# Patient Record
Sex: Female | Born: 1980 | Hispanic: Yes | Marital: Married | Smoking: Never smoker
Health system: Southern US, Community
[De-identification: ages and names within clinical notes are randomized; demographics above are authoritative.]

## PROBLEM LIST (undated history)

## (undated) ENCOUNTER — Inpatient Hospital Stay (HOSPITAL_COMMUNITY): Payer: Self-pay

## (undated) DIAGNOSIS — E119 Type 2 diabetes mellitus without complications: Secondary | ICD-10-CM

## (undated) DIAGNOSIS — N883 Incompetence of cervix uteri: Secondary | ICD-10-CM

## (undated) DIAGNOSIS — O24419 Gestational diabetes mellitus in pregnancy, unspecified control: Secondary | ICD-10-CM

## (undated) HISTORY — DX: Gestational diabetes mellitus in pregnancy, unspecified control: O24.419

## (undated) HISTORY — PX: CHOLECYSTECTOMY: SHX55

---

## 2008-01-20 ENCOUNTER — Inpatient Hospital Stay (HOSPITAL_COMMUNITY): Admission: AD | Admit: 2008-01-20 | Discharge: 2008-01-21 | Payer: Self-pay | Admitting: Obstetrics & Gynecology

## 2008-04-04 ENCOUNTER — Inpatient Hospital Stay (HOSPITAL_COMMUNITY): Admission: AD | Admit: 2008-04-04 | Discharge: 2008-04-04 | Payer: Self-pay | Admitting: Obstetrics & Gynecology

## 2008-05-16 ENCOUNTER — Inpatient Hospital Stay (HOSPITAL_COMMUNITY): Admission: AD | Admit: 2008-05-16 | Discharge: 2008-05-16 | Payer: Self-pay | Admitting: Family Medicine

## 2008-05-16 ENCOUNTER — Ambulatory Visit: Payer: Self-pay | Admitting: Advanced Practice Midwife

## 2008-10-09 ENCOUNTER — Ambulatory Visit: Payer: Self-pay | Admitting: Advanced Practice Midwife

## 2008-10-09 ENCOUNTER — Inpatient Hospital Stay (HOSPITAL_COMMUNITY): Admission: AD | Admit: 2008-10-09 | Discharge: 2008-10-10 | Payer: Self-pay | Admitting: Obstetrics and Gynecology

## 2008-10-18 ENCOUNTER — Inpatient Hospital Stay (HOSPITAL_COMMUNITY): Admission: RE | Admit: 2008-10-18 | Discharge: 2008-10-18 | Payer: Self-pay | Admitting: Obstetrics and Gynecology

## 2008-10-18 ENCOUNTER — Ambulatory Visit: Payer: Self-pay | Admitting: Obstetrics and Gynecology

## 2008-10-25 ENCOUNTER — Inpatient Hospital Stay (HOSPITAL_COMMUNITY): Admission: AD | Admit: 2008-10-25 | Discharge: 2008-10-25 | Payer: Self-pay | Admitting: Obstetrics & Gynecology

## 2008-10-28 ENCOUNTER — Inpatient Hospital Stay (HOSPITAL_COMMUNITY): Admission: AD | Admit: 2008-10-28 | Discharge: 2008-10-28 | Payer: Self-pay | Admitting: Obstetrics & Gynecology

## 2008-11-04 ENCOUNTER — Inpatient Hospital Stay (HOSPITAL_COMMUNITY): Admission: AD | Admit: 2008-11-04 | Discharge: 2008-11-04 | Payer: Self-pay | Admitting: Obstetrics and Gynecology

## 2009-03-26 ENCOUNTER — Emergency Department (HOSPITAL_COMMUNITY): Admission: EM | Admit: 2009-03-26 | Discharge: 2009-03-26 | Payer: Self-pay | Admitting: Emergency Medicine

## 2009-04-04 ENCOUNTER — Ambulatory Visit (HOSPITAL_COMMUNITY): Admission: EM | Admit: 2009-04-04 | Discharge: 2009-04-04 | Payer: Self-pay | Admitting: Emergency Medicine

## 2009-04-04 ENCOUNTER — Encounter (INDEPENDENT_AMBULATORY_CARE_PROVIDER_SITE_OTHER): Payer: Self-pay

## 2009-10-13 ENCOUNTER — Inpatient Hospital Stay (HOSPITAL_COMMUNITY): Admission: AD | Admit: 2009-10-13 | Discharge: 2009-10-13 | Payer: Self-pay | Admitting: Obstetrics

## 2009-11-24 ENCOUNTER — Inpatient Hospital Stay (HOSPITAL_COMMUNITY): Admission: AD | Admit: 2009-11-24 | Discharge: 2009-11-24 | Payer: Self-pay | Admitting: Obstetrics

## 2009-11-24 ENCOUNTER — Ambulatory Visit: Payer: Self-pay | Admitting: Obstetrics and Gynecology

## 2009-12-26 ENCOUNTER — Inpatient Hospital Stay (HOSPITAL_COMMUNITY)
Admission: AD | Admit: 2009-12-26 | Discharge: 2009-12-30 | Payer: Self-pay | Source: Home / Self Care | Admitting: Obstetrics

## 2009-12-30 ENCOUNTER — Encounter (INDEPENDENT_AMBULATORY_CARE_PROVIDER_SITE_OTHER): Payer: Self-pay | Admitting: Obstetrics

## 2010-01-16 ENCOUNTER — Inpatient Hospital Stay (HOSPITAL_COMMUNITY)
Admission: AD | Admit: 2010-01-16 | Discharge: 2010-01-16 | Payer: Self-pay | Source: Home / Self Care | Admitting: Obstetrics and Gynecology

## 2010-01-16 ENCOUNTER — Ambulatory Visit: Payer: Self-pay | Admitting: Nurse Practitioner

## 2010-01-17 DEATH — deceased

## 2010-05-31 LAB — URINE MICROSCOPIC-ADD ON

## 2010-05-31 LAB — CBC
Hemoglobin: 13.6 g/dL (ref 12.0–15.0)
Platelets: 304 10*3/uL (ref 150–400)
RBC: 4.45 MIL/uL (ref 3.87–5.11)
WBC: 8.5 10*3/uL (ref 4.0–10.5)

## 2010-05-31 LAB — COMPREHENSIVE METABOLIC PANEL
ALT: 46 U/L — ABNORMAL HIGH (ref 0–35)
AST: 55 U/L — ABNORMAL HIGH (ref 0–37)
Albumin: 3.9 g/dL (ref 3.5–5.2)
CO2: 24 mEq/L (ref 19–32)
Chloride: 104 mEq/L (ref 96–112)
GFR calc Af Amer: >60 mL/min (ref >60)
GFR calc non Af Amer: >60 mL/min (ref >60)
Sodium: 135 mEq/L (ref 135–145)
Total Bilirubin: 0.5 mg/dL (ref 0.3–1.2)

## 2010-05-31 LAB — URINALYSIS, ROUTINE W REFLEX MICROSCOPIC
Bilirubin Urine: NEGATIVE
Glucose, UA: NEGATIVE mg/dL
Specific Gravity, Urine: <1.005 — ABNORMAL LOW (ref 1.005–1.030)
pH: 5.5 (ref 5.0–8.0)

## 2010-06-01 LAB — CBC
HCT: 31.2 % — ABNORMAL LOW (ref 36.0–46.0)
HCT: 37.9 % (ref 36.0–46.0)
Hemoglobin: 10.9 g/dL — ABNORMAL LOW (ref 12.0–15.0)
Hemoglobin: 13 g/dL (ref 12.0–15.0)
MCHC: 34.4 g/dL (ref 30.0–36.0)
RBC: 3.39 MIL/uL — ABNORMAL LOW (ref 3.87–5.11)
RBC: 4.15 MIL/uL (ref 3.87–5.11)

## 2010-06-01 LAB — URINE MICROSCOPIC-ADD ON

## 2010-06-01 LAB — URINALYSIS, ROUTINE W REFLEX MICROSCOPIC
Bilirubin Urine: NEGATIVE
Glucose, UA: NEGATIVE mg/dL
Hgb urine dipstick: NEGATIVE
Ketones, ur: NEGATIVE mg/dL
Nitrite: NEGATIVE
Specific Gravity, Urine: <1.005 — ABNORMAL LOW (ref 1.005–1.030)
Specific Gravity, Urine: <1.005 — ABNORMAL LOW (ref 1.005–1.030)
Urobilinogen, UA: 0.2 mg/dL (ref 0.0–1.0)
pH: 6.5 (ref 5.0–8.0)
pH: 6.5 (ref 5.0–8.0)

## 2010-06-01 LAB — ABO/RH: ABO/RH(D): O POS

## 2010-06-01 LAB — MAGNESIUM: Magnesium: 5.6 mg/dL — ABNORMAL HIGH (ref 1.5–2.5)

## 2010-06-03 LAB — URINALYSIS, ROUTINE W REFLEX MICROSCOPIC
Glucose, UA: NEGATIVE mg/dL
Ketones, ur: NEGATIVE mg/dL
Leukocytes, UA: NEGATIVE
Specific Gravity, Urine: <1.005 — ABNORMAL LOW (ref 1.005–1.030)
pH: 6 (ref 5.0–8.0)

## 2010-06-03 LAB — GC/CHLAMYDIA PROBE AMP, GENITAL
Chlamydia, DNA Probe: NEGATIVE
GC Probe Amp, Genital: NEGATIVE

## 2010-06-03 LAB — URINE MICROSCOPIC-ADD ON

## 2010-06-04 LAB — COMPREHENSIVE METABOLIC PANEL
AST: 22 U/L (ref 0–37)
Albumin: 4.1 g/dL (ref 3.5–5.2)
Chloride: 104 mEq/L (ref 96–112)
Creatinine, Ser: 0.52 mg/dL (ref 0.4–1.2)
GFR calc Af Amer: >60 mL/min (ref >60)
Potassium: 3.8 mEq/L (ref 3.5–5.1)
Total Bilirubin: 0.5 mg/dL (ref 0.3–1.2)
Total Protein: 7.6 g/dL (ref 6.0–8.3)

## 2010-06-04 LAB — URINE MICROSCOPIC-ADD ON

## 2010-06-04 LAB — CBC
MCV: 89.4 fL (ref 78.0–100.0)
Platelets: 222 10*3/uL (ref 150–400)
RDW: 12.6 % (ref 11.5–15.5)
WBC: 8.5 10*3/uL (ref 4.0–10.5)

## 2010-06-04 LAB — URINALYSIS, ROUTINE W REFLEX MICROSCOPIC
Bilirubin Urine: NEGATIVE
Hgb urine dipstick: NEGATIVE
Specific Gravity, Urine: 1.022 (ref 1.005–1.030)
pH: 6 (ref 5.0–8.0)

## 2010-06-04 LAB — DIFFERENTIAL
Basophils Absolute: 0 10*3/uL (ref 0.0–0.1)
Eosinophils Relative: 1 % (ref 0–5)
Lymphocytes Relative: 24 % (ref 12–46)
Monocytes Absolute: 0.5 10*3/uL (ref 0.1–1.0)
Monocytes Relative: 5 % (ref 3–12)

## 2010-06-04 LAB — POCT PREGNANCY, URINE: Preg Test, Ur: NEGATIVE

## 2010-06-05 LAB — CBC
HCT: 38.5 % (ref 36.0–46.0)
Hemoglobin: 13.5 g/dL (ref 12.0–15.0)
MCHC: 35 g/dL (ref 30.0–36.0)
MCV: 88.4 fL (ref 78.0–100.0)
RBC: 4.36 MIL/uL (ref 3.87–5.11)
WBC: 8.9 10*3/uL (ref 4.0–10.5)

## 2010-06-05 LAB — COMPREHENSIVE METABOLIC PANEL
AST: 23 U/L (ref 0–37)
BUN: 11 mg/dL (ref 6–23)
CO2: 25 mEq/L (ref 19–32)
Calcium: 9.2 mg/dL (ref 8.4–10.5)
Chloride: 105 mEq/L (ref 96–112)
Creatinine, Ser: 0.47 mg/dL (ref 0.4–1.2)
GFR calc non Af Amer: >60 mL/min (ref >60)
Glucose, Bld: 107 mg/dL — ABNORMAL HIGH (ref 70–99)
Total Bilirubin: 0.5 mg/dL (ref 0.3–1.2)

## 2010-06-05 LAB — POCT PREGNANCY, URINE: Preg Test, Ur: NEGATIVE

## 2010-06-05 LAB — URINALYSIS, ROUTINE W REFLEX MICROSCOPIC
Bilirubin Urine: NEGATIVE
Glucose, UA: NEGATIVE mg/dL
Hgb urine dipstick: NEGATIVE
Specific Gravity, Urine: 1.025 (ref 1.005–1.030)
Urobilinogen, UA: 0.2 mg/dL (ref 0.0–1.0)
pH: 6.5 (ref 5.0–8.0)

## 2010-06-05 LAB — DIFFERENTIAL
Basophils Absolute: 0 10*3/uL (ref 0.0–0.1)
Eosinophils Absolute: 0.2 10*3/uL (ref 0.0–0.7)
Eosinophils Relative: 3 % (ref 0–5)
Lymphocytes Relative: 23 % (ref 12–46)
Lymphs Abs: 2.1 10*3/uL (ref 0.7–4.0)
Neutrophils Relative %: 69 % (ref 43–77)

## 2010-06-05 LAB — LIPASE, BLOOD: Lipase: 24 U/L (ref 11–59)

## 2010-06-24 ENCOUNTER — Inpatient Hospital Stay (HOSPITAL_COMMUNITY): Payer: Self-pay

## 2010-06-24 ENCOUNTER — Inpatient Hospital Stay (HOSPITAL_COMMUNITY)
Admission: AD | Admit: 2010-06-24 | Discharge: 2010-06-24 | Disposition: A | Discharge status: Home / Self Care | Payer: Self-pay | Source: Ambulatory Visit | Attending: Obstetrics & Gynecology | Admitting: Obstetrics & Gynecology

## 2010-06-24 DIAGNOSIS — O036 Delayed or excessive hemorrhage following complete or unspecified spontaneous abortion: Secondary | ICD-10-CM

## 2010-06-24 DIAGNOSIS — O209 Hemorrhage in early pregnancy, unspecified: Secondary | ICD-10-CM | POA: Insufficient documentation

## 2010-06-24 LAB — URINALYSIS, ROUTINE W REFLEX MICROSCOPIC
Bilirubin Urine: NEGATIVE
Glucose, UA: NEGATIVE mg/dL
Glucose, UA: NEGATIVE mg/dL
Hgb urine dipstick: NEGATIVE
Nitrite: NEGATIVE
Specific Gravity, Urine: 1.02 (ref 1.005–1.030)
pH: 6.5 (ref 5.0–8.0)
pH: 6.5 (ref 5.0–8.0)

## 2010-06-24 LAB — CBC
HCT: 41 % (ref 36.0–46.0)
HCT: 38.5 % (ref 36.0–46.0)
Hemoglobin: 13.4 g/dL (ref 12.0–15.0)
MCHC: 34.1 g/dL (ref 30.0–36.0)
MCV: 88.4 fL (ref 78.0–100.0)
MCV: 91.7 fL (ref 78.0–100.0)
Platelets: 224 10*3/uL (ref 150–400)
RBC: 4.17 MIL/uL (ref 3.87–5.11)
RBC: 4.2 MIL/uL (ref 3.87–5.11)
RDW: 13.3 % (ref 11.5–15.5)
WBC: 11.5 10*3/uL — ABNORMAL HIGH (ref 4.0–10.5)
WBC: 8.9 10*3/uL (ref 4.0–10.5)
WBC: 9.7 10*3/uL (ref 4.0–10.5)

## 2010-06-24 LAB — URINE MICROSCOPIC-ADD ON

## 2010-06-24 LAB — DIFFERENTIAL
Basophils Relative: 0 % (ref 0–1)
Lymphocytes Relative: 25 % (ref 12–46)
Lymphs Abs: 2.2 10*3/uL (ref 0.7–4.0)
Monocytes Relative: 8 % (ref 3–12)
Neutro Abs: 5.8 10*3/uL (ref 1.7–7.7)
Neutrophils Relative %: 65 % (ref 43–77)

## 2010-06-24 LAB — HCG, QUANTITATIVE, PREGNANCY: hCG, Beta Chain, Quant, S: 75 m[IU]/mL — ABNORMAL HIGH (ref <5)

## 2010-06-24 LAB — POCT PREGNANCY, URINE: Preg Test, Ur: POSITIVE

## 2010-06-25 LAB — URINALYSIS, ROUTINE W REFLEX MICROSCOPIC
Ketones, ur: NEGATIVE mg/dL
Nitrite: NEGATIVE
Specific Gravity, Urine: <1.005 — ABNORMAL LOW (ref 1.005–1.030)
pH: 6 (ref 5.0–8.0)

## 2010-06-25 LAB — WET PREP, GENITAL: Yeast Wet Prep HPF POC: NONE SEEN

## 2010-06-25 LAB — HCG, QUANTITATIVE, PREGNANCY: hCG, Beta Chain, Quant, S: 31188 m[IU]/mL — ABNORMAL HIGH (ref <5)

## 2010-06-26 LAB — GC/CHLAMYDIA PROBE AMP, GENITAL: GC Probe Amp, Genital: NEGATIVE

## 2010-07-03 LAB — URINALYSIS, ROUTINE W REFLEX MICROSCOPIC
Bilirubin Urine: NEGATIVE
Glucose, UA: NEGATIVE mg/dL
Specific Gravity, Urine: <1.005 — ABNORMAL LOW (ref 1.005–1.030)
pH: 6.5 (ref 5.0–8.0)

## 2010-07-03 LAB — GC/CHLAMYDIA PROBE AMP, GENITAL: GC Probe Amp, Genital: NEGATIVE

## 2010-07-03 LAB — CBC
HCT: 34.9 % — ABNORMAL LOW (ref 36.0–46.0)
Hemoglobin: 11.9 g/dL — ABNORMAL LOW (ref 12.0–15.0)
RBC: 3.96 MIL/uL (ref 3.87–5.11)
WBC: 9.2 10*3/uL (ref 4.0–10.5)

## 2010-07-03 LAB — URINE MICROSCOPIC-ADD ON

## 2010-07-03 LAB — WET PREP, GENITAL
Trich, Wet Prep: NONE SEEN
Yeast Wet Prep HPF POC: NONE SEEN

## 2010-07-04 LAB — URINE MICROSCOPIC-ADD ON

## 2010-07-04 LAB — URINALYSIS, ROUTINE W REFLEX MICROSCOPIC
Glucose, UA: NEGATIVE mg/dL
Leukocytes, UA: NEGATIVE
Nitrite: NEGATIVE
pH: 6 (ref 5.0–8.0)

## 2010-07-05 ENCOUNTER — Other Ambulatory Visit: Payer: Self-pay | Admitting: Obstetrics and Gynecology

## 2010-07-05 DIAGNOSIS — Z331 Pregnant state, incidental: Secondary | ICD-10-CM

## 2010-07-05 LAB — POCT URINALYSIS DIP (DEVICE)
Bilirubin Urine: NEGATIVE
Hgb urine dipstick: NEGATIVE
Nitrite: NEGATIVE
Specific Gravity, Urine: 1.01 (ref 1.005–1.030)
Urobilinogen, UA: 0.2 mg/dL (ref 0.0–1.0)
pH: 5 (ref 5.0–8.0)

## 2010-07-20 ENCOUNTER — Other Ambulatory Visit: Payer: Self-pay | Admitting: Obstetrics and Gynecology

## 2010-07-20 DIAGNOSIS — Z331 Pregnant state, incidental: Secondary | ICD-10-CM

## 2010-07-20 DIAGNOSIS — O09299 Supervision of pregnancy with other poor reproductive or obstetric history, unspecified trimester: Secondary | ICD-10-CM

## 2010-07-20 LAB — POCT URINALYSIS DIP (DEVICE)
Hgb urine dipstick: NEGATIVE
Ketones, ur: NEGATIVE mg/dL
Protein, ur: NEGATIVE mg/dL
pH: 7 (ref 5.0–8.0)

## 2010-07-24 ENCOUNTER — Ambulatory Visit (HOSPITAL_COMMUNITY)
Admission: RE | Admit: 2010-07-24 | Discharge: 2010-07-24 | Disposition: A | Discharge status: Home / Self Care | Payer: Self-pay | Source: Ambulatory Visit

## 2010-07-24 ENCOUNTER — Other Ambulatory Visit: Payer: Self-pay | Admitting: Obstetrics and Gynecology

## 2010-07-24 ENCOUNTER — Ambulatory Visit (HOSPITAL_COMMUNITY)
Admission: RE | Admit: 2010-07-24 | Discharge: 2010-07-24 | Disposition: A | Discharge status: Home / Self Care | Payer: Self-pay | Source: Ambulatory Visit | Attending: Obstetrics and Gynecology | Admitting: Obstetrics and Gynecology

## 2010-07-24 ENCOUNTER — Ambulatory Visit (HOSPITAL_COMMUNITY): Admission: RE | Admit: 2010-07-24 | Payer: Self-pay | Source: Ambulatory Visit

## 2010-07-24 DIAGNOSIS — O2620 Pregnancy care for patient with recurrent pregnancy loss, unspecified trimester: Secondary | ICD-10-CM | POA: Insufficient documentation

## 2010-07-24 DIAGNOSIS — Z362 Encounter for other antenatal screening follow-up: Secondary | ICD-10-CM

## 2010-07-24 DIAGNOSIS — O26879 Cervical shortening, unspecified trimester: Secondary | ICD-10-CM

## 2010-07-24 DIAGNOSIS — Z3689 Encounter for other specified antenatal screening: Secondary | ICD-10-CM | POA: Insufficient documentation

## 2010-07-24 DIAGNOSIS — Z0489 Encounter for examination and observation for other specified reasons: Secondary | ICD-10-CM

## 2010-08-17 ENCOUNTER — Other Ambulatory Visit: Payer: Self-pay | Admitting: Obstetrics and Gynecology

## 2010-08-17 DIAGNOSIS — Z331 Pregnant state, incidental: Secondary | ICD-10-CM

## 2010-08-17 DIAGNOSIS — O2620 Pregnancy care for patient with recurrent pregnancy loss, unspecified trimester: Secondary | ICD-10-CM

## 2010-08-17 LAB — POCT URINALYSIS DIP (DEVICE)
Protein, ur: NEGATIVE mg/dL
Urobilinogen, UA: 0.2 mg/dL (ref 0.0–1.0)

## 2010-09-07 ENCOUNTER — Other Ambulatory Visit: Payer: Self-pay | Admitting: Obstetrics & Gynecology

## 2010-09-07 DIAGNOSIS — O09219 Supervision of pregnancy with history of pre-term labor, unspecified trimester: Secondary | ICD-10-CM

## 2010-09-07 LAB — POCT URINALYSIS DIP (DEVICE)
Glucose, UA: NEGATIVE mg/dL
Nitrite: NEGATIVE
Protein, ur: NEGATIVE mg/dL
Urobilinogen, UA: 0.2 mg/dL (ref 0.0–1.0)

## 2010-09-11 ENCOUNTER — Ambulatory Visit (HOSPITAL_COMMUNITY)
Admission: RE | Admit: 2010-09-11 | Discharge: 2010-09-11 | Disposition: A | Discharge status: Home / Self Care | Payer: Self-pay | Source: Ambulatory Visit | Attending: Obstetrics and Gynecology | Admitting: Obstetrics and Gynecology

## 2010-09-11 ENCOUNTER — Observation Stay (HOSPITAL_COMMUNITY)
Admission: AD | Admit: 2010-09-11 | Discharge: 2010-09-12 | Payer: Self-pay | Source: Ambulatory Visit | Attending: Obstetrics & Gynecology | Admitting: Obstetrics & Gynecology

## 2010-09-11 ENCOUNTER — Encounter (HOSPITAL_COMMUNITY): Payer: Self-pay

## 2010-09-11 DIAGNOSIS — Z0489 Encounter for examination and observation for other specified reasons: Secondary | ICD-10-CM

## 2010-09-11 DIAGNOSIS — O358XX0 Maternal care for other (suspected) fetal abnormality and damage, not applicable or unspecified: Secondary | ICD-10-CM | POA: Insufficient documentation

## 2010-09-11 DIAGNOSIS — O3430 Maternal care for cervical incompetence, unspecified trimester: Principal | ICD-10-CM | POA: Insufficient documentation

## 2010-09-11 DIAGNOSIS — IMO0002 Reserved for concepts with insufficient information to code with codable children: Secondary | ICD-10-CM

## 2010-09-11 DIAGNOSIS — O2620 Pregnancy care for patient with recurrent pregnancy loss, unspecified trimester: Secondary | ICD-10-CM | POA: Insufficient documentation

## 2010-09-11 DIAGNOSIS — Z1389 Encounter for screening for other disorder: Secondary | ICD-10-CM | POA: Insufficient documentation

## 2010-09-11 DIAGNOSIS — O09299 Supervision of pregnancy with other poor reproductive or obstetric history, unspecified trimester: Secondary | ICD-10-CM | POA: Insufficient documentation

## 2010-09-11 DIAGNOSIS — O26879 Cervical shortening, unspecified trimester: Secondary | ICD-10-CM

## 2010-09-11 DIAGNOSIS — Z363 Encounter for antenatal screening for malformations: Secondary | ICD-10-CM | POA: Insufficient documentation

## 2010-09-11 LAB — DIFFERENTIAL
Basophils Absolute: 0 10*3/uL (ref 0.0–0.1)
Basophils Relative: 0 % (ref 0–1)
Eosinophils Absolute: 0.2 10*3/uL (ref 0.0–0.7)
Lymphs Abs: 2 10*3/uL (ref 0.7–4.0)
Neutrophils Relative %: 72 % (ref 43–77)

## 2010-09-11 LAB — URINALYSIS, ROUTINE W REFLEX MICROSCOPIC
Ketones, ur: NEGATIVE mg/dL
Protein, ur: NEGATIVE mg/dL
Urobilinogen, UA: 0.2 mg/dL (ref 0.0–1.0)

## 2010-09-11 LAB — URINE MICROSCOPIC-ADD ON

## 2010-09-11 LAB — WET PREP, GENITAL
Clue Cells Wet Prep HPF POC: NONE SEEN
Trich, Wet Prep: NONE SEEN
Yeast Wet Prep HPF POC: NONE SEEN

## 2010-09-11 LAB — CBC
Platelets: 200 10*3/uL (ref 150–400)
RBC: 4.29 MIL/uL (ref 3.87–5.11)
RDW: 12.8 % (ref 11.5–15.5)
WBC: 10.3 10*3/uL (ref 4.0–10.5)

## 2010-09-12 LAB — GC/CHLAMYDIA PROBE AMP, GENITAL: GC Probe Amp, Genital: NEGATIVE

## 2010-09-13 NOTE — Discharge Summary (Signed)
  NAMEDORIS, Aimee Benjamin           ACCOUNT NO.:  [id number]  MEDICAL RECORD NO.:  [id number]  LOCATION:  9311                          FACILITY:  [facility]  PHYSICIAN:  Catalina Antigua, MD     DATE OF BIRTH:  1981/02/03  DATE OF ADMISSION:  09/11/2010 DATE OF DISCHARGE:  09/12/2010                              DISCHARGE SUMMARY   ADMISSION DIAGNOSIS:  Incompetent cervix.  DISCHARGE DIAGNOSIS:  Incompetent cervix.  HOSPITAL COURSE:  This is a 30 year old G5, P 0-0-4-0 with a history of three first trimester spontaneous abortions and 21-week fetal loss secondary to PPROM and preterm labor who was currently receiving 17- hydroxyprogesterone therapy per protocol, who was diagnosed with incompetent cervix on a routine anatomy scan.  The patient was kept overnight for observation for signs of chorioamnionitis and further evaluation for possible rescue cerclage placement.  On admission, the patient was found to have to be afebrile with a white count of 10. uterine monitoring demonstrated no uterine activity.  The patient remained afebrile throughout her stay.  Initial physical exam demonstrated a cervix to be approximately 1-2 cm dilated with membranes visible at the level of the external os.  On day of transfer, physical exam was essentially unchanged and the patient remained afebrile throughout her stay.  After further consultation with Dr. Rica Koyanagi [facility]o [facility], decision was made to transfer the patient to [facility] for possible rescue cerclage placement with her as the accepting physician.  The patient was agreeable with this plan of care and was transferred in stable condition.  Risks, benefits and alternatives were explained to the patient and the patient verbalized understanding.  Patient counseling was made in the presence of Spanish interpreter Eda Royal.     Catalina Antigua, MD     PC/MEDQ  D:  09/12/2010  T:  09/13/2010  Job:  [id number]  Electronically  Signed by Catalina Antigua  on 09/13/2010 09:11:38 AM

## 2010-09-14 ENCOUNTER — Ambulatory Visit: Payer: Self-pay

## 2010-09-27 ENCOUNTER — Encounter: Payer: Self-pay | Admitting: Obstetrics & Gynecology

## 2010-09-27 ENCOUNTER — Ambulatory Visit (INDEPENDENT_AMBULATORY_CARE_PROVIDER_SITE_OTHER): Payer: Self-pay | Admitting: Obstetrics & Gynecology

## 2010-09-27 VITALS — BP 130/82 | HR 92 | Temp 98.3°F | Ht 60.25 in | Wt 151.9 lb

## 2010-09-27 DIAGNOSIS — N96 Recurrent pregnancy loss: Secondary | ICD-10-CM

## 2010-09-27 NOTE — Progress Notes (Signed)
  30 yo Hispanic G5P0140 status post NSVD of 17 week demise 2 weeks ago.  She denies depression and is unsure if she and her husband wish to try for another pregnancy, but she declines birth control at this time.  She denies pain or unusual bleeding.  PE:  EG: normal CVX: normal Uterus: 8 week size, non-tender Adnexa: non-tender, no masses  A/P:  Recurrent pregnancy losses, first and second trimester.  Stable.  I have stress that she use abstinence for the next month and then condoms prn.  She knows to contact her OB provider as soon as she is aware that she is pregnant as she may benefit from vaginal progesterone and/or a cerclage.

## 2010-12-19 LAB — URINALYSIS, ROUTINE W REFLEX MICROSCOPIC
Ketones, ur: NEGATIVE
Leukocytes, UA: NEGATIVE
Protein, ur: NEGATIVE
Urobilinogen, UA: 0.2

## 2010-12-19 LAB — URINE MICROSCOPIC-ADD ON

## 2010-12-19 LAB — CBC
MCV: 85
RBC: 4.43
RDW: 15.6 — ABNORMAL HIGH
WBC: 13.4 — ABNORMAL HIGH

## 2010-12-19 LAB — WET PREP, GENITAL
Clue Cells Wet Prep HPF POC: NONE SEEN
Trich, Wet Prep: NONE SEEN
Yeast Wet Prep HPF POC: NONE SEEN

## 2010-12-19 LAB — GC/CHLAMYDIA PROBE AMP, GENITAL: GC Probe Amp, Genital: NEGATIVE

## 2012-03-19 NOTE — L&D Delivery Note (Signed)
Delivery Note At 9:13 PM a viable female was delivered via  (Presentation: ;  ).  APGAR: , ; weight .   Placenta status: , .  Cord:  with the following complications: .  Cord pH: not done  Anesthesia: Epidural  Episiotomy:  Lacerations:  Suture Repair: 2.0 vicryl Est. Blood Loss (mL):   Mom to postpartum.  Baby to Couplet care / Skin to Skin.  Arnol Mcgibbon A 02/16/2013, 9:28 PM

## 2012-07-22 LAB — OB RESULTS CONSOLE GC/CHLAMYDIA
Chlamydia: NEGATIVE
Gonorrhea: NEGATIVE

## 2012-07-22 LAB — OB RESULTS CONSOLE HEPATITIS B SURFACE ANTIGEN: Hepatitis B Surface Ag: NEGATIVE

## 2012-07-22 LAB — OB RESULTS CONSOLE ANTIBODY SCREEN: Antibody Screen: NEGATIVE

## 2012-07-22 LAB — OB RESULTS CONSOLE RUBELLA ANTIBODY, IGM: Rubella: IMMUNE

## 2012-08-21 ENCOUNTER — Other Ambulatory Visit: Payer: Self-pay | Admitting: Obstetrics

## 2012-08-22 NOTE — H&P (Signed)
NAME:  Aimee Benjamin, Aimee Benjamin NO.:  [id number]  MEDICAL RECORD NO.:  [id number]  LOCATION:                                 FACILITY:  PHYSICIAN:  [name], M.D.   DATE OF BIRTH:  DATE OF ADMISSION: DATE OF DISCHARGE:                             HISTORY & PHYSICAL   HISTORY OF PRESENT ILLNESS:  This is a 32 year old gravida 5, para 0-0-4- 0.  Her EDC is February 25, 2013.  She has had 4 spontaneous abortions, the last one at 19 weeks when she was admitted with bulging membranes and a history of an incompetent cervix, and she is now scheduled for a cervical cerclage.  PAST MEDICAL HISTORY:  As above.  PAST SURGICAL HISTORY:  Negative.  SOCIAL HISTORY:  Negative.  SYSTEM REVIEW:  She has allergies to ASPIRIN and PENICILLIN.  PHYSICAL EXAMINATION:  GENERAL:  Well-developed female, in no distress. HEENT:  Negative. LUNGS:  Clear to P and A. HEART:  Regular rhythm.  No murmurs, no gallops. ABDOMEN:  Negative.  Her uterus is 14 weeks size with a fetal heart of 160.  Cervix closed. EXTREMITIES:  Negative.          ______________________________ [name], M.D.     BAM/MEDQ  D:  08/22/2012  T:  08/22/2012  Job:  [id number]

## 2012-08-27 ENCOUNTER — Encounter (HOSPITAL_COMMUNITY): Payer: Self-pay | Admitting: Anesthesiology

## 2012-08-27 ENCOUNTER — Encounter (HOSPITAL_COMMUNITY): Payer: Self-pay

## 2012-08-27 ENCOUNTER — Encounter (HOSPITAL_COMMUNITY)
Admission: RE | Disposition: A | Discharge status: Home / Self Care | Payer: Self-pay | Source: Ambulatory Visit | Attending: Obstetrics

## 2012-08-27 ENCOUNTER — Ambulatory Visit (HOSPITAL_COMMUNITY): Payer: Self-pay | Admitting: Anesthesiology

## 2012-08-27 ENCOUNTER — Ambulatory Visit (HOSPITAL_COMMUNITY)
Admission: RE | Admit: 2012-08-27 | Discharge: 2012-08-27 | Disposition: A | Discharge status: Home / Self Care | Payer: Self-pay | Source: Ambulatory Visit | Attending: Obstetrics | Admitting: Obstetrics

## 2012-08-27 ENCOUNTER — Inpatient Hospital Stay (HOSPITAL_COMMUNITY)
Admission: AD | Admit: 2012-08-27 | Discharge: 2012-08-27 | Disposition: A | Discharge status: Home / Self Care | Payer: Self-pay | Source: Ambulatory Visit | Attending: Obstetrics | Admitting: Obstetrics

## 2012-08-27 DIAGNOSIS — R339 Retention of urine, unspecified: Secondary | ICD-10-CM | POA: Insufficient documentation

## 2012-08-27 DIAGNOSIS — O3430 Maternal care for cervical incompetence, unspecified trimester: Secondary | ICD-10-CM | POA: Insufficient documentation

## 2012-08-27 DIAGNOSIS — N9989 Other postprocedural complications and disorders of genitourinary system: Secondary | ICD-10-CM | POA: Insufficient documentation

## 2012-08-27 DIAGNOSIS — Y838 Other surgical procedures as the cause of abnormal reaction of the patient, or of later complication, without mention of misadventure at the time of the procedure: Secondary | ICD-10-CM | POA: Insufficient documentation

## 2012-08-27 DIAGNOSIS — IMO0002 Reserved for concepts with insufficient information to code with codable children: Secondary | ICD-10-CM | POA: Insufficient documentation

## 2012-08-27 HISTORY — PX: CERVICAL CERCLAGE: SHX1329

## 2012-08-27 LAB — CBC
HCT: 39.2 % (ref 36.0–46.0)
MCV: 87.7 fL (ref 78.0–100.0)
RBC: 4.47 MIL/uL (ref 3.87–5.11)
RDW: 12.7 % (ref 11.5–15.5)
WBC: 10.3 10*3/uL (ref 4.0–10.5)

## 2012-08-27 SURGERY — CERCLAGE, CERVIX, VAGINAL APPROACH
Anesthesia: Spinal | Site: Cervix | Wound class: Clean Contaminated

## 2012-08-27 MED ORDER — ONDANSETRON HCL 4 MG/2ML IJ SOLN: 4.0000 mg | Freq: Once | INTRAMUSCULAR | Status: DC | PRN

## 2012-08-27 MED ORDER — LACTATED RINGERS IV SOLN
INTRAVENOUS | Status: DC
  Administered 2012-08-27 (×2): via INTRAVENOUS

## 2012-08-27 MED ORDER — FENTANYL CITRATE 0.05 MG/ML IJ SOLN
INTRAMUSCULAR | Status: AC
  Administered 2012-08-27: 50 ug via INTRAVENOUS
  Filled 2012-08-27: qty 2

## 2012-08-27 MED ORDER — BUPIVACAINE IN DEXTROSE 0.75-8.25 % IT SOLN
INTRATHECAL | Status: DC | PRN
  Administered 2012-08-27: 1 mL via INTRATHECAL

## 2012-08-27 MED ORDER — TAMSULOSIN HCL 0.4 MG PO CAPS: 0.4000 mg | ORAL_CAPSULE | Freq: Every day | ORAL | Status: DC

## 2012-08-27 MED ORDER — TAMSULOSIN HCL 0.4 MG PO CAPS
0.4000 mg | ORAL_CAPSULE | Freq: Once | ORAL | Status: AC
  Administered 2012-08-27: 0.4 mg via ORAL
  Filled 2012-08-27: qty 1

## 2012-08-27 MED ORDER — MEPERIDINE HCL 25 MG/ML IJ SOLN: 6.2500 mg | INTRAMUSCULAR | Status: DC | PRN

## 2012-08-27 MED ORDER — FENTANYL CITRATE 0.05 MG/ML IJ SOLN
25.0000 ug | INTRAMUSCULAR | Status: DC | PRN
  Administered 2012-08-27 (×2): 50 ug via INTRAVENOUS

## 2012-08-27 SURGICAL SUPPLY — 16 items
CATH ROBINSON RED A/P 16FR (CATHETERS) ×2 IMPLANT
CLOTH BEACON ORANGE TIMEOUT ST (SAFETY) ×2 IMPLANT
COUNTER NEEDLE 1200 MAGNETIC (NEEDLE) IMPLANT
GLOVE BIO SURGEON STRL SZ8.5 (GLOVE) ×2 IMPLANT
GOWN PREVENTION PLUS XXLARGE (GOWN DISPOSABLE) ×2 IMPLANT
GOWN STRL REIN XL XLG (GOWN DISPOSABLE) ×4 IMPLANT
NEEDLE MA TROC 1/2 (NEEDLE) IMPLANT
NEEDLE MAYO .5 CIRCLE (NEEDLE) ×2 IMPLANT
PACK VAGINAL MINOR WOMEN LF (CUSTOM PROCEDURE TRAY) ×2 IMPLANT
PAD OB MATERNITY 4.3X12.25 (PERSONAL CARE ITEMS) ×2 IMPLANT
PAD PREP 24X48 CUFFED NSTRL (MISCELLANEOUS) ×2 IMPLANT
SUT MERSILENE 5MM BP 1 12 (SUTURE) ×2 IMPLANT
TOWEL OR 17X24 6PK STRL BLUE (TOWEL DISPOSABLE) ×4 IMPLANT
TUBING NON-CON 1/4 X 20 CONN (TUBING) IMPLANT
WATER STERILE IRR 1000ML POUR (IV SOLUTION) ×2 IMPLANT
YANKAUER SUCT BULB TIP NO VENT (SUCTIONS) IMPLANT

## 2012-08-27 NOTE — MAU Provider Note (Signed)
History     CSN: [id number]  Arrival date and time: 08/27/12 1913   None     No chief complaint on file.  HPI Aimee Benjamin is 32 y.o. G5P0040 [redacted]w[redacted]d weeks presenting with urinary retention post-op cerclage this am by Dr. Gaynell Face.  She denies UTI sxs, fevers, or chills.  She was discharged around 3pm.  She was unable to urinate at the time of discharge and states they did "the same thing before I left". Prior to admission to MAU, she became uncomfortable with pressure in the lower mid abdomen.      No past medical history on file.  Past Surgical History  Procedure Laterality Date  . Cholecystectomy      Family History  Problem Relation Age of Onset  . Diabetes Mother     History  Substance Use Topics  . Smoking status: Never Smoker   . Smokeless tobacco: Never Used  . Alcohol Use: No    Allergies:  Allergies  Allergen Reactions  . Aspirin   . Penicillins     Prescriptions prior to admission  Medication Sig Dispense Refill  . Prenatal Vit-Fe Psac Cmplx-FA (PRENATAL MULTIVITAMIN) 60-1 MG tablet Take 1 tablet by mouth daily.          Review of Systems  Constitutional: Negative for fever and chills.  Genitourinary: Negative for dysuria, urgency, frequency, hematuria and flank pain.       Urinary retention   Physical Exam   Blood pressure 140/86, pulse 108, temperature 98.6 F (37 C), temperature source Oral, resp. rate 18, height 5' (1.524 m), SpO2 100.00%, unknown if currently breastfeeding.  Physical Exam  Constitutional: She is oriented to person, place, and time. She appears well-developed and well-nourished. No distress.  HENT:  Head: Normocephalic.  Neurological: She is alert and oriented to person, place, and time.  Skin: Skin is warm and dry.  Psychiatric: She has a normal mood and affect. Her behavior is normal.    MAU Course  Procedures    I & O catheterization completed with 700cc obtained.                        Flomax 0.4mg  tab po X 1  given in MAU  MDM Reported HPI to Dr. Gaynell Face.  Order given to perform In and Out catherization and call with urine amount.    Patient feels much better after catheterization.  Reported to Dr. Fanny Dance given for Flomax 0.4mg  po now and send home with a Rx for Flomax 0.4mg  1 tab qd X 3 days.  Instruct patient to go to [facility] or pharmacy and buy 2 I&O catheters--if she is unable to urinate on her own in 6 hrs, to self cath.  Repeat in 6 hrs in again unable to urinate.  Call Dr. Gaynell Face tomorrow in am if continues with retention. I discussed self catheterization with the patient and the interpreter.  Patient is reluctant.  I encouraged her to try.    Assessment and Plan  A:  Post-operative urinary retention      Cerclage for repeated spontaneous miscarriages  P:  Patient instructed to self cath in 6 hrs if unable to urinate. Instructed to purchase 2 I&O caths at drug store.       Rx for Flomax 0.4mg  tabs 1 po qd X 3      Follow up with Dr. Gaynell Face      Return to MAU tonight if unable to self cath or becomes  uncomfortable if unable to urinate  KEY,EVE M 08/27/2012, 7:53 PM

## 2012-08-27 NOTE — Op Note (Signed)
And preop diagnosis incompetent cervix [redacted] weeks pregnant Postop diagnosis the same Procedure placement of cervical cerclage Anesthesia spinal Surgeon Dr. Francoise Ceo Procedure after the spinal was placed patient in lithotomy position perineum and vagina prepped and draped bladder emptied with a straight catheter the cervix was noted to be closed and short and a weighted speculum placed in the vagina and the cervix grasped at 12:00 with a sponge forcep using a #5 Mersilene band and starting at 12:00 cerclage was placed high up on the cervix and tied at 6:00 in the usual manner the patient tolerated the procedure well taken to recovery room in good condition

## 2012-08-27 NOTE — Anesthesia Preprocedure Evaluation (Signed)
Anesthesia Evaluation  Patient identified by MRN, date of birth, ID band Patient awake    Reviewed: Allergy & Precautions, H&P , NPO status , Patient's Chart, lab work & pertinent test results  Airway Mallampati: II TM Distance: >3 FB Neck ROM: full    Dental no notable dental hx.    Pulmonary neg pulmonary ROS,    Pulmonary exam normal       Cardiovascular negative cardio ROS      Neuro/Psych negative neurological ROS  negative psych ROS   GI/Hepatic negative GI ROS, Neg liver ROS,   Endo/Other  negative endocrine ROS  Renal/GU negative Renal ROS  negative genitourinary   Musculoskeletal negative musculoskeletal ROS (+)   Abdominal Normal abdominal exam  (+)   Peds negative pediatric ROS (+)  Hematology negative hematology ROS (+)   Anesthesia Other Findings   Reproductive/Obstetrics (+) Pregnancy                           Anesthesia Physical Anesthesia Plan  ASA: II  Anesthesia Plan: Spinal   Post-op Pain Management:    Induction:   Airway Management Planned:   Additional Equipment:   Intra-op Plan:   Post-operative Plan:   Informed Consent: I have reviewed the patients History and Physical, chart, labs and discussed the procedure including the risks, benefits and alternatives for the proposed anesthesia with the patient or authorized representative who has indicated his/her understanding and acceptance.     Plan Discussed with: CRNA and Surgeon  Anesthesia Plan Comments:         Anesthesia Quick Evaluation  

## 2012-08-27 NOTE — Anesthesia Postprocedure Evaluation (Signed)
Anesthesia Post Note  Patient: Aimee Benjamin  Procedure(s) Performed: Procedure(s) (LRB): CERCLAGE CERVICAL (N/A)  Anesthesia type: Spinal  Patient location: PACU  Post pain: Pain level controlled  Post assessment: Post-op Vital signs reviewed  Last Vitals:  Filed Vitals:   08/27/12 1138  BP: 111/55  Pulse: 81  Temp: 36.8 C  Resp: 25    Post vital signs: Reviewed  Level of consciousness: awake  Complications: No apparent anesthesia complications

## 2012-08-27 NOTE — Progress Notes (Signed)
Eve key, np notified of i&o cath urine output

## 2012-08-27 NOTE — Anesthesia Procedure Notes (Signed)
Spinal  Patient location during procedure: OR Start time: 08/27/2012 10:59 AM End time: 08/27/2012 11:01 AM Staffing Anesthesiologist: Sandrea Hughs Performed by: anesthesiologist  Preanesthetic Checklist Completed: patient identified, site marked, surgical consent, pre-op evaluation, timeout performed, IV checked, risks and benefits discussed and monitors and equipment checked Spinal Block Patient position: sitting Prep: DuraPrep Patient monitoring: heart rate, cardiac monitor, continuous pulse ox and blood pressure Approach: midline Location: L3-4 Injection technique: single-shot Needle Needle type: Sprotte and Pencan  Needle gauge: 24 G Needle length: 9 cm Needle insertion depth: 5 cm Assessment Sensory level: T12

## 2012-08-27 NOTE — Transfer of Care (Signed)
Immediate Anesthesia Transfer of Care Note  Patient: Aimee Benjamin  Procedure(s) Performed: Procedure(s): CERCLAGE CERVICAL (N/A)  Patient Location: PACU  Anesthesia Type:Spinal  Level of Consciousness: awake, alert  and patient cooperative  Airway & Oxygen Therapy: Patient Spontanous Breathing  Post-op Assessment: Report given to PACU RN and Post -op Vital signs reviewed and stable  Post vital signs: Reviewed and stable  Complications: No apparent anesthesia complications

## 2012-08-27 NOTE — H&P (Signed)
  There has been no change in the patient's history and physical since the original dictation

## 2012-08-27 NOTE — Progress Notes (Signed)
Eve key, np notified of patient c/o unable to void since cerclage placement at 12pm today. She states that dr Gaynell Face asked for patient to attempt to void in bathroom, if she is unable to. Then remove urine by using i&o cath.

## 2012-08-28 ENCOUNTER — Encounter (HOSPITAL_COMMUNITY): Payer: Self-pay | Admitting: Obstetrics

## 2012-08-31 ENCOUNTER — Inpatient Hospital Stay (HOSPITAL_COMMUNITY)
Admission: AD | Admit: 2012-08-31 | Discharge: 2012-08-31 | Disposition: A | Discharge status: Home / Self Care | Payer: Self-pay | Source: Ambulatory Visit | Attending: Obstetrics | Admitting: Obstetrics

## 2012-08-31 ENCOUNTER — Encounter (HOSPITAL_COMMUNITY): Payer: Self-pay | Admitting: Advanced Practice Midwife

## 2012-08-31 DIAGNOSIS — L293 Anogenital pruritus, unspecified: Secondary | ICD-10-CM | POA: Insufficient documentation

## 2012-08-31 DIAGNOSIS — R300 Dysuria: Secondary | ICD-10-CM | POA: Insufficient documentation

## 2012-08-31 DIAGNOSIS — N390 Urinary tract infection, site not specified: Secondary | ICD-10-CM | POA: Insufficient documentation

## 2012-08-31 DIAGNOSIS — O2390 Unspecified genitourinary tract infection in pregnancy, unspecified trimester: Secondary | ICD-10-CM | POA: Insufficient documentation

## 2012-08-31 DIAGNOSIS — O3430 Maternal care for cervical incompetence, unspecified trimester: Secondary | ICD-10-CM | POA: Insufficient documentation

## 2012-08-31 LAB — URINALYSIS, ROUTINE W REFLEX MICROSCOPIC
Protein, ur: NEGATIVE mg/dL
Urobilinogen, UA: 0.2 mg/dL (ref 0.0–1.0)

## 2012-08-31 LAB — URINE MICROSCOPIC-ADD ON

## 2012-08-31 LAB — WET PREP, GENITAL: Yeast Wet Prep HPF POC: NONE SEEN

## 2012-08-31 MED ORDER — SULFAMETHOXAZOLE-TRIMETHOPRIM 800-160 MG PO TABS: 1.0000 | ORAL_TABLET | Freq: Two times a day (BID) | ORAL | Status: DC

## 2012-08-31 NOTE — MAU Note (Signed)
Pt reports she feels like she has a "vaginal" infection. Having burning with urination  and itching. Pt her several days ago for urinary retention. Burning started on Wed.

## 2012-08-31 NOTE — MAU Provider Note (Signed)
History     CSN: [id number]  Arrival date and time: 08/31/12 4098   First Provider Initiated Contact with Patient 08/31/12 1015      Chief Complaint  Patient presents with  . Vaginitis   HPI THis is a 32 y.o. female at [redacted]w[redacted]d who presents with c/o burning and itching "where urine comes out".  Phone translator used. Denies fever or back pain. States it feels like there is a little urine left in bladder after voiding.  She recently had a Cerclage placed for cervical incompetence.  She has had several miscarriages and two were in second trimester. Denies bleeding.   RN Note:  Patient presents to MAU with c/o urinary burning and pain since Wednesday; denies vaginal discharge. Denies fever, back pain.  Reports she feels bladder is not completely emptying.        OB History   Grav Para Term Preterm Abortions TAB SAB Ect Mult Living   6 1  1 4  4          History reviewed. No pertinent past medical history.  Past Surgical History  Procedure Laterality Date  . Cholecystectomy    . Cervical cerclage N/A 08/27/2012    Procedure: CERCLAGE CERVICAL;  Surgeon: [name], MD;  Location: [facility];  Service: Gynecology;  Laterality: N/A;    Family History  Problem Relation Age of Onset  . Diabetes Mother     History  Substance Use Topics  . Smoking status: Never Smoker   . Smokeless tobacco: Never Used  . Alcohol Use: No    Allergies:  Allergies  Allergen Reactions  . Aspirin Other (See Comments)    Anxiety,shortness of breath and foot movement  . Penicillins Other (See Comments)    Anxiety,shortness of breath and foot movement    Prescriptions prior to admission  Medication Sig Dispense Refill  . ondansetron (ZOFRAN) 4 MG tablet Take 4 mg by mouth every 8 (eight) hours as needed for nausea.      . Prenatal Vit-Fe Fumarate-FA (PRENATAL MULTIVITAMIN) TABS Take 1 tablet by mouth daily at 12 noon.      . tamsulosin (FLOMAX) 0.4 MG CAPS Take 1 capsule (0.4 mg total) by  mouth daily.  3 capsule  0    Review of Systems  Constitutional: Negative for fever, chills and malaise/fatigue.  Gastrointestinal: Positive for abdominal pain (burning and itching in perineum). Negative for nausea, vomiting, diarrhea and constipation.  Genitourinary: Positive for dysuria and frequency. Negative for urgency and flank pain.  Neurological: Negative for headaches.   Physical Exam   Blood pressure 122/72, pulse 96, temperature 98.4 F (36.9 C), resp. rate 18, height 4' 11.5" (1.511 m), weight 69.219 kg (152 lb 9.6 oz), unknown if currently breastfeeding.  Physical Exam  Constitutional: She is oriented to person, place, and time. She appears well-developed and well-nourished. No distress.  HENT:  Head: Normocephalic.  Cardiovascular: Normal rate.   Respiratory: Effort normal.  GI: Soft. She exhibits no distension. There is tenderness (slight over bladder). There is no rebound and no guarding.  Genitourinary: Vagina normal and uterus normal. No vaginal discharge found.  Cervix closed, stitch intact   Musculoskeletal: Normal range of motion.  Neurological: She is alert and oriented to person, place, and time.  Skin: Skin is warm and dry.  Psychiatric: She has a normal mood and affect.    MAU Course  Procedures  MDM Results for orders placed during the hospital encounter of 08/31/12 (from the past 24 hour(s))  URINALYSIS, ROUTINE W REFLEX MICROSCOPIC     Status: Abnormal   Collection Time    08/31/12 10:04 AM      Result Value Range   Color, Urine YELLOW  YELLOW   APPearance HAZY (*) CLEAR   Specific Gravity, Urine 1.010  1.005 - 1.030   pH 6.5  5.0 - 8.0   Glucose, UA NEGATIVE  NEGATIVE mg/dL   Hgb urine dipstick SMALL (*) NEGATIVE   Bilirubin Urine NEGATIVE  NEGATIVE   Ketones, ur NEGATIVE  NEGATIVE mg/dL   Protein, ur NEGATIVE  NEGATIVE mg/dL   Urobilinogen, UA 0.2  0.0 - 1.0 mg/dL   Nitrite POSITIVE (*) NEGATIVE   Leukocytes, UA LARGE (*) NEGATIVE   URINE MICROSCOPIC-ADD ON     Status: Abnormal   Collection Time    08/31/12 10:04 AM      Result Value Range   Squamous Epithelial / LPF FEW (*) RARE   WBC, UA 21-50  <3 WBC/hpf   RBC / HPF 0-2  <3 RBC/hpf   Bacteria, UA MANY (*) RARE  WET PREP, GENITAL     Status: Abnormal   Collection Time    08/31/12 10:35 AM      Result Value Range   Yeast Wet Prep HPF POC NONE SEEN  NONE SEEN   Trich, Wet Prep NONE SEEN  NONE SEEN   Clue Cells Wet Prep HPF POC NONE SEEN  NONE SEEN   WBC, Wet Prep HPF POC FEW (*) NONE SEEN     Assessment and Plan  A:  SIUP at [redacted]w[redacted]d       UTI      Cerclage in place  P:  Reassured       Rx Keflex       Pelvic rest       Followup in office  [facility] 08/31/2012, 10:42 AM

## 2012-08-31 NOTE — MAU Note (Signed)
Patient presents to MAU with c/o urinary burning and pain since Wednesday; denies vaginal discharge. Denies fever, back pain.  Reports she feels bladder is not completely emptying.

## 2012-08-31 NOTE — MAU Note (Signed)
123 ml urine residual by bladder scan.

## 2012-09-02 LAB — URINE CULTURE: Colony Count: >=100000

## 2013-02-16 ENCOUNTER — Encounter (HOSPITAL_COMMUNITY): Payer: Self-pay | Admitting: Anesthesiology

## 2013-02-16 ENCOUNTER — Inpatient Hospital Stay (HOSPITAL_COMMUNITY)
Admission: AD | Admit: 2013-02-16 | Discharge: 2013-02-18 | DRG: 775 | Disposition: A | Discharge status: Home / Self Care | Payer: Medicaid Other | Source: Ambulatory Visit | Attending: Obstetrics | Admitting: Obstetrics

## 2013-02-16 ENCOUNTER — Encounter (HOSPITAL_COMMUNITY): Payer: Self-pay | Admitting: *Deleted

## 2013-02-16 DIAGNOSIS — O99892 Other specified diseases and conditions complicating childbirth: Secondary | ICD-10-CM | POA: Diagnosis present

## 2013-02-16 DIAGNOSIS — IMO0001 Reserved for inherently not codable concepts without codable children: Secondary | ICD-10-CM

## 2013-02-16 DIAGNOSIS — Z22330 Carrier of Group B streptococcus: Secondary | ICD-10-CM

## 2013-02-16 DIAGNOSIS — O3430 Maternal care for cervical incompetence, unspecified trimester: Secondary | ICD-10-CM | POA: Diagnosis present

## 2013-02-16 HISTORY — DX: Incompetence of cervix uteri: N88.3

## 2013-02-16 LAB — CBC
Hemoglobin: 12.9 g/dL (ref 12.0–15.0)
MCH: 31.2 pg (ref 26.0–34.0)
MCV: 89.4 fL (ref 78.0–100.0)
Platelets: 156 10*3/uL (ref 150–400)
RBC: 4.14 MIL/uL (ref 3.87–5.11)
WBC: 15.2 10*3/uL — ABNORMAL HIGH (ref 4.0–10.5)

## 2013-02-16 LAB — OB RESULTS CONSOLE GBS: GBS: POSITIVE

## 2013-02-16 MED ORDER — ONDANSETRON HCL 4 MG/2ML IJ SOLN: 4.0000 mg | INTRAMUSCULAR | Status: DC | PRN

## 2013-02-16 MED ORDER — WITCH HAZEL-GLYCERIN EX PADS: 1.0000 "application " | MEDICATED_PAD | CUTANEOUS | Status: DC | PRN

## 2013-02-16 MED ORDER — LACTATED RINGERS IV SOLN
500.0000 mL | INTRAVENOUS | Status: DC | PRN
  Administered 2013-02-16: 1000 mL via INTRAVENOUS

## 2013-02-16 MED ORDER — CLINDAMYCIN PHOSPHATE 900 MG/50ML IV SOLN
900.0000 mg | Freq: Three times a day (TID) | INTRAVENOUS | Status: AC
  Administered 2013-02-17 (×3): 900 mg via INTRAVENOUS
  Filled 2013-02-16 (×3): qty 50

## 2013-02-16 MED ORDER — OXYTOCIN BOLUS FROM INFUSION
500.0000 mL | INTRAVENOUS | Status: DC
  Administered 2013-02-16: 500 mL via INTRAVENOUS

## 2013-02-16 MED ORDER — IBUPROFEN 600 MG PO TABS: 600.0000 mg | ORAL_TABLET | Freq: Four times a day (QID) | ORAL | Status: DC | PRN

## 2013-02-16 MED ORDER — LANOLIN HYDROUS EX OINT: TOPICAL_OINTMENT | CUTANEOUS | Status: DC | PRN

## 2013-02-16 MED ORDER — SENNOSIDES-DOCUSATE SODIUM 8.6-50 MG PO TABS
2.0000 | ORAL_TABLET | ORAL | Status: DC
  Administered 2013-02-17 (×2): 2 via ORAL
  Filled 2013-02-16 (×2): qty 2

## 2013-02-16 MED ORDER — ACETAMINOPHEN 325 MG PO TABS: 650.0000 mg | ORAL_TABLET | ORAL | Status: DC | PRN

## 2013-02-16 MED ORDER — SIMETHICONE 80 MG PO CHEW: 80.0000 mg | CHEWABLE_TABLET | ORAL | Status: DC | PRN

## 2013-02-16 MED ORDER — BENZOCAINE-MENTHOL 20-0.5 % EX AERO: 1.0000 "application " | INHALATION_SPRAY | CUTANEOUS | Status: DC | PRN

## 2013-02-16 MED ORDER — CLINDAMYCIN PHOSPHATE 900 MG/50ML IV SOLN
900.0000 mg | Freq: Three times a day (TID) | INTRAVENOUS | Status: DC
  Administered 2013-02-16 (×2): 900 mg via INTRAVENOUS
  Filled 2013-02-16 (×2): qty 50

## 2013-02-16 MED ORDER — PHENYLEPHRINE 40 MCG/ML (10ML) SYRINGE FOR IV PUSH (FOR BLOOD PRESSURE SUPPORT)
80.0000 ug | PREFILLED_SYRINGE | INTRAVENOUS | Status: DC | PRN
  Filled 2013-02-16: qty 10
  Filled 2013-02-16: qty 2

## 2013-02-16 MED ORDER — TETANUS-DIPHTH-ACELL PERTUSSIS 5-2.5-18.5 LF-MCG/0.5 IM SUSP
0.5000 mL | Freq: Once | INTRAMUSCULAR | Status: AC
  Administered 2013-02-18: 0.5 mL via INTRAMUSCULAR
  Filled 2013-02-16: qty 0.5

## 2013-02-16 MED ORDER — CITRIC ACID-SODIUM CITRATE 334-500 MG/5ML PO SOLN: 30.0000 mL | ORAL | Status: DC | PRN

## 2013-02-16 MED ORDER — FENTANYL 2.5 MCG/ML BUPIVACAINE 1/10 % EPIDURAL INFUSION (WH - ANES)
14.0000 mL/h | INTRAMUSCULAR | Status: DC | PRN
  Filled 2013-02-16: qty 125

## 2013-02-16 MED ORDER — PHENYLEPHRINE 40 MCG/ML (10ML) SYRINGE FOR IV PUSH (FOR BLOOD PRESSURE SUPPORT)
80.0000 ug | PREFILLED_SYRINGE | INTRAVENOUS | Status: DC | PRN
  Filled 2013-02-16: qty 2

## 2013-02-16 MED ORDER — ACETAMINOPHEN 325 MG PO TABS
650.0000 mg | ORAL_TABLET | ORAL | Status: DC | PRN
  Administered 2013-02-16 – 2013-02-17 (×2): 650 mg via ORAL
  Filled 2013-02-16 (×2): qty 2

## 2013-02-16 MED ORDER — DIPHENHYDRAMINE HCL 25 MG PO CAPS: 25.0000 mg | ORAL_CAPSULE | Freq: Four times a day (QID) | ORAL | Status: DC | PRN

## 2013-02-16 MED ORDER — ZOLPIDEM TARTRATE 5 MG PO TABS: 5.0000 mg | ORAL_TABLET | Freq: Every evening | ORAL | Status: DC | PRN

## 2013-02-16 MED ORDER — ONDANSETRON HCL 4 MG PO TABS: 4.0000 mg | ORAL_TABLET | ORAL | Status: DC | PRN

## 2013-02-16 MED ORDER — CLINDAMYCIN PHOSPHATE 900 MG/50ML IV SOLN: 900.0000 mg | Freq: Three times a day (TID) | INTRAVENOUS | Status: DC

## 2013-02-16 MED ORDER — EPHEDRINE 5 MG/ML INJ
10.0000 mg | INTRAVENOUS | Status: DC | PRN
  Filled 2013-02-16: qty 4
  Filled 2013-02-16: qty 2

## 2013-02-16 MED ORDER — OXYTOCIN 40 UNITS IN LACTATED RINGERS INFUSION - SIMPLE MED
62.5000 mL/h | INTRAVENOUS | Status: DC
  Filled 2013-02-16: qty 1000

## 2013-02-16 MED ORDER — IBUPROFEN 600 MG PO TABS
600.0000 mg | ORAL_TABLET | Freq: Four times a day (QID) | ORAL | Status: DC
  Administered 2013-02-17 – 2013-02-18 (×5): 600 mg via ORAL
  Filled 2013-02-16 (×5): qty 1

## 2013-02-16 MED ORDER — OXYCODONE-ACETAMINOPHEN 5-325 MG PO TABS: 1.0000 | ORAL_TABLET | ORAL | Status: DC | PRN

## 2013-02-16 MED ORDER — FERROUS SULFATE 325 (65 FE) MG PO TABS
325.0000 mg | ORAL_TABLET | Freq: Two times a day (BID) | ORAL | Status: DC
  Administered 2013-02-17 – 2013-02-18 (×2): 325 mg via ORAL
  Filled 2013-02-16 (×3): qty 1

## 2013-02-16 MED ORDER — PRENATAL MULTIVITAMIN CH
1.0000 | ORAL_TABLET | Freq: Every day | ORAL | Status: DC
  Administered 2013-02-17 – 2013-02-18 (×2): 1 via ORAL
  Filled 2013-02-16 (×2): qty 1

## 2013-02-16 MED ORDER — OXYCODONE-ACETAMINOPHEN 5-325 MG PO TABS
1.0000 | ORAL_TABLET | ORAL | Status: DC | PRN
  Administered 2013-02-18: 1 via ORAL
  Filled 2013-02-16: qty 1

## 2013-02-16 MED ORDER — LACTATED RINGERS IV SOLN: 500.0000 mL | Freq: Once | INTRAVENOUS | Status: DC

## 2013-02-16 MED ORDER — DIPHENHYDRAMINE HCL 50 MG/ML IJ SOLN: 12.5000 mg | INTRAMUSCULAR | Status: DC | PRN

## 2013-02-16 MED ORDER — EPHEDRINE 5 MG/ML INJ
10.0000 mg | INTRAVENOUS | Status: DC | PRN
  Filled 2013-02-16: qty 2

## 2013-02-16 MED ORDER — FLEET ENEMA 7-19 GM/118ML RE ENEM: 1.0000 | ENEMA | RECTAL | Status: DC | PRN

## 2013-02-16 MED ORDER — DIBUCAINE 1 % RE OINT: 1.0000 "application " | TOPICAL_OINTMENT | RECTAL | Status: DC | PRN

## 2013-02-16 MED ORDER — ONDANSETRON HCL 4 MG/2ML IJ SOLN: 4.0000 mg | Freq: Four times a day (QID) | INTRAMUSCULAR | Status: DC | PRN

## 2013-02-16 MED ORDER — LACTATED RINGERS IV SOLN
INTRAVENOUS | Status: DC
  Administered 2013-02-16 (×2): via INTRAVENOUS

## 2013-02-16 MED ORDER — LIDOCAINE HCL (PF) 1 % IJ SOLN
30.0000 mL | INTRAMUSCULAR | Status: DC | PRN
  Filled 2013-02-16 (×2): qty 30

## 2013-02-16 NOTE — MAU Note (Signed)
C/o ucs since 1300 this afternoon;

## 2013-02-16 NOTE — H&P (Signed)
This is Dr. Francoise Ceo dictating the history and physical on Aimee Benjamin she's a 32 year old gravida 6 para 0050 no hasn't had a history of multiple second trimester losses and this pregnancy she had a cervical cerclage placed because of a history of incompetent cervix the cerclage was removed at 35 weeks and the patient EDC was 02/25/2013 positive GBS which she got penicillin she was admitted in labor 6 cm dilated 100% and progressed satisfactorily and had a normal basilar of a female Apgar 77 placenta spontaneous and him second-degree perineal repaired with 2-0 Vicryl suture blood loss 250 cc past medical history as above with a spontaneous second trimester abortions Past surgical history cervical cerclage during this pregnancy Social history negative System review noncontributory Physical exam well-developed female in no distress HEENT negative Lungs clear to P&A Breasts negative Heart regular rhythm no murmurs no gallops Uterus 20 week size postpartum Extremities negative

## 2013-02-16 NOTE — MAU Note (Deleted)
C/o uc since 0100 this AM;

## 2013-02-16 NOTE — Anesthesia Preprocedure Evaluation (Signed)

## 2013-02-17 LAB — TYPE AND SCREEN: ABO/RH(D): O POS

## 2013-02-17 LAB — CBC
HCT: 32 % — ABNORMAL LOW (ref 36.0–46.0)
Hemoglobin: 11 g/dL — ABNORMAL LOW (ref 12.0–15.0)
MCH: 30.5 pg (ref 26.0–34.0)
MCV: 88.6 fL (ref 78.0–100.0)
Platelets: 140 10*3/uL — ABNORMAL LOW (ref 150–400)
RBC: 3.61 MIL/uL — ABNORMAL LOW (ref 3.87–5.11)
WBC: 15.7 10*3/uL — ABNORMAL HIGH (ref 4.0–10.5)

## 2013-02-17 LAB — RPR: RPR Ser Ql: NONREACTIVE

## 2013-02-17 MED ORDER — INFLUENZA VAC SPLIT QUAD 0.5 ML IM SUSP
0.5000 mL | INTRAMUSCULAR | Status: AC
  Administered 2013-02-18: 0.5 mL via INTRAMUSCULAR
  Filled 2013-02-17: qty 0.5

## 2013-02-17 NOTE — Anesthesia Postprocedure Evaluation (Signed)
Anesthesia Post Note  Patient: Aimee Benjamin  Procedure(s) Performed: * No procedures listed *  Anesthesia type: Epidural  Patient location: Mother/Baby  Post pain: Pain level controlled  Post assessment: Post-op Vital signs reviewed  Last Vitals:  Filed Vitals:   02/17/13 0515  BP: 102/64  Pulse: 104  Temp: 36.9 C  Resp: 18    Post vital signs: Reviewed  Level of consciousness: awake  Complications: No apparent anesthesia complications

## 2013-02-17 NOTE — Progress Notes (Signed)
Patient ID: Aimee Benjamin, female   DOB: 18-Feb-1981, 32 y.o.   MRN: [id number] waspostpartum day one Vital signs normal Fundus firm Lochia moderate Legs negative Doing well

## 2013-02-17 NOTE — Lactation Note (Signed)
This note was copied from the chart of Aimee Benjamin. Lactation Consultation Note  Patient Name: Aimee Tiannah Greenly OZHYQ'M Date: 02/17/2013 Reason for consult: Initial assessment Shanda Bumps, Spanish interpreter present for visit. BF basics reviewed with Mom. Encouraged to BF with feeding ques, cluster feeding discussed. Mom reports she is having some trouble with latching baby. She has been given shells and hand pump and reports pre-pumping has helped with getting baby to latch. Mom's nipples are erect but with very short nipple shaft. Encouraged Mom to continue to wear shells and pre-pump. Encouraged to call with next feeding so LC could assist with latch. Lactation brochure left for review, advised of OP services and support group. Mom asked about formula, encouraged to keep at the breast. Discussed risk of early supplementation to BF success. Guidelines for supplementing with BF reviewed with Mom if she decides to supplement. Encouraged to call for assist.   Maternal Data Formula Feeding for Exclusion: No Infant to breast within first hour of birth: Yes Has patient been taught Hand Expression?: Yes Does the patient have breastfeeding experience prior to this delivery?: No  Feeding    LATCH Score/Interventions                Intervention(s): Breastfeeding basics reviewed     Lactation Tools Discussed/Used     Consult Status Consult Status: Follow-up Date: 02/17/13 Follow-up type: In-patient    Alfred Levins 02/17/2013, 5:56 PM

## 2013-02-17 NOTE — Progress Notes (Signed)
UR chart review completed.  

## 2013-02-18 MED ORDER — PRENATAL MULTIVITAMIN CH: 1.0000 | ORAL_TABLET | Freq: Every day | ORAL | Status: DC

## 2013-02-18 NOTE — Discharge Summary (Signed)
Obstetric Discharge Summary Reason for Admission: onset of labor Prenatal Procedures: none Intrapartum Procedures: spontaneous vaginal delivery Postpartum Procedures: none Complications-Operative and Postpartum: none Hemoglobin  Date Value Range Status  02/17/2013 11.0* 12.0 - 15.0 g/dL Final     HCT  Date Value Range Status  02/17/2013 32.0* 36.0 - 46.0 % Final    Physical Exam:  General: alert Lochia: appropriate Uterine Fundus: firm Incision: healing well DVT Evaluation: No evidence of DVT seen on physical exam.  Discharge Diagnoses: Term Pregnancy-delivered  Discharge Information: Date: 02/18/2013 Activity: pelvic rest Diet: routine Medications: Percocet Condition: stable Instructions: refer to practice specific booklet Discharge to: home Follow-up Information   Follow up with [name], MD.   Specialty:  Obstetrics and Gynecology   Contact information:   [address] [place] [address] [phone]       Newborn Data: Live born female  Birth Weight: 6 lb 13.6 oz (3107 g) APGAR: 9, 9  Home with mother.  Aimee Benjamin A 02/18/2013, 7:13 AM

## 2013-02-20 ENCOUNTER — Inpatient Hospital Stay (HOSPITAL_COMMUNITY)
Admission: AD | Admit: 2013-02-20 | Discharge: 2013-02-20 | Disposition: A | Discharge status: Home / Self Care | Payer: Medicaid Other | Source: Ambulatory Visit | Attending: Obstetrics | Admitting: Obstetrics

## 2013-02-20 ENCOUNTER — Encounter (HOSPITAL_COMMUNITY): Payer: Self-pay | Admitting: *Deleted

## 2013-02-20 DIAGNOSIS — IMO0002 Reserved for concepts with insufficient information to code with codable children: Secondary | ICD-10-CM | POA: Insufficient documentation

## 2013-02-20 DIAGNOSIS — O864 Pyrexia of unknown origin following delivery: Secondary | ICD-10-CM | POA: Insufficient documentation

## 2013-02-20 DIAGNOSIS — R5381 Other malaise: Secondary | ICD-10-CM

## 2013-02-20 DIAGNOSIS — R609 Edema, unspecified: Secondary | ICD-10-CM

## 2013-02-20 LAB — CBC WITH DIFFERENTIAL/PLATELET
Basophils Relative: 0 % (ref 0–1)
Eosinophils Relative: 2 % (ref 0–5)
HCT: 31.9 % — ABNORMAL LOW (ref 36.0–46.0)
Hemoglobin: 11.1 g/dL — ABNORMAL LOW (ref 12.0–15.0)
MCHC: 34.8 g/dL (ref 30.0–36.0)
MCV: 89.4 fL (ref 78.0–100.0)
Monocytes Absolute: 0.8 10*3/uL (ref 0.1–1.0)
Monocytes Relative: 8 % (ref 3–12)
Neutro Abs: 6.9 10*3/uL (ref 1.7–7.7)
RBC: 3.57 MIL/uL — ABNORMAL LOW (ref 3.87–5.11)
RDW: 12.8 % (ref 11.5–15.5)

## 2013-02-20 NOTE — MAU Provider Note (Signed)
History     CSN: [id number]  Arrival date and time: 02/20/13 1332   First Provider Initiated Contact with Patient 02/20/13 1620      Chief Complaint  Patient presents with  . Fever  . Fatigue   HPI This is a 32 y.o. female who is 4 days postpartum from a SVD with Dr Aimee Benjamin, presents with c/o feeling feverish and weak.  Pregnancy was remarkable for cerclage due to multiple losses.  Delivery was uneventful except for a second degree laceration.   RN Note: Patient states she had a vaginal delivery on 12-1. States she feels like she has a fever and feels weak.       OB History   Grav Para Term Preterm Abortions TAB SAB Ect Mult Living   6 2 1 1 4  4   1       Past Medical History  Diagnosis Date  . Incompetence of cervix     Past Surgical History  Procedure Laterality Date  . Cholecystectomy    . Cervical cerclage N/A 08/27/2012    Procedure: CERCLAGE CERVICAL;  Surgeon: [name], MD;  Location: [facility];  Service: Gynecology;  Laterality: N/A;    Family History  Problem Relation Age of Onset  . Diabetes Mother     History  Substance Use Topics  . Smoking status: Never Smoker   . Smokeless tobacco: Never Used  . Alcohol Use: No    Allergies:  Allergies  Allergen Reactions  . Aspirin Other (See Comments)    Anxiety,shortness of breath and foot movement. Can take Ibuprofen per pt  . Penicillins Other (See Comments)    Anxiety,shortness of breath and foot movement    Prescriptions prior to admission  Medication Sig Dispense Refill  . Prenatal Vit-Fe Fumarate-FA (PRENATAL MULTIVITAMIN) TABS tablet Take 1 tablet by mouth daily at 12 noon.  301 tablet  1    Review of Systems  Constitutional: Positive for fever, chills and malaise/fatigue.  Eyes: Negative for blurred vision and double vision.  Cardiovascular: Negative for chest pain.  Gastrointestinal: Positive for abdominal pain (cramps, mild). Negative for nausea and vomiting.  Genitourinary:  Negative for dysuria.       Pain at perineal stitches   Neurological: Positive for weakness. Negative for headaches.   Physical Exam   Blood pressure 117/73, pulse 95, temperature 99.1 F (37.3 C), temperature source Oral, resp. rate 16, SpO2 100.00%. Filed Vitals:   02/20/13 1419  BP: 117/73  Pulse: 95  Temp: 99.1 F (37.3 C)  TempSrc: Oral  Resp: 16  SpO2: 100%    Physical Exam  Constitutional: She appears well-developed and well-nourished. No distress.  Cardiovascular: Normal rate.   Respiratory: Effort normal.  Breasts filling, nontender, no masses   GI: Soft. She exhibits no distension and no mass. There is no tenderness. There is no rebound and no guarding.  Genitourinary: Vaginal discharge (light lochia) found.    MAU Course  Procedures Results for orders placed during the hospital encounter of 02/20/13 (from the past 24 hour(s))  CBC WITH DIFFERENTIAL     Status: Abnormal   Collection Time    02/20/13  2:04 PM      Result Value Range   WBC 9.2  4.0 - 10.5 K/uL   RBC 3.57 (*) 3.87 - 5.11 MIL/uL   Hemoglobin 11.1 (*) 12.0 - 15.0 g/dL   HCT 84.1 (*) 32.4 - 40.1 %   MCV 89.4  78.0 - 100.0 fL   MCH  31.1  26.0 - 34.0 pg   MCHC 34.8  30.0 - 36.0 g/dL   RDW 96.0  45.4 - 09.8 %   Platelets 192  150 - 400 K/uL   Neutrophils Relative % 76  43 - 77 %   Neutro Abs 6.9  1.7 - 7.7 K/uL   Lymphocytes Relative 14  12 - 46 %   Lymphs Abs 1.3  0.7 - 4.0 K/uL   Monocytes Relative 8  3 - 12 %   Monocytes Absolute 0.8  0.1 - 1.0 K/uL   Eosinophils Relative 2  0 - 5 %   Eosinophils Absolute 0.2  0.0 - 0.7 K/uL   Basophils Relative 0  0 - 1 %   Basophils Absolute 0.0  0.0 - 0.1 K/uL   WBC is lower than that at discharge.  Assessment and Plan  A;  Postpartum, s/p Vaginal Delivery      Malaise, edema and general discomforts      No evidence of acute illness  P:  Discussed findings, reassured       Instructed to purchase digital thermometer       Call or come in if  worsened or if develops fever over 100.5  [facility] 02/20/2013, 4:21 PM

## 2013-02-20 NOTE — MAU Note (Signed)
Patient states she had a vaginal delivery on 12-1. States she feels like she has a fever and feels weak.

## 2013-02-23 ENCOUNTER — Ambulatory Visit: Payer: Self-pay

## 2013-02-23 NOTE — Lactation Note (Addendum)
This note was copied from the chart of Aimee Benjamin. Infant Lactation Consultation Outpatient Visit Note       Interpreter present  Aimee Benjamin is here today because he was discharged using a nipple shield.  Today when I asked mom to BF the baby she told me that she wasn't very good at "this"  I wasn't  Able to clarify if she meant with the positioning or with BF.  With this feeding mom is awkward and is having some difficulty obtaining a good latch even with the NS. I encouraged her to bring the baby closer to her and reassured her that the baby could breathe.  He did latch with LC assistance and when he finished on the left side an attempt was made to latch him to the right side but he was no longer hungry.  Mom is very aware of feeding and fullness cues.  Her Instructions for discharge are to increase her MS and to continue feeding Aimee Benjamin.  She will express both breasts for 10 minutes after BF (6 times a day per the pediatrician) , and pump both breasts for 15 minutes at 2 other sessions throughout the day.  I gave her another harmony to help her with the process.  Breast compression was also shown.  She is aware that she must keep her breasts soft to have a good milk supply.  I asked her to discuss replacing some of the formula with expressed breast milk with the pediatrician.  She has another lactation appointment scheduled because of the nipple shield use, lack of evidence of proper technique, and the potential for her MS to decrease related to infrequent emptying.  Patient Name: Aimee Benjamin Date of Birth: 02/16/2014 Birth Weight:  6 lb 13.6 oz (3107 g) 12/ 5 6+5 at pediatrician, 12/8  6+9  At lactation consult Gestational Age at Delivery: Gestational Age: [redacted]w[redacted]d Type of Delivery: vaginal  Breastfeeding History Frequency of Breastfeeding: 6 times (2 x with NS) Length of Feeding: 30 minutes Voids: 6+ Stools: 7  Supplementing / Method: Pumping:  Type of Pump:single pump   Frequency: once a day(both sides)  Volume:  5 oz (both sessions combined)  Comments:  Supplements with 1 - 2 ounces formula 6 times in 24 hours    Consultation Evaluation:  Initial Feeding Assessment: Pre-feed RUEAVW:0981 Post-feed Weight:3018 Amount Transferred: 5 ml in football hold 19 in cross cradle Comments: not hearing many swallows in football hold so changed baby to cross cradle   Total Breast milk Transferred this Visit:  Total Supplement Given:   Additional Interventions:   Follow-Up  Pediatrician tomorrow Lactation 12/ 15/ 2014  Perhaps smart start could weigh Aimee Benjamin later this week.    Soyla Dryer 02/23/2013, 4:30 PM

## 2014-01-05 LAB — CYTOLOGY - PAP: PAP SMEAR: NEGATIVE

## 2014-01-08 LAB — OB RESULTS CONSOLE RPR: RPR: NONREACTIVE

## 2014-01-08 LAB — OB RESULTS CONSOLE HEPATITIS B SURFACE ANTIGEN: HEP B S AG: NEGATIVE

## 2014-01-08 LAB — OB RESULTS CONSOLE ABO/RH: RH TYPE: POSITIVE

## 2014-01-08 LAB — OB RESULTS CONSOLE ANTIBODY SCREEN: Antibody Screen: NEGATIVE

## 2014-01-08 LAB — OB RESULTS CONSOLE HIV ANTIBODY (ROUTINE TESTING): HIV: NONREACTIVE

## 2014-01-08 LAB — OB RESULTS CONSOLE GC/CHLAMYDIA
Chlamydia: NEGATIVE
GC PROBE AMP, GENITAL: NEGATIVE

## 2014-01-08 LAB — OB RESULTS CONSOLE RUBELLA ANTIBODY, IGM: RUBELLA: IMMUNE

## 2014-01-18 ENCOUNTER — Encounter (HOSPITAL_COMMUNITY): Payer: Self-pay | Admitting: *Deleted

## 2014-02-02 ENCOUNTER — Other Ambulatory Visit: Payer: Self-pay | Admitting: Obstetrics

## 2014-02-08 NOTE — H&P (Unsigned)
Aimee Benjamin Benjamin:  Benjamin, Aimee Benjamin           ACCOUNT NO.:  [id number]  MEDICAL RECORD NO.:  [id number]  LOCATION:                                 FACILITY:  PHYSICIAN:  Kathreen CosierBernard A. Marshall, M.D.DATE OF BIRTH:  1980-10-30  DATE OF ADMISSION: DATE OF DISCHARGE:                             HISTORY & PHYSICAL   Date of operation February 09, 2014.  The patient is a [age] year old, gravida 6, para 1-0-4-1.  She is now [redacted] weeks pregnant, [facility]  The patient has a history of an incompetent cervix and with her last pregnancy in 2014 had a cervical cerclage placed and had a normal vaginal delivery of a 7 pounds female. The patient is now in for a repeat cervical cerclage.  PAST MEDICAL HISTORY:  As above.  PAST SURGICAL HISTORY:  Cervical cerclage in 2014.  SOCIAL HISTORY:  Negative.  SYSTEM REVIEW:  Noncontributory.  PHYSICAL EXAMINATION:  GENERAL:  Well-developed female in no distress. HEENT:  Negative. LUNGS:  Clear to P and A. HEART:  Regular rhythm.  No murmurs, no gallops. BREASTS:  Negative. PELVIC:  Uterus 12-14 week size.  Cervix closed. EXTREMITIES:  Negative.          ______________________________ Kathreen CosierBernard A. Marshall, M.D.     BAM/MEDQ  D:  02/08/2014  T:  02/08/2014  Job:  [id number]

## 2014-02-09 ENCOUNTER — Ambulatory Visit (HOSPITAL_COMMUNITY): Payer: Self-pay | Admitting: Anesthesiology

## 2014-02-09 ENCOUNTER — Encounter (HOSPITAL_COMMUNITY)
Admission: RE | Disposition: A | Discharge status: Home / Self Care | Payer: Self-pay | Source: Ambulatory Visit | Attending: Obstetrics

## 2014-02-09 ENCOUNTER — Ambulatory Visit (HOSPITAL_COMMUNITY)
Admission: RE | Admit: 2014-02-09 | Discharge: 2014-02-09 | Disposition: A | Discharge status: Home / Self Care | Payer: Self-pay | Source: Ambulatory Visit | Attending: Obstetrics | Admitting: Obstetrics

## 2014-02-09 ENCOUNTER — Encounter (HOSPITAL_COMMUNITY): Payer: Self-pay

## 2014-02-09 DIAGNOSIS — O3432 Maternal care for cervical incompetence, second trimester: Secondary | ICD-10-CM | POA: Insufficient documentation

## 2014-02-09 DIAGNOSIS — Z3A14 14 weeks gestation of pregnancy: Secondary | ICD-10-CM | POA: Insufficient documentation

## 2014-02-09 HISTORY — PX: CERVICAL CERCLAGE: SHX1329

## 2014-02-09 LAB — CBC
HCT: 39.5 % (ref 36.0–46.0)
Hemoglobin: 13.8 g/dL (ref 12.0–15.0)
MCH: 30.7 pg (ref 26.0–34.0)
MCHC: 34.9 g/dL (ref 30.0–36.0)
MCV: 87.8 fL (ref 78.0–100.0)
PLATELETS: 189 10*3/uL (ref 150–400)
RBC: 4.5 MIL/uL (ref 3.87–5.11)
RDW: 13.4 % (ref 11.5–15.5)
WBC: 10.4 10*3/uL (ref 4.0–10.5)

## 2014-02-09 SURGERY — CERCLAGE, CERVIX, VAGINAL APPROACH
Anesthesia: Spinal

## 2014-02-09 MED ORDER — LIDOCAINE IN DEXTROSE 5-7.5 % IV SOLN
INTRAVENOUS | Status: AC
  Filled 2014-02-09: qty 2

## 2014-02-09 MED ORDER — LIDOCAINE IN DEXTROSE 5-7.5 % IV SOLN
INTRAVENOUS | Status: DC | PRN
  Administered 2014-02-09: 1 mL via INTRATHECAL

## 2014-02-09 MED ORDER — 0.9 % SODIUM CHLORIDE (POUR BTL) OPTIME
TOPICAL | Status: DC | PRN
  Administered 2014-02-09: 1000 mL

## 2014-02-09 MED ORDER — ONDANSETRON HCL 4 MG/2ML IJ SOLN
INTRAMUSCULAR | Status: DC | PRN
  Administered 2014-02-09: 4 mg via INTRAVENOUS

## 2014-02-09 MED ORDER — FENTANYL CITRATE 0.05 MG/ML IJ SOLN: 25.0000 ug | INTRAMUSCULAR | Status: DC | PRN

## 2014-02-09 MED ORDER — OXYCODONE HCL 5 MG PO TABS: 5.0000 mg | ORAL_TABLET | Freq: Once | ORAL | Status: DC | PRN

## 2014-02-09 MED ORDER — SCOPOLAMINE 1 MG/3DAYS TD PT72
1.0000 | MEDICATED_PATCH | Freq: Once | TRANSDERMAL | Status: DC
  Administered 2014-02-09: 1.5 mg via TRANSDERMAL

## 2014-02-09 MED ORDER — MEPERIDINE HCL 25 MG/ML IJ SOLN: 6.2500 mg | INTRAMUSCULAR | Status: DC | PRN

## 2014-02-09 MED ORDER — SCOPOLAMINE 1 MG/3DAYS TD PT72
MEDICATED_PATCH | TRANSDERMAL | Status: AC
  Administered 2014-02-09: 1.5 mg via TRANSDERMAL
  Filled 2014-02-09: qty 1

## 2014-02-09 MED ORDER — LACTATED RINGERS IV SOLN
INTRAVENOUS | Status: DC
  Administered 2014-02-09: 50 mL/h via INTRAVENOUS
  Administered 2014-02-09: 13:00:00 via INTRAVENOUS

## 2014-02-09 MED ORDER — PROMETHAZINE HCL 25 MG/ML IJ SOLN: 6.2500 mg | INTRAMUSCULAR | Status: DC | PRN

## 2014-02-09 MED ORDER — ONDANSETRON HCL 4 MG/2ML IJ SOLN
INTRAMUSCULAR | Status: AC
  Filled 2014-02-09: qty 2

## 2014-02-09 MED ORDER — OXYCODONE HCL 5 MG/5ML PO SOLN: 5.0000 mg | Freq: Once | ORAL | Status: DC | PRN

## 2014-02-09 MED ORDER — FENTANYL CITRATE 0.05 MG/ML IJ SOLN
INTRAMUSCULAR | Status: AC
  Filled 2014-02-09: qty 5

## 2014-02-09 SURGICAL SUPPLY — 17 items
CLOTH BEACON ORANGE TIMEOUT ST (SAFETY) ×3 IMPLANT
COUNTER NEEDLE 1200 MAGNETIC (NEEDLE) ×3 IMPLANT
GLOVE BIO SURGEON STRL SZ8.5 (GLOVE) ×3 IMPLANT
GOWN STRL REUS W/TWL 2XL LVL3 (GOWN DISPOSABLE) ×3 IMPLANT
GOWN STRL REUS W/TWL LRG LVL3 (GOWN DISPOSABLE) ×3 IMPLANT
NEEDLE MA TROC 1/2 (NEEDLE) IMPLANT
NEEDLE MAYO .5 CIRCLE (NEEDLE) ×3 IMPLANT
PACK VAGINAL MINOR WOMEN LF (CUSTOM PROCEDURE TRAY) ×3 IMPLANT
PAD OB MATERNITY 4.3X12.25 (PERSONAL CARE ITEMS) ×3 IMPLANT
PAD PREP 24X48 CUFFED NSTRL (MISCELLANEOUS) ×3 IMPLANT
SUT MERSILENE 5MM BP 1 12 (SUTURE) ×3 IMPLANT
TOWEL OR 17X24 6PK STRL BLUE (TOWEL DISPOSABLE) ×6 IMPLANT
TRAY FOLEY CATH 14FR (SET/KITS/TRAYS/PACK) ×3 IMPLANT
TUBING NON-CON 1/4 X 20 CONN (TUBING) ×2 IMPLANT
TUBING NON-CON 1/4 X 20' CONN (TUBING) ×1
WATER STERILE IRR 1000ML POUR (IV SOLUTION) ×3 IMPLANT
YANKAUER SUCT BULB TIP NO VENT (SUCTIONS) ×3 IMPLANT

## 2014-02-09 NOTE — H&P (Signed)
  There has been no change in her history and physical since the original dictation 

## 2014-02-09 NOTE — Anesthesia Postprocedure Evaluation (Signed)
  Anesthesia Post-op Note  Patient: Johann Tovar-Luna  Procedure(s) Performed: Procedure(s): CERCLAGE CERVICAL (N/A)  Patient Location: PACU  Anesthesia Type:Spinal  Level of Consciousness: awake, alert  and oriented  Airway and Oxygen Therapy: Patient Spontanous Breathing  Post-op Pain: none  Post-op Assessment: Post-op Vital signs reviewed, Patient's Cardiovascular Status Stable, Respiratory Function Stable, Patent Airway, No signs of Nausea or vomiting, Pain level controlled, No headache, No backache, No residual numbness and No residual motor weakness  Post-op Vital Signs: Reviewed and stable  Last Vitals:  Filed Vitals:   02/09/14 1323  BP: 111/49  Pulse: 80  Temp: 36.7 C  Resp: 16    Complications: No apparent anesthesia complications

## 2014-02-09 NOTE — Op Note (Signed)
Preop diagnosis incompetent cervix IUP 14 weeks Postop diagnosis placement of cervical cerclage Anesthesia spinal Surgeon Dr. Francoise CeoBernard Aimee Benjamin Procedure patient in the lithotomy position perineum and vagina prepped and draped bladder emptied with a Foley catheter a weighted speculum placed in the vagina cervix grasped at 12:00 without sponge forcep and using a #5 Mersilene band the cervical cerclage was placed high up on the cervix and tied at 4:00 patient tolerated the procedure well taken to recovery room in good condition

## 2014-02-09 NOTE — Anesthesia Procedure Notes (Signed)
Spinal Patient location during procedure: OR Start time: 02/09/2014 12:45 PM End time: 02/09/2014 12:47 PM Staffing Anesthesiologist: Leilani AbleHATCHETT, Anayeli Arel Performed by: anesthesiologist  Preanesthetic Checklist Completed: patient identified, surgical consent, pre-op evaluation, timeout performed, IV checked, risks and benefits discussed and monitors and equipment checked Spinal Block Patient position: sitting Prep: site prepped and draped and DuraPrep Patient monitoring: heart rate, cardiac monitor, continuous pulse ox and blood pressure Approach: midline Location: L3-4 Injection technique: single-shot Needle Needle type: Pencan  Needle gauge: 24 G Needle length: 9 cm Needle insertion depth: 6 cm Assessment Sensory level: T10

## 2014-02-09 NOTE — Discharge Instructions (Signed)
Discharge instructions   You can wash your hair  Shower  Eat what you want  Drink what you want  See me in 6 weeks  Your ankles are going to swell more in the next 2 weeks than when pregnant  No sex for 6 weeks   Daytona Hedman A, MD 02/09/2014

## 2014-02-09 NOTE — Progress Notes (Signed)
All assessment verbal data translated from hospital approved translater.

## 2014-02-09 NOTE — Transfer of Care (Signed)
Immediate Anesthesia Transfer of Care Note  Patient: Aimee Benjamin  Procedure(s) Performed: Procedure(s): CERCLAGE CERVICAL (N/A)  Patient Location: PACU  Anesthesia Type:Spinal  Level of Consciousness: awake, alert  and oriented  Airway & Oxygen Therapy: Patient Spontanous Breathing  Post-op Assessment: Report given to PACU RN and Post -op Vital signs reviewed and stable  Post vital signs: Reviewed and stable  Complications: No apparent anesthesia complications

## 2014-02-09 NOTE — Anesthesia Preprocedure Evaluation (Signed)
Anesthesia Evaluation  Patient identified by MRN, date of birth, ID band Patient awake    Reviewed: Allergy & Precautions, H&P , NPO status   Airway Mallampati: I  TM Distance: >3 FB Neck ROM: full    Dental no notable dental hx.    Pulmonary neg pulmonary ROS,    Pulmonary exam normal       Cardiovascular negative cardio ROS      Neuro/Psych negative neurological ROS  negative psych ROS   GI/Hepatic negative GI ROS, Neg liver ROS,   Endo/Other  negative endocrine ROS  Renal/GU negative Renal ROS     Musculoskeletal   Abdominal Normal abdominal exam  (+)   Peds  Hematology negative hematology ROS (+)   Anesthesia Other Findings   Reproductive/Obstetrics (+) Pregnancy                             Anesthesia Physical Anesthesia Plan  ASA: II  Anesthesia Plan: Spinal   Post-op Pain Management:    Induction:   Airway Management Planned:   Additional Equipment:   Intra-op Plan:   Post-operative Plan:   Informed Consent: I have reviewed the patients History and Physical, chart, labs and discussed the procedure including the risks, benefits and alternatives for the proposed anesthesia with the patient or authorized representative who has indicated his/her understanding and acceptance.     Plan Discussed with: CRNA and Surgeon  Anesthesia Plan Comments:         Anesthesia Quick Evaluation

## 2014-02-10 ENCOUNTER — Encounter (HOSPITAL_COMMUNITY): Payer: Self-pay | Admitting: Obstetrics

## 2014-03-19 NOTE — L&D Delivery Note (Signed)
Delivery Note At 8:17 PM a viable female was delivered via Vaginal, Spontaneous Delivery (Presentation: ;  ).  APGAR: 1, 8; weight  .   Placenta status: , .  Cord: 3 vessels with the following complications: .  Cord pH: not done  Anesthesia: Epidural  Episiotomy:   Lacerations:   Suture Repair: 2.0 vicryl Est. Blood Loss (mL):    Mom to postpartum.  Baby to Couplet care / Skin to Skin.  Aimee Benjamin A 08/14/2014, 8:47 PM

## 2014-04-06 ENCOUNTER — Other Ambulatory Visit (HOSPITAL_COMMUNITY): Payer: Self-pay | Admitting: Obstetrics

## 2014-04-06 DIAGNOSIS — Z3689 Encounter for other specified antenatal screening: Secondary | ICD-10-CM

## 2014-04-06 DIAGNOSIS — Z3A23 23 weeks gestation of pregnancy: Secondary | ICD-10-CM

## 2014-04-06 DIAGNOSIS — O368390 Maternal care for abnormalities of the fetal heart rate or rhythm, unspecified trimester, not applicable or unspecified: Secondary | ICD-10-CM

## 2014-04-13 ENCOUNTER — Ambulatory Visit (HOSPITAL_COMMUNITY)
Admission: RE | Admit: 2014-04-13 | Discharge: 2014-04-13 | Disposition: A | Discharge status: Home / Self Care | Payer: Self-pay | Source: Ambulatory Visit | Attending: Obstetrics | Admitting: Obstetrics

## 2014-04-13 ENCOUNTER — Encounter (HOSPITAL_COMMUNITY): Payer: Self-pay

## 2014-04-13 ENCOUNTER — Other Ambulatory Visit (HOSPITAL_COMMUNITY): Payer: Self-pay | Admitting: Obstetrics

## 2014-04-13 ENCOUNTER — Other Ambulatory Visit (HOSPITAL_COMMUNITY): Payer: Self-pay

## 2014-04-13 DIAGNOSIS — O3432 Maternal care for cervical incompetence, second trimester: Secondary | ICD-10-CM

## 2014-04-13 DIAGNOSIS — Z3A23 23 weeks gestation of pregnancy: Secondary | ICD-10-CM

## 2014-04-13 DIAGNOSIS — O09212 Supervision of pregnancy with history of pre-term labor, second trimester: Secondary | ICD-10-CM | POA: Insufficient documentation

## 2014-04-13 DIAGNOSIS — O09522 Supervision of elderly multigravida, second trimester: Secondary | ICD-10-CM

## 2014-04-13 DIAGNOSIS — Z3689 Encounter for other specified antenatal screening: Secondary | ICD-10-CM

## 2014-04-13 DIAGNOSIS — O368390 Maternal care for abnormalities of the fetal heart rate or rhythm, unspecified trimester, not applicable or unspecified: Secondary | ICD-10-CM

## 2014-04-13 NOTE — Consult Note (Signed)
MFM consult  5834/34 yr old [id number] at 7051w1d with history of cervical insufficiency referred referred by Dr. Gaynell FaceMarshall for fetal anatomic survey and consult secondary to irregular fetal heart beat seen on outside ultrasound.  Past OB hx: 2009 first trimester miscarriage; 2009 first trimester miscarriage; 2010 first trimester miscarriage; 2011 19 week loss with bulging membranes; 2014 full term delivery- had history indicated cerclage  No other significant medical or surgical history  Ultrasound today shows: Single intrauterine pregnancy. Estimated fetal weight is in the 60th%. Fundal placenta without evidence of previa. Normal amniotic fluid volume. Normal transvaginal cervical length; although measurement was difficult due to lower uterine segment contraction. The cerclage is visualized. Normal fetal anatomic survey. Normal fetal heart rate and rhythm during the exam.  I counseled the patient as follows: 1. Appropriate fetal growth. 2. Normal fetal anatomic survey. 3. Advanced maternal age: - discussed association with increased risk of fetal aneuploidy - patient did not have screening - discussed option of screening test and amniocentesis and risks/benefits/limitations of each - offered genetic counseling referral - patient declined all the above 4. Cervical insufficiency: - previously lost pregnancy at 19 weeks - has history indicated cerclage - discussed increased risk of preterm delivery - recommend preterm labor precautions - no further cervical length surveillance indicated unless symptoms develop 5. Irregular heart beat seen on outside ultrasound: - not seen on today's exam - unclear from report what the arrhythmia was felt to be; most common is premature atrial contractions - recommend routine visits with dop tones at each visit; if arrhythmia is heard recommend fetal echocardiogram - fetal heart structurally normal on today's ultrasound; discussed limitations of ultrasound in  detecting fetal anomalies  I spent a total of 45 minutes with the patient of which >50% was in face to face consultation. Please call with questions.   Eulis FosterKristen Emunah Texidor, MD

## 2014-07-27 ENCOUNTER — Inpatient Hospital Stay (HOSPITAL_COMMUNITY)
Admission: AD | Admit: 2014-07-27 | Discharge: 2014-07-28 | Disposition: A | Discharge status: Home / Self Care | Payer: Self-pay | Source: Ambulatory Visit | Attending: Obstetrics | Admitting: Obstetrics

## 2014-07-27 ENCOUNTER — Encounter (HOSPITAL_COMMUNITY): Payer: Self-pay

## 2014-07-27 DIAGNOSIS — Z3A38 38 weeks gestation of pregnancy: Secondary | ICD-10-CM | POA: Insufficient documentation

## 2014-07-27 NOTE — MAU Note (Signed)
Contractions every 5-10 minutes x 2 hours. Denies LOF or vaginal bleeding. Positive fetal movement. Cerclage removed last month.

## 2014-07-28 NOTE — Discharge Instructions (Signed)
Contracciones de Braxton Hicks (Braxton Hicks Contractions) Durante el embarazo, pueden presentarse contracciones uterinas que no siempre indican que est en trabajo de parto.  QU SON LAS CONTRACCIONES DE BRAXTON HICKS?  Las contracciones que se presentan antes del trabajo de parto se conocen como contracciones de Braxton Hicks o falso trabajo de parto. Hacia el final del embarazo (32 a 34semanas), estas contracciones pueden aparecen con ms frecuencia y volverse ms intensas. No corresponden al trabajo de parto verdadero porque estas contracciones no producen el agrandamiento (la dilatacin) y el afinamiento del cuello del tero. Algunas veces, es difcil distinguirlas del trabajo de parto verdadero porque en algunos casos pueden ser muy intensas, y las personas tienen diferentes niveles de tolerancia al dolor. No debe sentirse avergonzada si concurre al hospital con falso trabajo de parto. En ocasiones, la nica forma de saber si el trabajo de parto es verdadero es que el mdico determine si hay cambios en el cuello del tero. Si no hay problemas prenatales u otras complicaciones de salud asociadas con el embarazo, no habr inconvenientes si la envan a su casa con falso trabajo de parto y espera que comience el verdadero. CMO DIFERENCIAR EL TRABAJO DE PARTO FALSO DEL VERDADERO Falso trabajo de parto  Las contracciones del falso trabajo de parto duran menos y no son tan intensas como las verdaderas.  Generalmente son irregulares.  A menudo, se sienten en la parte delantera de la parte baja del abdomen y en la ingle,  y pueden desaparecer cuando camina o cambia de posicin mientras est acostada.  Las contracciones se vuelven ms dbiles y su duracin es menor a medida que el tiempo transcurre.  Por lo general, no se hacen progresivamente ms intensas, regulares y cercanas entre s como en el caso del trabajo de parto verdadero. Verdadero trabajo de parto 1. Las contracciones del verdadero  trabajo de parto duran de 30 a 70segundos, son muy regulares y suelen volverse ms intensas, y aumenta su frecuencia. 2. No desaparecen cuando camina. 3. La molestia generalmente se siente en la parte superior del tero y se extiende hacia la zona inferior del abdomen y hacia la cintura. 4. El mdico podr examinarla para determinar si el trabajo de parto es verdadero. El examen mostrar si el cuello del tero se est dilatando y afinando. LO QUE DEBE RECORDAR  Contine haciendo los ejercicios habituales y siga otras indicaciones que el mdico le d.  Tome todos los medicamentos como le indic el mdico.  Concurra a las visitas prenatales regulares.  Coma y beba con moderacin si cree que est en trabajo de parto.  Si las contracciones de Braxton Hicks le provocan incomodidad:  Cambie de posicin: si est acostada o descansando, camine; si est caminando, descanse.  Sintese y descanse en una baera con agua tibia.  Beba 2 o 3vasos de agua. La deshidratacin puede provocar contracciones.  Respire lenta y profundamente varias veces por hora. CUNDO DEBO BUSCAR ASISTENCIA MDICA INMEDIATA? Solicite atencin mdica de inmediato si:  Las contracciones se intensifican, se hacen ms regulares y cercanas entre s.  Tiene una prdida de lquido por la vagina.  Tiene fiebre.  Elimina mucosidad manchada con sangre.  Tiene una hemorragia vaginal abundante.  Tiene dolor abdominal permanente.  Tiene un dolor en la zona lumbar que nunca tuvo antes.  Siente que la cabeza del beb empuja hacia abajo y ejerce presin en la zona plvica.  El beb no se mueve tanto como sola. Document Released: 12/13/2004 Document Revised: 03/10/2013 Exit[employer] Patient   Information 2015 [employer]. This information is not intended to replace advice given to you by your health care provider. Make sure you discuss any questions you have with your health care provider.  Evaluacin de los movimientos  fetales  (Fetal Movement Counts) Nombre del paciente: __________________________________________________ Fecha de parto estimada: ____________________ La evaluacin de los movimientos fetales es muy recomendable en los embarazos de alto riesgo, pero tambin es una buena idea que lo hagan todas las embarazadas. El mdico le indicar que comience a contarlos a las 28 semanas de embarazo. Los movimientos fetales suelen aumentar:   Despus de una comida completa.  Despus de la actividad fsica.  Despus de comer o beber algo dulce o fro.  En reposo. Preste atencin cuando sienta que el beb est ms activo. Esto le ayudar a notar un patrn de ciclos de vigilia y sueo de su beb y cules son los factores que contribuyen a un aumento de los movimientos fetales. Es importante llevar a cabo un recuento de movimientos fetales, al mismo tiempo cada da, cuando el beb normalmente est ms activo.  CMO CONTAR LOS MOVIMIENTOS FETALES 5. Busque un lugar tranquilo y cmodo para sentarse o recostarse sobre el lado izquierdo. Al recostarse sobre su lado izquierdo, le proporciona una mejor circulacin de sangre y oxgeno al beb. 6. Anote el da y la hora en una hoja de papel o en un diario. 7. Comience contando las pataditas, revoloteos, chasquidos, vueltas o pinchazos en un perodo de 2 horas. Debe sentir al menos 10 movimientos en 2 horas. 8. Si no siente 10 movimientos en 2 horas, espere 2  3 horas y cuente de nuevo. Busque cambios en el patrn o si no cuenta lo suficiente en 2 horas. SOLICITE ATENCIN MDICA SI:   Siente menos de 10 pataditas en 2 horas, en dos intentos.  No hay movimientos durante una hora.  El patrn se modifica o le lleva ms tiempo cada da contar las 10 pataditas.  Siente que el beb no se mueve como lo hace habitualmente. Fecha: ____________ Movimientos: ____________ Hora de inicio: ____________ Hora de finalizacin: ____________  Fecha: ____________ Movimientos:  ____________ Hora de inicio: ____________ Hora de finalizacin: ____________  Fecha: ____________ Movimientos: ____________ Hora de inicio: ____________ Hora de finalizacin: ____________  Fecha: ____________ Movimientos: ____________ Hora de inicio: ____________ Hora de finalizacin: ____________  Fecha: ____________ Movimientos: ____________ Hora de inicio: ____________ Hora de finalizacin: ____________  Fecha: ____________ Movimientos: ____________ Hora de inicio: ____________ Hora de finalizacin: ____________  Fecha: ____________ Movimientos: ____________ Hora de inicio: ____________ Hora de finalizacin: ____________  Fecha: ____________ Movimientos: ____________ Hora de inicio: ____________ Hora de finalizacin: ____________  Fecha: ____________ Movimientos: ____________ Hora de inicio: ____________ Hora de finalizacin: ____________  Fecha: ____________ Movimientos: ____________ Hora de inicio: ____________ Hora de finalizacin: ____________  Fecha: ____________ Movimientos: ____________ Hora de inicio: ____________ Hora de finalizacin: ____________  Fecha: ____________ Movimientos: ____________ Hora de inicio: ____________ Hora de finalizacin: ____________  Fecha: ____________ Movimientos: ____________ Hora de inicio: ____________ Hora de finalizacin: ____________  Fecha: ____________ Movimientos: ____________ Hora de inicio: ____________ Hora de finalizacin: ____________  Fecha: ____________ Movimientos: ____________ Hora de inicio: ____________ Hora de finalizacin: ____________  Fecha: ____________ Movimientos: ____________ Hora de inicio: ____________ Hora de finalizacin: ____________  Fecha: ____________ Movimientos: ____________ Hora de inicio: ____________ Hora de finalizacin: ____________  Fecha: ____________ Movimientos: ____________ Hora de inicio: ____________ Hora de finalizacin: ____________  Fecha: ____________ Movimientos: ____________ Hora de inicio: ____________  Hora de finalizacin: ____________  Fecha:   ____________ Movimientos: ____________ Hora de inicio: ____________ Hora de finalizacin: ____________  Fecha: ____________ Movimientos: ____________ Hora de inicio: ____________ Hora de finalizacin: ____________  Fecha: ____________ Movimientos: ____________ Hora de inicio: ____________ Hora de finalizacin: ____________  Fecha: ____________ Movimientos: ____________ Hora de inicio: ____________ Hora de finalizacin: ____________  Fecha: ____________ Movimientos: ____________ Hora de inicio: ____________ Hora de finalizacin: ____________  Fecha: ____________ Movimientos: ____________ Hora de inicio: ____________ Hora de finalizacin: ____________  Fecha: ____________ Movimientos: ____________ Hora de inicio: ____________ Hora de finalizacin: ____________  Fecha: ____________ Movimientos: ____________ Hora de inicio: ____________ Hora de finalizacin: ____________  Fecha: ____________ Movimientos: ____________ Hora de inicio: ____________ Hora de finalizacin: ____________  Fecha: ____________ Movimientos: ____________ Hora de inicio: ____________ Hora de finalizacin: ____________  Fecha: ____________ Movimientos: ____________ Hora de inicio: ____________ Hora de finalizacin: ____________  Fecha: ____________ Movimientos: ____________ Hora de inicio: ____________ Hora de finalizacin: ____________  Fecha: ____________ Movimientos: ____________ Hora de inicio: ____________ Hora de finalizacin: ____________  Fecha: ____________ Movimientos: ____________ Hora de inicio: ____________ Hora de finalizacin: ____________  Fecha: ____________ Movimientos: ____________ Hora de inicio: ____________ Hora de finalizacin: ____________  Fecha: ____________ Movimientos: ____________ Hora de inicio: ____________ Hora de finalizacin: ____________  Fecha: ____________ Movimientos: ____________ Hora de inicio: ____________ Hora de finalizacin: ____________  Fecha:  ____________ Movimientos: ____________ Hora de inicio: ____________ Hora de finalizacin: ____________  Fecha: ____________ Movimientos: ____________ Hora de inicio: ____________ Hora de finalizacin: ____________  Fecha: ____________ Movimientos: ____________ Hora de inicio: ____________ Hora de finalizacin: ____________  Fecha: ____________ Movimientos: ____________ Hora de inicio: ____________ Hora de finalizacin: ____________  Fecha: ____________ Movimientos: ____________ Hora de inicio: ____________ Hora de finalizacin: ____________  Fecha: ____________ Movimientos: ____________ Hora de inicio: ____________ Hora de finalizacin: ____________  Fecha: ____________ Movimientos: ____________ Hora de inicio: ____________ Hora de finalizacin: ____________  Fecha: ____________ Movimientos: ____________ Hora de inicio: ____________ Hora de finalizacin: ____________  Fecha: ____________ Movimientos: ____________ Hora de inicio: ____________ Hora de finalizacin: ____________  Fecha: ____________ Movimientos: ____________ Hora de inicio: ____________ Hora de finalizacin: ____________  Fecha: ____________ Movimientos: ____________ Hora de inicio: ____________ Hora de finalizacin: ____________  Fecha: ____________ Movimientos: ____________ Hora de inicio: ____________ Hora de finalizacin: ____________  Fecha: ____________ Movimientos: ____________ Hora de inicio: ____________ Hora de finalizacin: ____________  Fecha: ____________ Movimientos: ____________ Hora de inicio: ____________ Hora de finalizacin: ____________  Fecha: ____________ Movimientos: ____________ Hora de inicio: ____________ Hora de finalizacin: ____________  Fecha: ____________ Movimientos: ____________ Hora de inicio: ____________ Hora de finalizacin: ____________  Fecha: ____________ Movimientos: ____________ Hora de inicio: ____________ Hora de finalizacin: ____________  Fecha: ____________ Movimientos: ____________ Hora  de inicio: ____________ Hora de finalizacin: ____________  Fecha: ____________ Movimientos: ____________ Hora de inicio: ____________ Hora de finalizacin: ____________  Fecha: ____________ Movimientos: ____________ Hora de inicio: ____________ Hora de finalizacin: ____________  Document Released: 06/12/2007 Document Revised: 02/20/2012 Exit[employer] Patient Information 2015 [employer]. This information is not intended to replace advice given to you by your health care provider. Make sure you discuss any questions you have with your health care provider.  

## 2014-08-12 ENCOUNTER — Telehealth (HOSPITAL_COMMUNITY): Payer: Self-pay | Admitting: *Deleted

## 2014-08-12 LAB — OB RESULTS CONSOLE GBS: GBS: NEGATIVE

## 2014-08-12 NOTE — Telephone Encounter (Signed)
Preadmission screen  

## 2014-08-14 ENCOUNTER — Encounter (HOSPITAL_COMMUNITY): Payer: Self-pay

## 2014-08-14 ENCOUNTER — Inpatient Hospital Stay (HOSPITAL_COMMUNITY): Payer: Medicaid Other | Admitting: Anesthesiology

## 2014-08-14 ENCOUNTER — Inpatient Hospital Stay (HOSPITAL_COMMUNITY)
Admission: RE | Admit: 2014-08-14 | Discharge: 2014-08-16 | DRG: 775 | Disposition: A | Discharge status: Home / Self Care | Payer: Medicaid Other | Source: Ambulatory Visit | Attending: Obstetrics | Admitting: Obstetrics

## 2014-08-14 DIAGNOSIS — Z3483 Encounter for supervision of other normal pregnancy, third trimester: Secondary | ICD-10-CM | POA: Diagnosis present

## 2014-08-14 DIAGNOSIS — Z3A40 40 weeks gestation of pregnancy: Secondary | ICD-10-CM | POA: Diagnosis present

## 2014-08-14 DIAGNOSIS — O09523 Supervision of elderly multigravida, third trimester: Secondary | ICD-10-CM

## 2014-08-14 DIAGNOSIS — O480 Post-term pregnancy: Secondary | ICD-10-CM | POA: Diagnosis present

## 2014-08-14 LAB — CBC
HCT: 35 % — ABNORMAL LOW (ref 36.0–46.0)
HCT: 32.4 % — ABNORMAL LOW (ref 36.0–46.0)
HEMOGLOBIN: 11.9 g/dL — AB (ref 12.0–15.0)
Hemoglobin: 10.9 g/dL — ABNORMAL LOW (ref 12.0–15.0)
MCH: 31.6 pg (ref 26.0–34.0)
MCH: 31.5 pg (ref 26.0–34.0)
MCHC: 34 g/dL (ref 30.0–36.0)
MCHC: 33.6 g/dL (ref 30.0–36.0)
MCV: 93.1 fL (ref 78.0–100.0)
MCV: 93.6 fL (ref 78.0–100.0)
Platelets: 106 10*3/uL — ABNORMAL LOW (ref 150–400)
Platelets: 105 10*3/uL — ABNORMAL LOW (ref 150–400)
RBC: 3.76 MIL/uL — AB (ref 3.87–5.11)
RBC: 3.46 MIL/uL — ABNORMAL LOW (ref 3.87–5.11)
RDW: 14.1 % (ref 11.5–15.5)
RDW: 14.3 % (ref 11.5–15.5)
WBC: 8.5 10*3/uL (ref 4.0–10.5)
WBC: 11.6 10*3/uL — ABNORMAL HIGH (ref 4.0–10.5)

## 2014-08-14 LAB — TYPE AND SCREEN
ABO/RH(D): O POS
Antibody Screen: NEGATIVE

## 2014-08-14 MED ORDER — METHYLERGONOVINE MALEATE 0.2 MG/ML IJ SOLN: 0.2000 mg | INTRAMUSCULAR | Status: DC | PRN

## 2014-08-14 MED ORDER — OXYCODONE-ACETAMINOPHEN 5-325 MG PO TABS: 1.0000 | ORAL_TABLET | ORAL | Status: DC | PRN

## 2014-08-14 MED ORDER — LACTATED RINGERS IV SOLN: 500.0000 mL | INTRAVENOUS | Status: DC | PRN

## 2014-08-14 MED ORDER — LACTATED RINGERS IV SOLN: INTRAVENOUS | Status: DC

## 2014-08-14 MED ORDER — ZOLPIDEM TARTRATE 5 MG PO TABS: 5.0000 mg | ORAL_TABLET | Freq: Every evening | ORAL | Status: DC | PRN

## 2014-08-14 MED ORDER — ONDANSETRON HCL 4 MG/2ML IJ SOLN: 4.0000 mg | Freq: Four times a day (QID) | INTRAMUSCULAR | Status: DC | PRN

## 2014-08-14 MED ORDER — ACETAMINOPHEN 325 MG PO TABS: 650.0000 mg | ORAL_TABLET | ORAL | Status: DC | PRN

## 2014-08-14 MED ORDER — ONDANSETRON HCL 4 MG/2ML IJ SOLN: 4.0000 mg | INTRAMUSCULAR | Status: DC | PRN

## 2014-08-14 MED ORDER — IBUPROFEN 600 MG PO TABS
600.0000 mg | ORAL_TABLET | Freq: Four times a day (QID) | ORAL | Status: DC
  Administered 2014-08-14 – 2014-08-16 (×4): 600 mg via ORAL
  Filled 2014-08-14 (×5): qty 1

## 2014-08-14 MED ORDER — OXYTOCIN 40 UNITS IN LACTATED RINGERS INFUSION - SIMPLE MED: 62.5000 mL/h | INTRAVENOUS | Status: DC

## 2014-08-14 MED ORDER — CITRIC ACID-SODIUM CITRATE 334-500 MG/5ML PO SOLN: 30.0000 mL | ORAL | Status: DC | PRN

## 2014-08-14 MED ORDER — LIDOCAINE HCL (PF) 1 % IJ SOLN: 30.0000 mL | INTRAMUSCULAR | Status: DC | PRN

## 2014-08-14 MED ORDER — FLEET ENEMA 7-19 GM/118ML RE ENEM: 1.0000 | ENEMA | Freq: Once | RECTAL | Status: DC

## 2014-08-14 MED ORDER — ACETAMINOPHEN 325 MG PO TABS
650.0000 mg | ORAL_TABLET | ORAL | Status: DC | PRN
  Administered 2014-08-16: 650 mg via ORAL

## 2014-08-14 MED ORDER — LANOLIN HYDROUS EX OINT: TOPICAL_OINTMENT | CUTANEOUS | Status: DC | PRN

## 2014-08-14 MED ORDER — OXYCODONE-ACETAMINOPHEN 5-325 MG PO TABS
2.0000 | ORAL_TABLET | ORAL | Status: DC | PRN
  Administered 2014-08-15: 2 via ORAL
  Filled 2014-08-14: qty 2

## 2014-08-14 MED ORDER — PRENATAL MULTIVITAMIN CH
1.0000 | ORAL_TABLET | Freq: Every day | ORAL | Status: DC
  Administered 2014-08-15: 1 via ORAL
  Filled 2014-08-14: qty 1

## 2014-08-14 MED ORDER — LACTATED RINGERS IV SOLN
INTRAVENOUS | Status: DC
  Administered 2014-08-14 (×2): via INTRAVENOUS

## 2014-08-14 MED ORDER — OXYCODONE-ACETAMINOPHEN 5-325 MG PO TABS
1.0000 | ORAL_TABLET | ORAL | Status: DC | PRN
  Administered 2014-08-15: 1 via ORAL
  Filled 2014-08-14: qty 1

## 2014-08-14 MED ORDER — EPHEDRINE 5 MG/ML INJ: 10.0000 mg | INTRAVENOUS | Status: DC | PRN

## 2014-08-14 MED ORDER — FENTANYL 2.5 MCG/ML BUPIVACAINE 1/10 % EPIDURAL INFUSION (WH - ANES)
14.0000 mL/h | INTRAMUSCULAR | Status: DC | PRN
  Administered 2014-08-14: 14 mL/h via EPIDURAL
  Administered 2014-08-14: 12 mL/h via EPIDURAL
  Administered 2014-08-14: 14 mL/h via EPIDURAL
  Filled 2014-08-14 (×2): qty 125

## 2014-08-14 MED ORDER — METHYLERGONOVINE MALEATE 0.2 MG/ML IJ SOLN
INTRAMUSCULAR | Status: AC
  Filled 2014-08-14: qty 1

## 2014-08-14 MED ORDER — OXYCODONE-ACETAMINOPHEN 5-325 MG PO TABS: 2.0000 | ORAL_TABLET | ORAL | Status: DC | PRN

## 2014-08-14 MED ORDER — METHYLERGONOVINE MALEATE 0.2 MG PO TABS
0.2000 mg | ORAL_TABLET | ORAL | Status: DC | PRN
  Administered 2014-08-14 – 2014-08-15 (×3): 0.2 mg via ORAL
  Filled 2014-08-14 (×3): qty 1

## 2014-08-14 MED ORDER — TERBUTALINE SULFATE 1 MG/ML IJ SOLN
0.2500 mg | Freq: Once | INTRAMUSCULAR | Status: DC | PRN
Start: 2014-08-14 — End: 2014-08-14

## 2014-08-14 MED ORDER — WITCH HAZEL-GLYCERIN EX PADS
1.0000 "application " | MEDICATED_PAD | CUTANEOUS | Status: DC | PRN
  Administered 2014-08-15: 1 via TOPICAL

## 2014-08-14 MED ORDER — LIDOCAINE HCL (PF) 1 % IJ SOLN
INTRAMUSCULAR | Status: DC | PRN
  Administered 2014-08-14 (×2): 4 mL

## 2014-08-14 MED ORDER — FLEET ENEMA 7-19 GM/118ML RE ENEM: 1.0000 | ENEMA | Freq: Every day | RECTAL | Status: DC | PRN

## 2014-08-14 MED ORDER — OXYTOCIN BOLUS FROM INFUSION: 500.0000 mL | INTRAVENOUS | Status: DC

## 2014-08-14 MED ORDER — OXYTOCIN 40 UNITS IN LACTATED RINGERS INFUSION - SIMPLE MED
1.0000 m[IU]/min | INTRAVENOUS | Status: DC
  Administered 2014-08-14: 2 m[IU]/min via INTRAVENOUS
  Filled 2014-08-14: qty 1000

## 2014-08-14 MED ORDER — LACTATED RINGERS IV SOLN
500.0000 mL | INTRAVENOUS | Status: DC | PRN
  Administered 2014-08-14 (×2): 500 mL via INTRAVENOUS
  Administered 2014-08-14: 1000 mL via INTRAVENOUS

## 2014-08-14 MED ORDER — OXYCODONE-ACETAMINOPHEN 5-325 MG PO TABS
2.0000 | ORAL_TABLET | ORAL | Status: DC | PRN
  Administered 2014-08-14: 2 via ORAL
  Filled 2014-08-14: qty 2

## 2014-08-14 MED ORDER — BENZOCAINE-MENTHOL 20-0.5 % EX AERO
1.0000 "application " | INHALATION_SPRAY | CUTANEOUS | Status: DC | PRN
  Administered 2014-08-15: 1 via TOPICAL
  Filled 2014-08-14 (×2): qty 56

## 2014-08-14 MED ORDER — PHENYLEPHRINE 40 MCG/ML (10ML) SYRINGE FOR IV PUSH (FOR BLOOD PRESSURE SUPPORT)
80.0000 ug | PREFILLED_SYRINGE | INTRAVENOUS | Status: DC | PRN
  Filled 2014-08-14: qty 20

## 2014-08-14 MED ORDER — DIBUCAINE 1 % RE OINT
1.0000 "application " | TOPICAL_OINTMENT | RECTAL | Status: DC | PRN
  Filled 2014-08-14: qty 28

## 2014-08-14 MED ORDER — TETANUS-DIPHTH-ACELL PERTUSSIS 5-2.5-18.5 LF-MCG/0.5 IM SUSP
0.5000 mL | Freq: Once | INTRAMUSCULAR | Status: AC
  Administered 2014-08-15: 0.5 mL via INTRAMUSCULAR
  Filled 2014-08-14: qty 0.5

## 2014-08-14 MED ORDER — MISOPROSTOL 200 MCG PO TABS
ORAL_TABLET | ORAL | Status: AC
  Filled 2014-08-14: qty 5

## 2014-08-14 MED ORDER — BUTORPHANOL TARTRATE 1 MG/ML IJ SOLN: 1.0000 mg | INTRAMUSCULAR | Status: DC | PRN

## 2014-08-14 MED ORDER — FERROUS SULFATE 325 (65 FE) MG PO TABS
325.0000 mg | ORAL_TABLET | Freq: Two times a day (BID) | ORAL | Status: DC
  Administered 2014-08-15: 325 mg via ORAL
  Filled 2014-08-14 (×2): qty 1

## 2014-08-14 MED ORDER — ONDANSETRON HCL 4 MG PO TABS: 4.0000 mg | ORAL_TABLET | ORAL | Status: DC | PRN

## 2014-08-14 MED ORDER — DIPHENHYDRAMINE HCL 25 MG PO CAPS: 25.0000 mg | ORAL_CAPSULE | Freq: Four times a day (QID) | ORAL | Status: DC | PRN

## 2014-08-14 MED ORDER — LIDOCAINE HCL (PF) 1 % IJ SOLN
30.0000 mL | INTRAMUSCULAR | Status: DC | PRN
  Administered 2014-08-14: 30 mL via SUBCUTANEOUS
  Filled 2014-08-14: qty 30

## 2014-08-14 MED ORDER — SIMETHICONE 80 MG PO CHEW
80.0000 mg | CHEWABLE_TABLET | ORAL | Status: DC | PRN
Start: 2014-08-14 — End: 2014-08-16

## 2014-08-14 MED ORDER — SENNOSIDES-DOCUSATE SODIUM 8.6-50 MG PO TABS
2.0000 | ORAL_TABLET | ORAL | Status: DC
  Administered 2014-08-14: 2 via ORAL
  Filled 2014-08-14 (×2): qty 2

## 2014-08-14 MED ORDER — DIPHENHYDRAMINE HCL 50 MG/ML IJ SOLN: 12.5000 mg | INTRAMUSCULAR | Status: DC | PRN

## 2014-08-14 NOTE — Anesthesia Preprocedure Evaluation (Signed)
Anesthesia Evaluation  Patient identified by MRN, date of birth, ID band Patient awake    Reviewed: Allergy & Precautions, NPO status , Patient's Chart, lab work & pertinent test results  History of Anesthesia Complications Negative for: history of anesthetic complications  Airway Mallampati: II  TM Distance: >3 FB Neck ROM: Full    Dental no notable dental hx. (+) Dental Advisory Given   Pulmonary neg pulmonary ROS,  breath sounds clear to auscultation  Pulmonary exam normal       Cardiovascular negative cardio ROS Normal cardiovascular examRhythm:Regular Rate:Normal     Neuro/Psych negative neurological ROS  negative psych ROS   GI/Hepatic negative GI ROS, Neg liver ROS,   Endo/Other  obesity  Renal/GU negative Renal ROS  negative genitourinary   Musculoskeletal negative musculoskeletal ROS (+)   Abdominal   Peds negative pediatric ROS (+)  Hematology Thrombocytopenia, plts 106 this am, will obtain count prior to d/c epidural   Anesthesia Other Findings   Reproductive/Obstetrics (+) Pregnancy                             Anesthesia Physical Anesthesia Plan  ASA: II  Anesthesia Plan: Epidural   Post-op Pain Management:    Induction:   Airway Management Planned:   Additional Equipment:   Intra-op Plan:   Post-operative Plan:   Informed Consent: I have reviewed the patients History and Physical, chart, labs and discussed the procedure including the risks, benefits and alternatives for the proposed anesthesia with the patient or authorized representative who has indicated his/her understanding and acceptance.   Dental advisory given  Plan Discussed with:   Anesthesia Plan Comments:         Anesthesia Quick Evaluation

## 2014-08-14 NOTE — Progress Notes (Signed)
Patient ID: Aimee Benjamin, female   DOB: 10/17/1980, 34 y.o.   MRN: [id number] Patient pushed for 40 minutes and the midline episiotomy was done she had variables with each contraction and  Had a nvd    from the LOA position of a female Apgar 1 and 9   the team was in attendance  And  cord which was cut prior to delivery the fourth degree was repaired in layers and the repair tested and the rectum and anus were intact

## 2014-08-14 NOTE — Anesthesia Procedure Notes (Signed)
Epidural Patient location during procedure: OB  Staffing Anesthesiologist: Scheryl Sanborn Performed by: anesthesiologist   Preanesthetic Checklist Completed: patient identified, site marked, surgical consent, pre-op evaluation, timeout performed, IV checked, risks and benefits discussed and monitors and equipment checked  Epidural Patient position: sitting Prep: site prepped and draped and DuraPrep Patient monitoring: continuous pulse ox and blood pressure Approach: midline Location: L3-L4 Injection technique: LOR saline  Needle:  Needle type: Tuohy  Needle gauge: 17 G Needle length: 9 cm and 9 Needle insertion depth: 6 cm Catheter type: closed end flexible Catheter size: 19 Gauge Catheter at skin depth: 10 cm Test dose: negative  Assessment Events: blood not aspirated, injection not painful, no injection resistance, negative IV test and no paresthesia  Additional Notes Patient identified. Risks/Benefits/Options discussed with patient including but not limited to bleeding, infection, nerve damage, paralysis, failed block, incomplete pain control, headache, blood pressure changes, nausea, vomiting, reactions to medication both or allergic, itching and postpartum back pain. Confirmed with bedside nurse the patient's most recent platelet count. Confirmed with patient that they are not currently taking any anticoagulation, have any bleeding history or any family history of bleeding disorders. Patient expressed understanding and wished to proceed. All questions were answered. Sterile technique was used throughout the entire procedure. Please see nursing notes for vital signs. Test dose was given through epidural catheter and negative prior to continuing to dose epidural or start infusion. Warning signs of high block given to the patient including shortness of breath, tingling/numbness in hands, complete motor block, or any concerning symptoms with instructions to call for help. Patient was  given instructions on fall risk and not to get out of bed. All questions and concerns addressed with instructions to call with any issues or inadequate analgesia.    

## 2014-08-14 NOTE — Progress Notes (Signed)
0740 interpreter at bs for induction interview and admission.

## 2014-08-14 NOTE — Progress Notes (Signed)
1604 in room to perform sve and update dr Gaynell Facemarshall. Towel removed between pt's legs. Large amount of bleeding noted with large clot on towel. Pt c/o uc's rated a 7 x 30 minutes. Interpreter at bs. sve 6/100/-1. Bbow. 131cc blood loss noted per scale. uc's q 1 minute x last 25-30 minutes, fhr category 1. Dr Gaynell Facemarshall called and given update-coming to see pt and order given to d/c pitocin.

## 2014-08-14 NOTE — H&P (Signed)
This is Dr. Francoise CeoBernard Marshall dictating the history and physical on  Aimee Benjamin  she is a 34 year old gravida 7 para 1051 negative GBS at 40 weeks and 5 days brought in for induction the patient has a history of incompetent cervix and had a cervical cerclage placed which was removed at 35 weeks she was brought in for induction and on admission cervix was 4 cm 90% she is now 9 cm 100% with the vertex at -1 station was called because the patient had a gush of blood with clots and on exam her membranes were intact membranes ruptured artificially fluids clear and minimal bleeding noted she is on low-dose Pitocin Past surgical history she had a cervical cerclage placement this pregnancy Past medical history history of incompetent cervix Social history nonsmoker nondrinker no drug use Family history negative for diabetes high blood pressure System review noncontributory Physical exam well-developed female in labor HEENT negative Lungs clear to P&A Heart regular rhythm no murmurs no gallops Breasts negative Abdomen term Pelvic as described above Extremities negative

## 2014-08-14 NOTE — Progress Notes (Signed)
Interpreter @ bedside for pushing/delivery

## 2014-08-15 LAB — RPR: RPR Ser Ql: NONREACTIVE

## 2014-08-15 LAB — HIV ANTIBODY (ROUTINE TESTING W REFLEX): HIV Screen 4th Generation wRfx: NONREACTIVE

## 2014-08-15 LAB — CBC
HCT: 27.9 % — ABNORMAL LOW (ref 36.0–46.0)
Hemoglobin: 9.5 g/dL — ABNORMAL LOW (ref 12.0–15.0)
MCH: 31.4 pg (ref 26.0–34.0)
MCHC: 34.1 g/dL (ref 30.0–36.0)
MCV: 92.1 fL (ref 78.0–100.0)
Platelets: 94 10*3/uL — ABNORMAL LOW (ref 150–400)
RBC: 3.03 MIL/uL — ABNORMAL LOW (ref 3.87–5.11)
RDW: 14.2 % (ref 11.5–15.5)
WBC: 15.1 10*3/uL — ABNORMAL HIGH (ref 4.0–10.5)

## 2014-08-15 NOTE — Progress Notes (Signed)
rn in room and pt up earlier with CNA and used bathroom, ? Urinated vs blood drainage. Pt stated could not urinate. Pt abdomen fundau  UE and firm. Bladder scaned for 214 cc's plan to continue to watch bladder

## 2014-08-15 NOTE — Progress Notes (Signed)
INTERPRETER IN ROOM AND TRANSLATING FOR CENTRAL NURSERY RN AND MBU RN... BREASTFEEDING INSTRUCTIONS DISCUSSED WITH PT.PT INSTRUCTED TO CALL RN IF ANY NEEDS. PT INSTRUCTED IN PAIN MED, 4 TH DEGREE TEAR AND CARE OF TEAR. AND INFANT TO BREAST TO FEED

## 2014-08-15 NOTE — Anesthesia Postprocedure Evaluation (Signed)
Anesthesia Post Note  Patient: Aimee Benjamin  Procedure(s) Performed: * No procedures listed *  Anesthesia type: Epidural  Patient location: Mother/Baby  Post pain: Pain level controlled  Post assessment: Post-op Vital signs reviewed  Last Vitals:  Filed Vitals:   08/15/14 0439  BP: 121/76  Pulse: 90  Temp: 36.9 C  Resp: 20    Post vital signs: Reviewed  Level of consciousness:alert  Complications: No apparent anesthesia complications

## 2014-08-15 NOTE — Progress Notes (Signed)
Checked on patient needs. Assisted RN with interpretation of patient questions. °Spanish Interpreter °

## 2014-08-15 NOTE — Progress Notes (Signed)
Patient states pain of 9, but no pain behaviors noted such as writhing, sweating, rocking, no facial expression indicating pain (smiling, talking, feeding baby).  Patient given motrin, heat, encouraged to walk (which she has not been doing).

## 2014-08-15 NOTE — Progress Notes (Signed)
Checked on patient needs  °Spanish Interpreter °

## 2014-08-15 NOTE — Progress Notes (Signed)
Patient ID: Aimee Benjamin, female   DOB: 09/08/1980, 34 y.o.   MRN: [id number] Postpartum day one Blood pressure 121/76 respiration 20 pulse 90 Fundus firm Lochia moderate Legs negative Doing well

## 2014-08-15 NOTE — Lactation Note (Signed)
This note was copied from the chart of Aimee Benjamin. Lactation Consultation Note  Patient Name: Aimee Benjamin [id number]'s Date: 08/15/2014 Reason for consult: Initial assessment;Other (Comment) (limited English - [name]( [facility] Spanish interpreter present ) )  Baby is 419 hours old and has been to the breast several times and attempts. MBU RN had given mom a hand pump. LC assessed breast tissue with moms permission and reviewed basics.  LC noted erect nipples , short shaft and swollen areolas, after breast massage , hand express, pre - pump and reverse pressure  , steady flow of colostrum, baby awake and opened wide and multiply swallows noted , increased with breast compressions.  Baby fed 24 mins and nipple appeared normal shape after release. Per interpreter per mom , intermittent pinching noted.  Pinching is probably due to swelling, mom instructed on the use shells. Per mom had to use them with her 1st baby.   Also used a Nipple shield. LC felt due to such a good latch, and consistent pattern use of nipple shield can be reassessed.  In the mean time shells when the baby isn't skin to skin and steps for latching described above.  Mom receptive to teaching.  Per mom active with WIC  - [place]  Mother informed of post-discharge support and given phone number to the lactation department, including services for phone  call assistance; out-patient appointments; and breastfeeding support group. List of other breastfeeding resources in the community  given in the handout. Encouraged mother to call for problems or concerns related to breastfeeding.   Maternal Data Has patient been taught Hand Expression?: Yes  Feeding Feeding Type: Breast Fed  LATCH Score/Interventions - Latch score = 9 with LC , see doc flow sheets.                 Intervention(s): Breastfeeding basics reviewed     Lactation Tools Discussed/Used Tools: Shells;Pump Shell Type: Inverted Breast pump  type: Manual WIC Program: Yes (per mom [place] ) Pump Review: Setup, frequency, and cleaning Initiated by:: MAI  Date initiated:: 08/15/14   Consult Status Consult Status: Follow-up Date: 08/16/14 Follow-up type: In-patient    Kathrin Greathouseorio, Rodarius Kichline Ann 08/15/2014, 4:17 PM

## 2014-08-15 NOTE — Progress Notes (Signed)
Checked on patient needs. Assisted Lactation RN with interpretation of breastfeeding instructions. Spanish Interpreter

## 2014-08-16 ENCOUNTER — Ambulatory Visit: Payer: Self-pay

## 2014-08-16 NOTE — Progress Notes (Signed)
Patient ID: Aimee Benjamin, female   DOB: 10/22/1980, 34 y.o.   MRN: [id number] Postpartum day 2 Vital signs normal blood pressure 03/20/1965 respiration 18 pulse 91 No complaints Fundus firm Lochia moderate legs negative home today

## 2014-08-16 NOTE — Discharge Summary (Signed)
Obstetric Discharge Summary Reason for Admission: induction of labor Prenatal Procedures: none Intrapartum Procedures: spontaneous vaginal delivery Postpartum Procedures: none Complications-Operative and Postpartum: none HEMOGLOBIN  Date Value Ref Range Status  08/15/2014 9.5* 12.0 - 15.0 g/dL Final   HCT  Date Value Ref Range Status  08/15/2014 27.9* 36.0 - 46.0 % Final    Physical Exam:  General: alert Lochia: appropriate Uterine Fundus: firm Incision: healing well DVT Evaluation: No evidence of DVT seen on physical exam.  Discharge Diagnoses: Term Pregnancy-delivered  Discharge Information: Date: 08/16/2014 Activity: pelvic rest Diet: routine Medications: Percocet Condition: stable Instructions: refer to practice specific booklet Discharge to: home Follow-up Information    Follow up with Kathreen CosierMARSHALL,Jozie Wulf A, MD.   Specialty:  Obstetrics and Gynecology   Contact information:   [address] [place] [address] [phone]       Newborn Data: Live born female  Birth Weight: 8 lb 10.6 oz (3930 g) APGAR: 1, 8  Home with mother.  Pallie Swigert A 08/16/2014, 7:10 AM

## 2014-08-16 NOTE — Discharge Instructions (Signed)
Discharge instructions ° °· You can wash your hair °· Shower °· Eat what you want °· Drink what you want °· See me in 6 weeks °· Your ankles are going to swell more in the next 2 weeks than when pregnant °· No sex for 6 weeks ° ° °Latayvia Mandujano A, MD 08/16/2014 ° ° °

## 2014-08-16 NOTE — Lactation Note (Signed)
This note was copied from the chart of Aimee Savilla Benjamin. Lactation Consultation Note Follow up visit at 48 hours of age, with spanish interpreter.  Mom is offering baby a bottle of formula.  Baby is also on double phototherapy.  Baby has had several large bottles of formula today.  Mom reports she doesn't have milk and her nipples hurt.  Right nipple with compression bruising noted, hand expressed colostrum and rubbed into nipples.  Encouraged mom to work on hand expression prior to latch attempts.  Mom declines assist as this time.  Encouraged to call RN for assist as needed.   Mom has hand pump to stimulate breasts when offering bottles, declines DEBP at this time.  Mom breastfed her older child for 8 months and declined problems. LC to follow prior to D/c.   Patient Name: Aimee Benjamin OZDGU'YToday's Date: 08/16/2014 Reason for consult: Follow-up assessment;Difficult latch;Breast/nipple pain   Maternal Data    Feeding Feeding Type: Breast Fed Nipple Type: Slow - flow Length of feed: 10 min  LATCH Score/Interventions                      Lactation Tools Discussed/Used     Consult Status Consult Status: Follow-up Date: 08/17/14 Follow-up type: In-patient    Renella Steig, Arvella MerlesJana Lynn 08/16/2014, 8:25 PM

## 2014-08-17 ENCOUNTER — Ambulatory Visit: Payer: Self-pay

## 2014-08-17 NOTE — Lactation Note (Addendum)
This note was copied from the chart of Aimee Trenna Benjamin. Lactation Consultation Note Breast and bottle. DPT has been d/c'd and levels rechecked at 1500 today. Lab value will determine d/c home or not. FOB speaks English and denied need for interpreter. Asked mom how things were going. Discussed engorgement and prevention.  Discouraged formula as much d/t can cut back milk suppy. Reviewed supply and demand. Patient Name: Aimee Benjamin AVWUJ'WToday's Date: 08/17/2014 Reason for consult: Follow-up assessment   Maternal Data    Feeding    LATCH Score/Interventions                      Lactation Tools Discussed/Used     Consult Status Consult Status: Complete Date: 08/17/14    Charyl DancerCARVER, Pollie Poma G 08/17/2014, 9:16 AM

## 2015-10-19 ENCOUNTER — Other Ambulatory Visit: Payer: Medicaid Other

## 2015-10-19 DIAGNOSIS — Z3481 Encounter for supervision of other normal pregnancy, first trimester: Secondary | ICD-10-CM

## 2015-10-20 LAB — OBSTETRIC PANEL
Antibody Screen: NEGATIVE
BASOS ABS: 0 {cells}/uL (ref 0–200)
Basophils Relative: 0 %
Eosinophils Absolute: 210 cells/uL (ref 15–500)
Eosinophils Relative: 2 %
HCT: 39 % (ref 35.0–45.0)
HEMOGLOBIN: 13.2 g/dL (ref 11.7–15.5)
Hepatitis B Surface Ag: NEGATIVE
Lymphocytes Relative: 22 %
Lymphs Abs: 2310 cells/uL (ref 850–3900)
MCH: 30.3 pg (ref 27.0–33.0)
MCHC: 33.8 g/dL (ref 32.0–36.0)
MCV: 89.4 fL (ref 80.0–100.0)
MONOS PCT: 7 %
MPV: 11 fL (ref 7.5–12.5)
Monocytes Absolute: 735 cells/uL (ref 200–950)
Neutro Abs: 7245 cells/uL (ref 1500–7800)
Neutrophils Relative %: 69 %
PLATELETS: 206 10*3/uL (ref 140–400)
RBC: 4.36 MIL/uL (ref 3.80–5.10)
RDW: 13.9 % (ref 11.0–15.0)
RUBELLA: 4.45 {index} — AB (ref <0.90)
Rh Type: POSITIVE
WBC: 10.5 10*3/uL (ref 3.8–10.8)

## 2015-10-20 LAB — SICKLE CELL SCREEN: Sickle Cell Screen: NEGATIVE

## 2015-10-20 LAB — HIV ANTIBODY (ROUTINE TESTING W REFLEX): HIV: NONREACTIVE

## 2015-10-20 LAB — CULTURE, OB URINE: Organism ID, Bacteria: NO GROWTH

## 2015-10-27 ENCOUNTER — Encounter: Payer: Self-pay | Admitting: Internal Medicine

## 2015-10-27 ENCOUNTER — Ambulatory Visit (INDEPENDENT_AMBULATORY_CARE_PROVIDER_SITE_OTHER): Payer: Self-pay | Admitting: Internal Medicine

## 2015-10-27 VITALS — BP 125/69 | HR 95 | Temp 98.3°F | Wt 159.0 lb

## 2015-10-27 DIAGNOSIS — O09522 Supervision of elderly multigravida, second trimester: Secondary | ICD-10-CM | POA: Insufficient documentation

## 2015-10-27 DIAGNOSIS — Z3482 Encounter for supervision of other normal pregnancy, second trimester: Secondary | ICD-10-CM

## 2015-10-27 DIAGNOSIS — Z3490 Encounter for supervision of normal pregnancy, unspecified, unspecified trimester: Secondary | ICD-10-CM

## 2015-10-27 DIAGNOSIS — Z331 Pregnant state, incidental: Secondary | ICD-10-CM

## 2015-10-27 LAB — GLUCOSE, CAPILLARY
Comment 1: 1
Glucose-Capillary: 154 mg/dL — ABNORMAL HIGH (ref 65–99)

## 2015-10-27 LAB — POCT WET PREP (WET MOUNT)
CLUE CELLS WET PREP WHIFF POC: NEGATIVE
Trichomonas Wet Prep HPF POC: ABSENT

## 2015-10-27 MED ORDER — PYRIDOXINE HCL 25 MG PO TABS: 25.0000 mg | ORAL_TABLET | Freq: Three times a day (TID) | ORAL | 1 refills | Status: DC | PRN

## 2015-10-27 MED ORDER — PRENATAL VITAMINS 0.8 MG PO TABS: 1.0000 | ORAL_TABLET | Freq: Every day | ORAL | Status: DC

## 2015-10-27 NOTE — Patient Instructions (Addendum)
I want you to follow up with the high risk clinic for your next visit.   Segundo trimestre de Psychiatrist (Second Trimester of Pregnancy) El segundo trimestre va desde la semana13 hasta la 28, desde el cuarto hasta el sexto mes, y suele ser el momento en el que mejor se siente. Su organismo se ha adaptado a Charity fundraiser y comienza a Diplomatic Services operational officer. En general, las nuseas matutinas han disminuido o han desaparecido completamente, puede tener ms energa y un aumento de apetito. El segundo trimestre es tambin la poca en la que el feto se desarrolla rpidamente. Hacia el final del sexto mes, el feto mide aproximadamente 9pulgadas (23cm) y pesa alrededor de 1 libras (700g). Es probable que sienta que el beb se Teacher, English as a foreign language (da pataditas) entre las 18 y 20semanas del Psychiatrist. CAMBIOS EN EL ORGANISMO Su organismo atraviesa por muchos cambios durante el [place], y estos varan de Neomia Dear mujer a Educational psychologist.   Seguir [employer]. Notar que la parte baja del abdomen sobresale.  Podrn aparecer las primeras Albertson's caderas, el abdomen y las [place].  Es posible que tenga dolores de cabeza que pueden aliviarse con los medicamentos que el mdico le permita tomar.  Tal vez tenga necesidad de orinar con ms frecuencia porque el feto est ejerciendo presin Ambulance person.  Debido al Vanetta Mulders podr sentir Anthoney Harada estomacal con frecuencia.  Puede estar estreida, ya que ciertas hormonas enlentecen los movimientos de los msculos que [employer] desechos a travs de los intestinos.  Pueden aparecer hemorroides o abultarse e hincharse las venas (venas varicosas).  Puede tener dolor de espalda que se debe al Citigroup de peso y a que las hormonas del Management consultant las articulaciones entre los huesos de la pelvis, y Public librarian consecuencia de la modificacin del peso y los msculos que mantienen el equilibrio.  Las [employer]ra Foods seguirn creciendo y Development worker, community.  Las Veterinary surgeon y estar  sensibles al cepillado y al hilo dental.  Pueden aparecer zonas oscuras o manchas (cloasma, mscara del Psychiatrist) en el rostro que probablemente se atenuar despus del nacimiento del beb.  Es posible que se forme una lnea oscura desde el ombligo hasta la zona del pubis (linea nigra) que probablemente se atenuar despus del nacimiento del beb.  Tal vez haya cambios en el cabello que pueden incluir su engrosamiento, crecimiento rpido y cambios en la textura. Adems, a algunas mujeres se les cae el cabello durante o despus del embarazo, o tienen el cabello seco o fino. Lo ms probable es que el cabello se le normalice despus del nacimiento del beb. QU DEBE ESPERAR EN LAS CONSULTAS PRENATALES Durante una visita prenatal de rutina:  La pesarn para asegurarse de que usted y el feto estn creciendo normalmente.  Le tomarn la presin arterial.  Le medirn el abdomen para controlar el desarrollo del beb.  Se escucharn los latidos cardacos fetales.  Se evaluarn los resultados de los estudios solicitados en visitas anteriores. El mdico puede preguntarle lo siguiente:  Cmo se siente.  Si siente los movimientos del beb.  Si ha tenido sntomas anormales, como prdida de lquido, [place], dolores de cabeza intensos o clicos abdominales.  Si est consumiendo algn producto que contenga tabaco, como cigarrillos, tabaco de Theatre manager y Administrator, Civil Service.  Si tiene [employer]. Otros estudios que podrn realizarse durante el segundo trimestre incluyen lo siguiente:  Anlisis de sangre para detectar lo siguiente:  Concentraciones de hierro bajas (anemia).  Diabetes gestacional (entre la semana 24 y  la 28).  Anticuerpos Rh.  Anlisis de orina para detectar infecciones, diabetes o protenas en la orina.  Una ecografa para confirmar que el beb crece y se desarrolla correctamente.  Una amniocentesis para diagnosticar posibles problemas genticos.  Estudios del feto  para descartar espina bfida y sndrome de Down.  Prueba del VIH (virus de inmunodeficiencia humana). Los exmenes prenatales de rutina incluyen la prueba de deteccin del VIH, a menos que decida no Futures trader. INSTRUCCIONES PARA EL CUIDADO EN EL HOGAR   Evite fumar, consumir hierbas, beber alcohol y tomar frmacos que no le hayan recetado. Estas sustancias qumicas afectan la formacin y el desarrollo del beb.  No consuma ningn producto que contenga tabaco, lo que incluye cigarrillos, tabaco de Theatre manager y Administrator, Civil Service. Si necesita ayuda para dejar de fumar, consulte al American Express. Puede recibir asesoramiento y otro tipo de recursos para dejar de fumar.  Siga las indicaciones del mdico en relacin con el uso de medicamentos. Durante el embarazo, hay medicamentos que son seguros de tomar y otros que no.  Haga ejercicio solamente como se lo haya indicado el mdico. Sentir clicos uterinos es un buen signo para Restaurant manager, fast food actividad fsica.  Contine comiendo alimentos sanos con regularidad.  Use un sostn que le brinde buen soporte si le Altria Group.  No se d baos de inmersin en agua caliente, baos turcos ni saunas.  Use el cinturn de seguridad en todo momento mientras conduce.  No coma carne cruda ni queso sin cocinar; evite el contacto con las bandejas sanitarias de los gatos y la tierra que estos animales usan. Estos elementos contienen grmenes que pueden causar defectos congnitos en el beb.  Tome las vitaminas prenatales.  Tome entre 1500 y 2000mg  de calcio diariamente comenzando en la semana20 del embarazo [place].  Si est estreida, pruebe un laxante suave (si el mdico lo autoriza). Consuma ms alimentos ricos en fibra, como vegetales y frutas frescos y Radiation protection practitioner. Beba gran cantidad de lquido para mantener la orina de tono claro o color amarillo plido.  Dese baos de asiento con agua tibia para Engineer, materials o las molestias causadas  por las hemorroides. Use una crema para las hemorroides si el mdico la autoriza.  Si tiene venas varicosas, use medias de descanso. Eleve los pies durante , 3 o 4veces por da. Limite el consumo de sal en su dieta.  No levante objetos pesados, use zapatos de tacones bajos y [address].  Descanse con las piernas elevadas si tiene calambres o dolor de cintura.  Visite a su dentista si an no lo ha Occupational hygienist. Use un cepillo de dientes blando para higienizarse los dientes y psese el hilo dental con suavidad.  Puede seguir [employer], a menos que el mdico le indique lo contrario.  Concurra a todas las visitas prenatales segn las indicaciones de su mdico. SOLICITE ATENCIN MDICA SI:   [place].  Siente clicos leves, presin en la pelvis o dolor persistente en el abdomen.  Tiene nuseas, vmitos o diarrea persistentes.  Brett Fairy secrecin vaginal con mal olor.  Siente dolor al ConocoPhillips. SOLICITE ATENCIN MDICA DE INMEDIATO SI:   Tiene fiebre.  Tiene una prdida de lquido por la vagina.  Tiene sangrado o pequeas prdidas vaginales.  Siente dolor intenso o clicos en el abdomen.  Sube o baja de peso rpidamente.  Tiene dificultad para respirar y siente dolor de pecho.  Sbitamente se le hinchan Toys 'R[employer] Us, las  manos, los tobillos, los pies o las piernas.  No ha sentido los movimientos del beb durante Georgianne Fickuna hora.  Siente un dolor de cabeza intenso que no se alivia con medicamentos.  Su visin se modifica.   Esta informacin no tiene Theme park managercomo fin reemplazar el consejo del mdico. Asegrese de hacerle al mdico cualquier pregunta que tenga.   Document Released: 12/13/2004 Document Revised: 03/26/2014 Elsevier Interactive Patient Education Yahoo[employer] Inc2016 Elsevier Inc.

## 2015-10-27 NOTE — Progress Notes (Addendum)
Aimee Benjamin is a 35 y.o. yo [phone] at 10929w1d by LMP who presents for her initial prenatal visit.  Pregnancy is not planned She reports nausea, however denies any vomiting  She  is taking PNV. See flow sheet for details.  PMH, POBH, FH, meds, allergies and Social Hx reviewed.  Prenatal Exam: Gen: Well nourished, well developed.  No distress.  Vitals noted. HEENT: Normocephalic, atraumatic.  Neck supple without cervical lymphadenopathy, thyromegaly or thyroid nodules.  Fair dentition. CV: RRR no murmur, gallops or rubs Lungs: CTAB.  Normal respiratory effort without wheezes or rales. Abd: soft, NTND. +BS.  Uterus not appreciated above pelvis. Uterus size 12 cm  GU: Normal external female genitalia without lesions.  Deferred further exam due hx of cervical insuffiencey  FHT 156 bpm Ext: No clubbing, cyanosis or edema. Psych: Normal grooming and dress.  Not depressed or anxious appearing.  Normal thought content and process without flight of ideas or looseness of associations.  Assessment & Plan: 1) 35 y.o. yo [id number] at 4329w1d  via LMP doing well. [facility]. Will obtain the results of that ultrasound to determine future dating.  Current pregnancy issues include nausea. Patient declined any treatment for this.  Dating is not reliable. Patient was on birth control and became pregnant. Therefore LMP not accurate. However patient has a dating ultrasound which was previously obtained. Will need this to determine EDD Prenatal labs reviewed, noting notable. Genetic screening offered: Declined, despite counseling. Early glucola is indicated. Elevated at visit. Patient schedule for 3 hour GTT next week Pap smear and G/C probe NOT done at this visit due to concern of previous cervical insuffiencey - Will need at next appointment  PHQ-9 and Pregnancy Medical Home forms completed and reviewed.  Bleeding and pain precautions reviewed. Importance  of prenatal vitamins reviewed.  Cerclage x 2 and cervical insuffiencey with previous preterm deliveries - therefore will refer patient to [facility] Clinic to continue with follow up

## 2015-10-31 ENCOUNTER — Other Ambulatory Visit (INDEPENDENT_AMBULATORY_CARE_PROVIDER_SITE_OTHER): Payer: Self-pay

## 2015-10-31 DIAGNOSIS — Z3482 Encounter for supervision of other normal pregnancy, second trimester: Secondary | ICD-10-CM

## 2015-10-31 LAB — GLUCOSE, CAPILLARY: Glucose-Capillary: 109 mg/dL — ABNORMAL HIGH (ref 65–99)

## 2015-11-01 LAB — GLUCOSE TOLERANCE, 3 HOURS
GLUCOSE, 1 HOUR-GESTATIONAL: 199 mg/dL — AB (ref <190)
GLUCOSE, FASTING-GESTATIONAL: 93 mg/dL (ref 65–104)
Glucose Tolerance, 2 hour: 150 mg/dL (ref <165)
Glucose, GTT - 3 Hour: 103 mg/dL (ref <145)

## 2015-11-28 ENCOUNTER — Ambulatory Visit (INDEPENDENT_AMBULATORY_CARE_PROVIDER_SITE_OTHER): Payer: Self-pay | Admitting: Obstetrics and Gynecology

## 2015-11-28 ENCOUNTER — Encounter: Payer: Self-pay | Admitting: Obstetrics and Gynecology

## 2015-11-28 VITALS — BP 124/75 | HR 97 | Wt 164.6 lb

## 2015-11-28 DIAGNOSIS — O24419 Gestational diabetes mellitus in pregnancy, unspecified control: Secondary | ICD-10-CM

## 2015-11-28 DIAGNOSIS — O99810 Abnormal glucose complicating pregnancy: Secondary | ICD-10-CM

## 2015-11-28 DIAGNOSIS — O09529 Supervision of elderly multigravida, unspecified trimester: Secondary | ICD-10-CM | POA: Insufficient documentation

## 2015-11-28 DIAGNOSIS — O0932 Supervision of pregnancy with insufficient antenatal care, second trimester: Secondary | ICD-10-CM | POA: Insufficient documentation

## 2015-11-28 DIAGNOSIS — O0992 Supervision of high risk pregnancy, unspecified, second trimester: Secondary | ICD-10-CM

## 2015-11-28 DIAGNOSIS — O09522 Supervision of elderly multigravida, second trimester: Secondary | ICD-10-CM

## 2015-11-28 DIAGNOSIS — O09523 Supervision of elderly multigravida, third trimester: Secondary | ICD-10-CM | POA: Insufficient documentation

## 2015-11-28 DIAGNOSIS — Z6832 Body mass index (BMI) 32.0-32.9, adult: Secondary | ICD-10-CM | POA: Insufficient documentation

## 2015-11-28 DIAGNOSIS — Z23 Encounter for immunization: Secondary | ICD-10-CM

## 2015-11-28 DIAGNOSIS — O09292 Supervision of pregnancy with other poor reproductive or obstetric history, second trimester: Secondary | ICD-10-CM

## 2015-11-28 DIAGNOSIS — O09299 Supervision of pregnancy with other poor reproductive or obstetric history, unspecified trimester: Secondary | ICD-10-CM | POA: Insufficient documentation

## 2015-11-28 HISTORY — DX: Gestational diabetes mellitus in pregnancy, unspecified control: O24.419

## 2015-11-28 LAB — COMPREHENSIVE METABOLIC PANEL
ALBUMIN: 3.8 g/dL (ref 3.6–5.1)
ALK PHOS: 48 U/L (ref 33–115)
ALT: 16 U/L (ref 6–29)
AST: 23 U/L (ref 10–30)
BILIRUBIN TOTAL: 0.3 mg/dL (ref 0.2–1.2)
BUN: 5 mg/dL — ABNORMAL LOW (ref 7–25)
CO2: 23 mmol/L (ref 20–31)
Calcium: 9 mg/dL (ref 8.6–10.2)
Chloride: 105 mmol/L (ref 98–110)
Creat: 0.48 mg/dL — ABNORMAL LOW (ref 0.50–1.10)
Glucose, Bld: 91 mg/dL (ref 65–99)
POTASSIUM: 4.1 mmol/L (ref 3.5–5.3)
Sodium: 137 mmol/L (ref 135–146)
Total Protein: 7 g/dL (ref 6.1–8.1)

## 2015-11-28 LAB — POCT URINALYSIS DIP (DEVICE)
Bilirubin Urine: NEGATIVE
GLUCOSE, UA: NEGATIVE mg/dL
HGB URINE DIPSTICK: NEGATIVE
KETONES UR: NEGATIVE mg/dL
Leukocytes, UA: NEGATIVE
Nitrite: NEGATIVE
Protein, ur: NEGATIVE mg/dL
Specific Gravity, Urine: 1.015 (ref 1.005–1.030)
Urobilinogen, UA: 0.2 mg/dL (ref 0.0–1.0)
pH: 6.5 (ref 5.0–8.0)

## 2015-11-28 NOTE — Progress Notes (Signed)
Flu vaccine today 

## 2015-11-28 NOTE — Progress Notes (Signed)
New OB Note  11/28/2015   Clinic: [facility]  Chief Complaint: NOB transfer  Transfer of Care Patient: Yes, Family Medicine  History of Present Illness: Ms. Aimee Benjamin is a 35 y.o. [id number] @ 19/5 weeks (EDC 1/30, based on 8 wk u/s patient she states had during this pregnancy)  Patient's last menstrual period was 07/13/2015 (exact date). Preg complicated by has Supervision of high risk elderly multigravida in second trimester; AMA (advanced maternal age) multigravida 35+; History of cervical incompetence in pregnancy, currently pregnant; Abnormal glucose affecting pregnancy; BMI 32.0-32.9,adult; and Late prenatal care affecting pregnancy in second trimester on her problem list.   No s/s of miscarriage or PTL  ROS: A 12-point review of systems was performed and negative, except as stated in the above HPI.  OBGYN History: As per HPI. OB History  Gravida Para Term Preterm AB Living  8 3 2 1 4 2   SAB TAB Ectopic Multiple Live Births  4     0 3    # Outcome Date GA Lbr Len/2nd Weight Sex Delivery Anes PTL Lv  8 Current           7 Term 08/14/14 [redacted]w[redacted]d 10:54 / 00:53 8 lb 10.6 oz (3.93 kg) F Vag-Spont EPI  LIV     Birth Comments: No problems at birth  86 Term 02/16/13 108w5d 06:36 / 00:37 6 lb 13.6 oz (3.107 kg) M Vag-Spont EPI  LIV  5 SAB 2012 [redacted]w[redacted]d       DEC  4 Preterm 12/2009 [redacted]w[redacted]d    Vag-Spont   DEC  3 SAB 10/2008 [redacted]w[redacted]d       DEC     Birth Comments: Missed AB  2 SAB 09/2008 [redacted]w[redacted]d       DEC  1 SAB 2009 [redacted]w[redacted]d       DEC    Obstetric Comments  Previously with Dr. Gaynell Face. Mother had a cervicalgia for both previous babies.    Patient had cerclage x 2 in the past (prophylactic McDonald x 2)   Past Medical History: Past Medical History:  Diagnosis Date  . Incompetence of cervix   . Preterm labor     Past Surgical History: Past Surgical History:  Procedure Laterality Date  . CERVICAL CERCLAGE N/A 08/27/2012   Procedure: CERCLAGE CERVICAL;  Surgeon: [name], MD;   Location: [facility];  Service: Gynecology;  Laterality: N/A;  . CERVICAL CERCLAGE N/A 02/09/2014   Procedure: CERCLAGE CERVICAL;  Surgeon: [name], MD;  Location: [facility];  Service: Gynecology;  Laterality: N/A;  . CHOLECYSTECTOMY      Family History:  Family History  Problem Relation Age of Onset  . Diabetes Mother    She denies any female cancers, bleeding or blood clotting disorders.  She denies any history of mental retardation, birth defects or genetic disorders in her or the FOB's history  Social History:  Social History   Social History  . Marital status: Single    Spouse name: N/A  . Number of children: N/A  . Years of education: N/A   Occupational History  . Not on file.   Social History Main Topics  . Smoking status: Never Smoker  . Smokeless tobacco: Never Used  . Alcohol use No  . Drug use: No  . Sexual activity: Not Currently    Birth control/ protection: None   Other Topics Concern  . Not on file   Social History Narrative  . No narrative on file   Allergy: Allergies  Allergen Reactions  . Aspirin Other (See Comments)    Anxiety,shortness of breath and foot movement. Can take Ibuprofen per pt  . Penicillins Other (See Comments)    Anxiety,shortness of breath and foot movement    Current Outpatient Medications: PNV  Physical Exam:   BP 124/75   Pulse 97   Wt 164 lb 9.6 oz (74.7 kg)   LMP 07/13/2015 (Exact Date)   BMI 32.15 kg/m  Body mass index is 32.15 kg/m. FHTs: 140s  General appearance: Well nourished, well developed female in no acute distress.   Pelvic exam: is not limited by body habitus EGBUS: within normal limits, Vagina: within normal limits and with no blood in the vault, Cervix: normal appearing cervix without discharge or lesions, closed/long/high, Uterus:  enlarged, c/w 18 week size, and Adnexa:  normal adnexa and no mass, fullness, tenderness  Laboratory: O pos/RI/hepB neg/hiv neg/pap neg 2016/[place] neg/UCx  neg  Imaging:  none  Assessment: Patient stable  Plan: *Pregnancy: routine care. [facility] that [facility] report but nothing is in the system so will try again. ASAP anatomy u/s ordered *AMA: pt amenable to genetic counseling and possible screening *h/o cervical insufficiency and cerclage: d/w pt that given her history that would recommend prophylactic cerclage again, but given her GA, she needs to have an anatomy u/s and see genetics first before going through the potential risks of the surgery. TVUS CL ordered with anatomy scan and will have her fill out 17p application in case unable to place [facility]. Patient states she was on vaginal progesterone prophylactically but I wouldn't recommend that based on the current recommendations, at this point, since there is no e/o cervical shortening.   *Normal 3hr GTT: repeat at 24-28wks  Problem list reviewed and updated.  Follow up in 2 weeks.  >50% of 30 min visit spent on counseling and coordination of care.  Interpreter used   [place] Bingharlie Alf Doyle, Montez HagemanJr. MD Attending Center for [facility] Midwife(Faculty Practice)

## 2015-11-29 LAB — PAIN MGMT, PROFILE 6 CONF W/O MM, U
6 Acetylmorphine: NEGATIVE ng/mL (ref <10)
AMPHETAMINES: NEGATIVE ng/mL (ref <500)
Alcohol Metabolites: NEGATIVE ng/mL (ref <500)
BARBITURATES: NEGATIVE ng/mL (ref <300)
BENZODIAZEPINES: NEGATIVE ng/mL (ref <100)
CREATININE: 30.4 mg/dL (ref >=20.0)
Cocaine Metabolite: NEGATIVE ng/mL (ref <150)
METHADONE METABOLITE: NEGATIVE ng/mL (ref <100)
Marijuana Metabolite: NEGATIVE ng/mL (ref <20)
Opiates: NEGATIVE ng/mL (ref <100)
Oxidant: NEGATIVE ug/mL (ref <200)
Oxycodone: NEGATIVE ng/mL (ref <100)
PH: 6.93 (ref 4.5–9.0)
Phencyclidine: NEGATIVE ng/mL (ref <25)
Please note:: 0

## 2015-11-29 LAB — PROTEIN / CREATININE RATIO, URINE
Creatinine, Urine: 34 mg/dL (ref 20–320)
PROTEIN CREATININE RATIO: 118 mg/g{creat} (ref 21–161)
TOTAL PROTEIN, URINE: 4 mg/dL — AB (ref 5–24)

## 2015-11-29 LAB — GC/CHLAMYDIA PROBE AMP (~~LOC~~) NOT AT ARMC
Chlamydia: NEGATIVE
Neisseria Gonorrhea: NEGATIVE

## 2015-12-01 ENCOUNTER — Encounter (HOSPITAL_COMMUNITY): Payer: Self-pay | Admitting: Advanced Practice Midwife

## 2015-12-02 LAB — CYSTIC FIBROSIS DIAGNOSTIC STUDY

## 2015-12-09 ENCOUNTER — Encounter (HOSPITAL_COMMUNITY): Payer: Self-pay

## 2015-12-09 ENCOUNTER — Other Ambulatory Visit (HOSPITAL_COMMUNITY): Payer: Self-pay | Admitting: *Deleted

## 2015-12-09 ENCOUNTER — Encounter: Payer: Self-pay | Admitting: Obstetrics and Gynecology

## 2015-12-09 ENCOUNTER — Ambulatory Visit (HOSPITAL_COMMUNITY)
Admission: RE | Admit: 2015-12-09 | Discharge: 2015-12-09 | Disposition: A | Discharge status: Home / Self Care | Payer: Self-pay | Source: Ambulatory Visit | Attending: Obstetrics and Gynecology | Admitting: Obstetrics and Gynecology

## 2015-12-09 ENCOUNTER — Other Ambulatory Visit: Payer: Self-pay | Admitting: Obstetrics and Gynecology

## 2015-12-09 ENCOUNTER — Ambulatory Visit (HOSPITAL_COMMUNITY): Payer: Self-pay

## 2015-12-09 DIAGNOSIS — O09219 Supervision of pregnancy with history of pre-term labor, unspecified trimester: Secondary | ICD-10-CM

## 2015-12-09 DIAGNOSIS — O09292 Supervision of pregnancy with other poor reproductive or obstetric history, second trimester: Secondary | ICD-10-CM

## 2015-12-09 DIAGNOSIS — O09522 Supervision of elderly multigravida, second trimester: Secondary | ICD-10-CM | POA: Insufficient documentation

## 2015-12-09 DIAGNOSIS — Z3A19 19 weeks gestation of pregnancy: Secondary | ICD-10-CM | POA: Insufficient documentation

## 2015-12-09 DIAGNOSIS — Z8751 Personal history of pre-term labor: Secondary | ICD-10-CM | POA: Insufficient documentation

## 2015-12-09 NOTE — Progress Notes (Signed)
EDC changed to 2/16 (19/0) based on u/s today by MFM. Request sent to clinic to have patient come in any time next week to start 17p.   Aimee Benjamin, Jr MD Attending Center for [facility] Midwife(Faculty Practice)

## 2015-12-09 NOTE — Progress Notes (Signed)
Genetic Counseling  High-Risk Gestation Note  Appointment Date:  12/09/2015 Referred By: [place] BingPickens, [name], MD Date of Birth:  10/31/1980    Pregnancy History: [id number] Estimated Date of Delivery: 05/04/16 Estimated Gestational Age:3964w0d Attending: Charlsie MerlesMark Newman, MD   Aimee. Tanja PortSilvia Benjamin was seen for genetic counseling because of a maternal age of 35 y.o..   [facility] Spanish/English interpreter, Raquel, provided interpretation.   In summary:  Discussed AMA and associated risk for fetal aneuploidy  Discussed options for screening  Quad screen- declined  NIPS-declined  Ultrasound-performed today, within normal range  Discussed diagnostic testing options  Amniocentesis-declined  Reviewed family history concerns  She was counseled regarding maternal age and the association with risk for chromosome conditions due to nondisjunction with aging of the ova.   We reviewed chromosomes, nondisjunction, and the associated 1 in 111 risk for fetal aneuploidy related to a maternal age of 35 y.o. at 5864w0d gestation.  She was counseled that the risk for aneuploidy decreases as gestational age increases, accounting for those pregnancies which spontaneously abort.  We specifically discussed Down syndrome (trisomy 7721), trisomies 113 and 6018, and sex chromosome aneuploidies (47,XXX and 47,XXY) including the common features and prognoses of each.   We reviewed available screening options including Quad screen, noninvasive prenatal screening (NIPS)/cell free DNA (cfDNA) screening, and detailed ultrasound.  She was counseled that screening tests are used to modify a patient's a priori risk for aneuploidy, typically based on age. This estimate provides a pregnancy specific risk assessment. We reviewed the benefits and limitations of each option. Specifically, we discussed the conditions for which each test screens, the detection rates, and false positive rates of each. She was also counseled regarding  diagnostic testing via amniocentesis. We reviewed the approximate 1 in 300-500 risk for complications from amniocentesis, including spontaneous pregnancy loss. We discussed the possible results that the tests might provide including: positive, negative, unanticipated, and no result. Finally, she was counseled regarding the cost of each option and potential out of pocket expenses. After consideration of all the options, she elected to proceed with ultrasound only and declined all additional genetic screening or testing in pregnancy (including NIPS and amniocentesis).   The patient also expressed interest in having a detailed ultrasound.  A complete ultrasound was performed today. The ultrasound report will be sent under separate cover. There were no visualized fetal anomalies or markers suggestive of aneuploidy. Diagnostic testing was declined today.  She understands that screening tests cannot rule out all birth defects or genetic syndromes. The patient was advised of this limitation and states she still does not want additional testing at this time.   Both family histories were reviewed and found to be noncontributory for birth defects, intellectual disability, and known genetic conditions. Without further information regarding the provided family history, an accurate genetic risk cannot be calculated. Further genetic counseling is warranted if more information is obtained.  Aimee Benjamin denied exposure to environmental toxins or chemical agents. She denied the use of alcohol, tobacco or street drugs. She denied significant viral illnesses during the course of her pregnancy. Her medical and surgical histories were contributory for previous preterm delivery due to cervical insufficiency.   I counseled Aimee. Genella RifeSilvia Benjamin regarding the above risks and available options.  The approximate face-to-face time with the genetic counselor was 40 minutes.  Quinn PlowmanKaren Martie Muhlbauer, Aimee,  Certified Advanced Micro Devicesenetic  Counselor 12/09/2015

## 2015-12-13 ENCOUNTER — Ambulatory Visit (INDEPENDENT_AMBULATORY_CARE_PROVIDER_SITE_OTHER): Payer: Self-pay | Admitting: Advanced Practice Midwife

## 2015-12-13 DIAGNOSIS — O09292 Supervision of pregnancy with other poor reproductive or obstetric history, second trimester: Secondary | ICD-10-CM

## 2015-12-13 DIAGNOSIS — O09522 Supervision of elderly multigravida, second trimester: Secondary | ICD-10-CM

## 2015-12-13 MED ORDER — HYDROXYPROGESTERONE CAPROATE 250 MG/ML IM OIL
250.0000 mg | TOPICAL_OIL | INTRAMUSCULAR | Status: AC
  Administered 2015-12-13 – 2016-04-09 (×16): 250 mg via INTRAMUSCULAR

## 2015-12-13 NOTE — Progress Notes (Signed)
Used Video interpreter Riley LamEunice [phone].

## 2015-12-13 NOTE — Patient Instructions (Signed)

## 2015-12-16 ENCOUNTER — Ambulatory Visit (HOSPITAL_COMMUNITY)
Admission: RE | Admit: 2015-12-16 | Discharge: 2015-12-16 | Disposition: A | Discharge status: Home / Self Care | Payer: Self-pay | Source: Ambulatory Visit | Attending: Obstetrics and Gynecology | Admitting: Obstetrics and Gynecology

## 2015-12-16 ENCOUNTER — Ambulatory Visit (HOSPITAL_COMMUNITY): Payer: Self-pay

## 2015-12-16 ENCOUNTER — Encounter (HOSPITAL_COMMUNITY): Payer: Self-pay

## 2015-12-16 DIAGNOSIS — O09522 Supervision of elderly multigravida, second trimester: Secondary | ICD-10-CM | POA: Insufficient documentation

## 2015-12-16 DIAGNOSIS — O09212 Supervision of pregnancy with history of pre-term labor, second trimester: Secondary | ICD-10-CM | POA: Insufficient documentation

## 2015-12-16 DIAGNOSIS — O09219 Supervision of pregnancy with history of pre-term labor, unspecified trimester: Secondary | ICD-10-CM

## 2015-12-16 DIAGNOSIS — Z3A20 20 weeks gestation of pregnancy: Secondary | ICD-10-CM | POA: Insufficient documentation

## 2015-12-19 NOTE — Progress Notes (Signed)
   PRENATAL VISIT NOTE  Subjective:  Aimee Benjamin is a 35 y.o. [id number] at 3475w4d being seen today for ongoing prenatal care.  She is currently monitored for the following issues for this high-risk pregnancy and has Supervision of high risk elderly multigravida in second trimester; AMA (advanced maternal age) multigravida 35+; History of cervical incompetence in pregnancy, currently pregnant; Abnormal glucose affecting pregnancy; BMI 32.0-32.9,adult; Late prenatal care affecting pregnancy in second trimester; and [redacted] weeks gestation of pregnancy on her problem list.  Patient reports no complaints.  Contractions: Not present. Vag. Bleeding: None.  Movement: Present. Denies leaking of fluid.   The following portions of the patient's history were reviewed and updated as appropriate: allergies, current medications, past family history, past medical history, past social history, past surgical history and problem list. Problem list updated.  Objective:   Vitals:   12/13/15 0939  BP: 123/67  Pulse: 91  Weight: 168 lb 6.4 oz (76.4 kg)    Fetal Status: Fetal Heart Rate (bpm): 144 Fundal Height: 20 cm Movement: Present     General:  Alert, oriented and cooperative. Patient is in no acute distress.  Skin: Skin is warm and dry. No rash noted.   Cardiovascular: Normal heart rate noted  Respiratory: Normal respiratory effort, no problems with respiration noted  Abdomen: Soft, gravid, appropriate for gestational age. Pain/Pressure: Absent     Pelvic:  Cervical exam deferred        Extremities: Normal range of motion.  Edema: None  Mental Status: Normal mood and affect. Normal behavior. Normal judgment and thought content.   Urinalysis:     CL 4.8 cm at 19 weeks  Assessment and Plan:  Pregnancy: [id number] at 5478w3d  1. Supervision of high risk elderly multigravida in second trimester  - hydroxyprogesterone caproate (MAKENA) 250 mg/mL injection 250 mg; Inject 1 mL (250 mg total) into the muscle  once a week.  2. AMA (advanced maternal age) multigravida 35+, second trimester   3. History of cervical incompetence in pregnancy, currently pregnant, second trimester  - hydroxyprogesterone caproate (MAKENA) 250 mg/mL injection 250 mg; Inject 1 mL (250 mg total) into the muscle once a week.  Preterm labor symptoms and general obstetric precautions including but not limited to vaginal bleeding, contractions, leaking of fluid and fetal movement were reviewed in detail with the patient. Please refer to After Visit Summary for other counseling recommendations.  Return in about 4 weeks (around 01/10/2016) for Weekly 17-P. ROB in 4 weeks.Dorathy Kinsman.  [place], CNM

## 2015-12-20 ENCOUNTER — Other Ambulatory Visit (HOSPITAL_COMMUNITY): Payer: Self-pay | Admitting: *Deleted

## 2015-12-20 DIAGNOSIS — O09219 Supervision of pregnancy with history of pre-term labor, unspecified trimester: Secondary | ICD-10-CM

## 2015-12-20 DIAGNOSIS — O09529 Supervision of elderly multigravida, unspecified trimester: Secondary | ICD-10-CM

## 2015-12-21 ENCOUNTER — Ambulatory Visit: Payer: Self-pay

## 2015-12-21 VITALS — BP 101/82 | HR 94

## 2015-12-21 DIAGNOSIS — O09522 Supervision of elderly multigravida, second trimester: Secondary | ICD-10-CM

## 2015-12-21 NOTE — Progress Notes (Signed)
Patient presented to the office today for her 17-p injection. Patient tolerated well and will follow up next week for her next injection.

## 2015-12-28 ENCOUNTER — Ambulatory Visit (INDEPENDENT_AMBULATORY_CARE_PROVIDER_SITE_OTHER): Payer: Self-pay | Admitting: *Deleted

## 2015-12-28 DIAGNOSIS — O09522 Supervision of elderly multigravida, second trimester: Secondary | ICD-10-CM

## 2015-12-28 DIAGNOSIS — O09292 Supervision of pregnancy with other poor reproductive or obstetric history, second trimester: Secondary | ICD-10-CM

## 2015-12-30 ENCOUNTER — Ambulatory Visit (HOSPITAL_COMMUNITY)
Admission: RE | Admit: 2015-12-30 | Discharge: 2015-12-30 | Disposition: A | Discharge status: Home / Self Care | Payer: Self-pay | Source: Ambulatory Visit | Attending: Obstetrics and Gynecology | Admitting: Obstetrics and Gynecology

## 2015-12-30 ENCOUNTER — Encounter (HOSPITAL_COMMUNITY): Payer: Self-pay

## 2015-12-30 DIAGNOSIS — Z3A22 22 weeks gestation of pregnancy: Secondary | ICD-10-CM | POA: Insufficient documentation

## 2015-12-30 DIAGNOSIS — O09212 Supervision of pregnancy with history of pre-term labor, second trimester: Secondary | ICD-10-CM | POA: Insufficient documentation

## 2015-12-30 DIAGNOSIS — O09522 Supervision of elderly multigravida, second trimester: Secondary | ICD-10-CM | POA: Insufficient documentation

## 2015-12-30 DIAGNOSIS — O09219 Supervision of pregnancy with history of pre-term labor, unspecified trimester: Secondary | ICD-10-CM

## 2015-12-30 NOTE — ED Notes (Signed)
[facility] [phone]#[id number], Nancy MarusRaphael used to translate for RN.

## 2016-01-04 ENCOUNTER — Ambulatory Visit (INDEPENDENT_AMBULATORY_CARE_PROVIDER_SITE_OTHER): Payer: Self-pay | Admitting: *Deleted

## 2016-01-04 VITALS — BP 121/58 | HR 92 | Wt 170.4 lb

## 2016-01-04 DIAGNOSIS — O09292 Supervision of pregnancy with other poor reproductive or obstetric history, second trimester: Secondary | ICD-10-CM

## 2016-01-12 ENCOUNTER — Ambulatory Visit (INDEPENDENT_AMBULATORY_CARE_PROVIDER_SITE_OTHER): Payer: Self-pay | Admitting: Obstetrics and Gynecology

## 2016-01-12 DIAGNOSIS — O09522 Supervision of elderly multigravida, second trimester: Secondary | ICD-10-CM

## 2016-01-12 LAB — POCT URINALYSIS DIP (DEVICE)
BILIRUBIN URINE: NEGATIVE
Glucose, UA: 100 mg/dL — AB
HGB URINE DIPSTICK: NEGATIVE
KETONES UR: NEGATIVE mg/dL
Leukocytes, UA: NEGATIVE
Nitrite: NEGATIVE
PH: 7 (ref 5.0–8.0)
Protein, ur: NEGATIVE mg/dL
SPECIFIC GRAVITY, URINE: 1.015 (ref 1.005–1.030)
Urobilinogen, UA: 0.2 mg/dL (ref 0.0–1.0)

## 2016-01-12 NOTE — Patient Instructions (Signed)
Etonogestrel implant Qu es este medicamento? El ETONOGESTREL es un dispositivo anticonceptivo (control de la natalidad). Se utiliza para Patent attorney. Se puede utilizar hasta 3 aos. Este medicamento puede ser utilizado para otros usos; si tiene alguna pregunta consulte con su proveedor de atencin mdica o con su farmacutico. Qu le debo informar a mi profesional de la salud antes de tomar este medicamento? Necesita saber si usted presenta alguno de los siguientes problemas o situaciones: sangrado vaginal anormal enfermedad vascular o cogulos sanguneos cncer de mama, cervical, heptico depresin diabetes enfermedad de la vescula biliar dolores de cabeza enfermedad cardiaca o ataque cardiaco reciente alta presin sangunea alto nivel de colesterol enfermedad renal enfermedad heptica convulsiones fuma tabaco una reaccin alrgica o inusual al etonogestrel, otras hormonas, anestsicos o antispticos, medicamentos, alimentos, colorantes o conservantes si est embarazada o buscando quedar embarazada si est amamantando a un beb Cmo debo [employer]? Este dispositivo se inserta debajo de la piel en la cara interna de la parte superior del brazo por un profesional de Technical sales engineer. Hable con su pediatra para informarse acerca del uso de este medicamento en nios. Puede requerir atencin especial. Sobredosis: Pngase en contacto inmediatamente con un centro toxicolgico o una sala de urgencia si usted cree que haya tomado demasiado medicamento. ATENCIN: [employer] es solo para usted. No comparta este medicamento con nadie. Qu sucede si me olvido de una dosis? No se aplica en este caso. Qu puede interactuar con este medicamento? No tome esta medicina con ninguno de los siguientes medicamentos: amprenavir bosentano fosamprenavir Esta medicina tambin puede interactuar con los siguientes medicamentos: medicamentos barbitricos para inducir el sueo o tratar  convulsiones ciertos medicamentos para las infecciones micticas tales como quetoconazol e itraconazol griseofulvina medicamentos para tratar convulsiones, tales como carbamazepina, felbamato, Radio producer, fenitona, topiramato modafinil fenilbutazona rifampicina algunos medicamentos para tratar la infeccin por VIH tales como atazanavir, indinavir, lopinavir, nelfinavir, tipranavir, ritonavir hierba de [place] Puede ser que esta lista no menciona todas las posibles interacciones. Informe a su profesional de [employer]	[place] de [employer] productos a base de hierbas, medicamentos de [employer] o suplementos nutritivos que est tomando. Si usted fuma, consume bebidas alcohlicas o si utiliza drogas ilegales, indqueselo tambin a su profesional de [employer]	[place]. Algunas sustancias pueden interactuar con su medicamento. A qu debo estar atento al usar [employer]? Este producto no protege contra la infeccin por el VIH ([place]) u otras enfermedades de transmisin sexual. Usted debe sentir el implante al presionar con las yemas de los dedos sobre la piel donde se insert. Contacte a su mdico si no se siente el implante y [place] un mtodo anticonceptivo no hormonal (como el condn) hasta que el mdico confirma que el implante est en su Environmental consultant. Si siente que el implante puede haber roto o doblado en su brazo, pngase en contacto con su proveedor de atencin mdica. Qu efectos secundarios puedo tener al [employer] este medicamento? Efectos secundarios que debe informar a su mdico o a Barrister's clerk de la salud tan pronto como sea posible: Chief of Staff como erupcin cutnea, picazn o urticarias, hinchazn de la cara, labios o lengua ndulos mamarios cambios de emociones o humor humor deprimido sangrado menstrual prolongado o abundante dolor, irritacin, hichazn o Ship broker de la insercin Investment banker, corporate de la insercin signos de infeccin en el lugar de la insercin, tales como fiebre y  Agricultural engineer, Social research officer, government o descarga de la piel signos de Media planner signos y sntomas de un cogulo sanguneo tales  como problemas respiratorios; cambios en la visin; dolor en el pecho; dolor de cabeza severo, repentino; dolor, hinchazn, clida en la pierna; dificultad para hablar; entumecimiento o debilidad repentina de la cara, brazo o pierna signos y sntomas de lesin al hgado como orina amarillo oscuro o marrn; sensacin general de estar enfermo o sntomas gripales; heces claras; prdida de apetito; nuseas; dolor en la regin abdominal superior derecha; cansancio o debilidad inusual; color amarillento de los ojos o la piel sangrado, flujo vaginal inusual signos y sntomas de un derrame cerebral tales como cambios en la visin; confusin; dificultad para hablar o entender; dolores de cabeza severos; entumecimiento o debilidad repentina de la cara, brazo o pierna; dificultad para andar; mareos; prdida del equilibrio o coordinacin Efectos secundarios que, por lo general, no requieren atencin mdica (debe informarlos a su mdico o a su profesional de la salud si persisten o si son molestos): acn dolor de espalda dolor de pecho cambios de peso mareos sensacin general de estar enfermo o sntomas gripales dolor de cabeza sangrado menstrual irregular nuseas dolor de garganta irritacin o inflamacin vaginal Puede ser que esta lista no menciona todos los posibles efectos secundarios. Comunquese a su mdico por asesoramiento mdico sobre los efectos secundarios. Usted puede informar los efectos secundarios a la FDA por telfono al 1-800-FDA-1088. Dnde debo guardar mi medicina? Este medicamento se administra en hospitales o clnicas y no necesitar guardarlo en su domicilio. ATENCIN: Este folleto es un resumen. Puede ser que no cubra toda la posible informacin. Si usted tiene preguntas acerca de esta medicina, consulte con su mdico, su farmacutico o su profesional de la salud.    2016, Elsevier/Gold  Standard. (2014-04-28 00:00:00)  

## 2016-01-12 NOTE — Progress Notes (Signed)
   PRENATAL VISIT NOTE  Subjective:  Aimee Benjamin is a 35 y.o. [phone] at 7278w6d being seen today for ongoing prenatal care.  She is currently monitored for the following issues for this high-risk pregnancy and has Supervision of high risk elderly multigravida in second trimester; AMA (advanced maternal age) multigravida 35+; History of cervical incompetence in pregnancy, currently pregnant; Abnormal glucose affecting pregnancy; BMI 32.0-32.9,adult; Late prenatal care affecting pregnancy in second trimester; and [redacted] weeks gestation of pregnancy on her problem list.  Patient reports no complaints.  Contractions: Not present. Vag. Bleeding: None.  Movement: Present. Denies leaking of fluid.   The following portions of the patient's history were reviewed and updated as appropriate: allergies, current medications, past family history, past medical history, past social history, past surgical history and problem list. Problem list updated.  Objective:   Vitals:   01/12/16 1312  BP: (!) 112/59  Pulse: (!) 106  Weight: 171 lb 1.6 oz (77.6 kg)    Fetal Status: Fetal Heart Rate (bpm): 146   Movement: Present     General:  Alert, oriented and cooperative. Patient is in no acute distress.  Skin: Skin is warm and dry. No rash noted.   Cardiovascular: Normal heart rate noted  Respiratory: Normal respiratory effort, no problems with respiration noted  Abdomen: Soft, gravid, appropriate for gestational age. Pain/Pressure: Absent     Pelvic:  Cervical exam deferred        Extremities: Normal range of motion.  Edema: None  Mental Status: Normal mood and affect. Normal behavior. Normal judgment and thought content.   Assessment and Plan:  Pregnancy: [id number] at 7978w6d  1. Supervision of high risk elderly multigravida in second trimester -Pt will need 3 hour Gtt at next visit.  -28wk labs at next visit and TDAP  2. History of pre-term delivery -continue 17-P -[place] scheduled tomorrow for growth and  repeat cervical length.   Preterm labor symptoms and general obstetric precautions including but not limited to vaginal bleeding, contractions, leaking of fluid and fetal movement were reviewed in detail with the patient. Please refer to After Visit Summary for other counseling recommendations.  Return in about 4 weeks (around 02/09/2016) for weekly 17P, HROB, and 3 hour gtt.  Lorne SkeensNicholas Michael Adyan Palau, MD

## 2016-01-12 NOTE — Progress Notes (Signed)
Used Video interpreter

## 2016-01-13 ENCOUNTER — Ambulatory Visit (HOSPITAL_COMMUNITY)
Admission: RE | Admit: 2016-01-13 | Discharge: 2016-01-13 | Disposition: A | Discharge status: Home / Self Care | Payer: Self-pay | Source: Ambulatory Visit | Attending: Obstetrics and Gynecology | Admitting: Obstetrics and Gynecology

## 2016-01-13 ENCOUNTER — Other Ambulatory Visit (HOSPITAL_COMMUNITY): Payer: Self-pay | Admitting: Maternal and Fetal Medicine

## 2016-01-13 ENCOUNTER — Encounter (HOSPITAL_COMMUNITY): Payer: Self-pay

## 2016-01-13 DIAGNOSIS — O09212 Supervision of pregnancy with history of pre-term labor, second trimester: Secondary | ICD-10-CM

## 2016-01-13 DIAGNOSIS — O09529 Supervision of elderly multigravida, unspecified trimester: Secondary | ICD-10-CM

## 2016-01-13 DIAGNOSIS — O09522 Supervision of elderly multigravida, second trimester: Secondary | ICD-10-CM | POA: Insufficient documentation

## 2016-01-13 DIAGNOSIS — Z3A24 24 weeks gestation of pregnancy: Secondary | ICD-10-CM | POA: Insufficient documentation

## 2016-01-16 ENCOUNTER — Other Ambulatory Visit (HOSPITAL_COMMUNITY): Payer: Self-pay | Admitting: Maternal and Fetal Medicine

## 2016-01-16 DIAGNOSIS — O09523 Supervision of elderly multigravida, third trimester: Secondary | ICD-10-CM

## 2016-01-16 DIAGNOSIS — O09293 Supervision of pregnancy with other poor reproductive or obstetric history, third trimester: Secondary | ICD-10-CM

## 2016-01-16 DIAGNOSIS — O359XX0 Maternal care for (suspected) fetal abnormality and damage, unspecified, not applicable or unspecified: Secondary | ICD-10-CM

## 2016-01-16 DIAGNOSIS — Z3A30 30 weeks gestation of pregnancy: Secondary | ICD-10-CM

## 2016-01-19 ENCOUNTER — Ambulatory Visit (INDEPENDENT_AMBULATORY_CARE_PROVIDER_SITE_OTHER): Payer: Self-pay

## 2016-01-19 VITALS — BP 102/75 | HR 75

## 2016-01-19 DIAGNOSIS — O0992 Supervision of high risk pregnancy, unspecified, second trimester: Secondary | ICD-10-CM

## 2016-01-19 DIAGNOSIS — O09212 Supervision of pregnancy with history of pre-term labor, second trimester: Secondary | ICD-10-CM

## 2016-01-19 MED ORDER — HYDROXYPROGESTERONE CAPROATE 250 MG/ML IM OIL: 250.0000 mg | TOPICAL_OIL | Freq: Once | INTRAMUSCULAR | Status: DC

## 2016-01-19 NOTE — Progress Notes (Signed)
Patient presented to office today for her 4917- Injection. Patient tolerated well and will follow up on 01/26/2016 for next injection.

## 2016-01-26 ENCOUNTER — Encounter: Payer: Self-pay | Admitting: *Deleted

## 2016-01-26 ENCOUNTER — Ambulatory Visit: Payer: Self-pay | Admitting: *Deleted

## 2016-01-26 VITALS — BP 120/72

## 2016-01-26 DIAGNOSIS — O09892 Supervision of other high risk pregnancies, second trimester: Secondary | ICD-10-CM

## 2016-01-26 DIAGNOSIS — O09212 Supervision of pregnancy with history of pre-term labor, second trimester: Principal | ICD-10-CM

## 2016-02-02 ENCOUNTER — Encounter: Payer: Self-pay | Admitting: Internal Medicine

## 2016-02-02 ENCOUNTER — Ambulatory Visit (INDEPENDENT_AMBULATORY_CARE_PROVIDER_SITE_OTHER): Payer: Self-pay | Admitting: *Deleted

## 2016-02-02 VITALS — BP 117/61 | HR 105

## 2016-02-02 DIAGNOSIS — O09892 Supervision of other high risk pregnancies, second trimester: Secondary | ICD-10-CM

## 2016-02-02 DIAGNOSIS — O09212 Supervision of pregnancy with history of pre-term labor, second trimester: Secondary | ICD-10-CM

## 2016-02-08 ENCOUNTER — Ambulatory Visit (INDEPENDENT_AMBULATORY_CARE_PROVIDER_SITE_OTHER): Payer: Self-pay | Admitting: *Deleted

## 2016-02-08 DIAGNOSIS — O09892 Supervision of other high risk pregnancies, second trimester: Secondary | ICD-10-CM

## 2016-02-08 DIAGNOSIS — O09212 Supervision of pregnancy with history of pre-term labor, second trimester: Secondary | ICD-10-CM

## 2016-02-13 ENCOUNTER — Ambulatory Visit (INDEPENDENT_AMBULATORY_CARE_PROVIDER_SITE_OTHER): Payer: Self-pay | Admitting: Obstetrics & Gynecology

## 2016-02-13 ENCOUNTER — Encounter: Payer: Self-pay | Admitting: Obstetrics & Gynecology

## 2016-02-13 VITALS — BP 114/60 | HR 93 | Wt 178.0 lb

## 2016-02-13 DIAGNOSIS — O0932 Supervision of pregnancy with insufficient antenatal care, second trimester: Secondary | ICD-10-CM

## 2016-02-13 DIAGNOSIS — Z23 Encounter for immunization: Secondary | ICD-10-CM

## 2016-02-13 DIAGNOSIS — O09523 Supervision of elderly multigravida, third trimester: Secondary | ICD-10-CM

## 2016-02-13 DIAGNOSIS — O09522 Supervision of elderly multigravida, second trimester: Secondary | ICD-10-CM

## 2016-02-13 DIAGNOSIS — O09213 Supervision of pregnancy with history of pre-term labor, third trimester: Secondary | ICD-10-CM

## 2016-02-13 DIAGNOSIS — O09299 Supervision of pregnancy with other poor reproductive or obstetric history, unspecified trimester: Secondary | ICD-10-CM

## 2016-02-13 DIAGNOSIS — O0933 Supervision of pregnancy with insufficient antenatal care, third trimester: Secondary | ICD-10-CM

## 2016-02-13 DIAGNOSIS — O358XX0 Maternal care for other (suspected) fetal abnormality and damage, not applicable or unspecified: Secondary | ICD-10-CM

## 2016-02-13 DIAGNOSIS — O35EXX0 Maternal care for other (suspected) fetal abnormality and damage, fetal genitourinary anomalies, not applicable or unspecified: Secondary | ICD-10-CM

## 2016-02-13 DIAGNOSIS — O09293 Supervision of pregnancy with other poor reproductive or obstetric history, third trimester: Secondary | ICD-10-CM

## 2016-02-13 LAB — CBC
HCT: 33.7 % — ABNORMAL LOW (ref 35.0–45.0)
HEMOGLOBIN: 10.8 g/dL — AB (ref 11.7–15.5)
MCH: 27.4 pg (ref 27.0–33.0)
MCHC: 32 g/dL (ref 32.0–36.0)
MCV: 85.5 fL (ref 80.0–100.0)
MPV: 11.1 fL (ref 7.5–12.5)
Platelets: 197 10*3/uL (ref 140–400)
RBC: 3.94 MIL/uL (ref 3.80–5.10)
RDW: 14.5 % (ref 11.0–15.0)
WBC: 9.9 10*3/uL (ref 3.8–10.8)

## 2016-02-13 NOTE — Progress Notes (Signed)
Patient here for 3 hr gtt today; also needs 28 week labs--RN Lyla Sonarrie    PRENATAL VISIT NOTE  Subjective:  Aimee Benjamin is a 35 y.o. [id number] at [redacted]w[redacted]d being seen today for ongoing prenatal care.  She is currently monitored for the following issues for this high-risk pregnancy and has Supervision of high risk elderly multigravida in second trimester; AMA (advanced maternal age) multigravida 35+; History of cervical incompetence in pregnancy, currently pregnant; Abnormal glucose affecting pregnancy; BMI 32.0-32.9,adult; Late prenatal care affecting pregnancy in second trimester; and Bilateral fetal pyelectasis on her problem list.  Patient reports no complaints.  Contractions: Not present. Vag. Bleeding: None.  Movement: Present. Denies leaking of fluid.   The following portions of the patient's history were reviewed and updated as appropriate: allergies, current medications, past family history, past medical history, past social history, past surgical history and problem list. Problem list updated.  Objective:   Vitals:   02/13/16 0811  BP: 114/60  Pulse: 93  Weight: 178 lb (80.7 kg)    Fetal Status: Fetal Heart Rate (bpm): 133 Fundal Height: 36 cm Movement: Present     General:  Alert, oriented and cooperative. Patient is in no acute distress.  Skin: Skin is warm and dry. No rash noted.   Cardiovascular: Normal heart rate noted  Respiratory: Normal respiratory effort, no problems with respiration noted  Abdomen: Soft, gravid, appropriate for gestational age. Pain/Pressure: Absent     Pelvic:  Cervical exam deferred        Extremities: Normal range of motion.  Edema: Trace  Mental Status: Normal mood and affect. Normal behavior. Normal judgment and thought content.   Assessment and Plan:  Pregnancy: [id number] at 7432w3d  1. Elderly multigravida in third trimester - Glucose tolerance, 3 hours - CBC - RPR - HIV antibody (with reflex) - Tdap vaccine greater than or equal to 7yo  IM  2. History of cervical incompetence in pregnancy, currently pregnant - No complaints of discharge, pressure, or contractions - Continue 17P weekly until 36 weeks  3. Encounter for repeat ultrasound of fetal pyelectasis, antepartum, single or unspecified fetus - MFM US on 02/24/16--Will also get growth.  Given materal obesity and the way patient is carrying the baby, fundal height is large but doubt fetal macrosomia at this point.  Last baby was almost 9#.  Preterm labor symptoms and general obstetric precautions including but not limited to vaginal bleeding, contractions, leaking of fluid and fetal movement were reviewed in detail with the patient. Please refer to After Visit Summary for other counseling recommendations.  Return in 2 weeks (on 02/27/2016).   Lesly DukesKelly H Shalah Estelle, MD

## 2016-02-14 LAB — HIV ANTIBODY (ROUTINE TESTING W REFLEX): HIV 1&2 Ab, 4th Generation: NONREACTIVE

## 2016-02-14 LAB — RPR

## 2016-02-15 LAB — GLUCOSE TOLERANCE, 3 HOURS
GLUCOSE 3 HOUR GTT: 158 mg/dL — AB (ref <145)
Glucose Tolerance, 1 hour: 255 mg/dL — ABNORMAL HIGH (ref <190)
Glucose Tolerance, 2 hour: 230 mg/dL — ABNORMAL HIGH (ref <165)
Glucose Tolerance, Fasting: 115 mg/dL — ABNORMAL HIGH (ref 65–104)

## 2016-02-20 ENCOUNTER — Ambulatory Visit (INDEPENDENT_AMBULATORY_CARE_PROVIDER_SITE_OTHER): Payer: Self-pay | Admitting: General Practice

## 2016-02-20 VITALS — BP 117/62 | HR 83 | Ht 60.0 in | Wt 180.0 lb

## 2016-02-20 DIAGNOSIS — O09299 Supervision of pregnancy with other poor reproductive or obstetric history, unspecified trimester: Secondary | ICD-10-CM

## 2016-02-20 DIAGNOSIS — O09293 Supervision of pregnancy with other poor reproductive or obstetric history, third trimester: Secondary | ICD-10-CM

## 2016-02-24 ENCOUNTER — Ambulatory Visit (HOSPITAL_COMMUNITY)
Admission: RE | Admit: 2016-02-24 | Discharge: 2016-02-24 | Disposition: A | Discharge status: Home / Self Care | Payer: Self-pay | Source: Ambulatory Visit | Attending: Obstetrics and Gynecology | Admitting: Obstetrics and Gynecology

## 2016-02-24 ENCOUNTER — Encounter (HOSPITAL_COMMUNITY): Payer: Self-pay

## 2016-02-24 DIAGNOSIS — O09523 Supervision of elderly multigravida, third trimester: Secondary | ICD-10-CM | POA: Insufficient documentation

## 2016-02-24 DIAGNOSIS — O3693X0 Maternal care for fetal problem, unspecified, third trimester, not applicable or unspecified: Secondary | ICD-10-CM | POA: Insufficient documentation

## 2016-02-24 DIAGNOSIS — O359XX0 Maternal care for (suspected) fetal abnormality and damage, unspecified, not applicable or unspecified: Secondary | ICD-10-CM

## 2016-02-24 DIAGNOSIS — O35EXX0 Maternal care for other (suspected) fetal abnormality and damage, fetal genitourinary anomalies, not applicable or unspecified: Secondary | ICD-10-CM

## 2016-02-24 DIAGNOSIS — O358XX0 Maternal care for other (suspected) fetal abnormality and damage, not applicable or unspecified: Secondary | ICD-10-CM

## 2016-02-24 DIAGNOSIS — Z3A30 30 weeks gestation of pregnancy: Secondary | ICD-10-CM | POA: Insufficient documentation

## 2016-02-24 DIAGNOSIS — O09293 Supervision of pregnancy with other poor reproductive or obstetric history, third trimester: Secondary | ICD-10-CM

## 2016-02-27 ENCOUNTER — Telehealth: Payer: Self-pay | Admitting: General Practice

## 2016-02-27 DIAGNOSIS — O24419 Gestational diabetes mellitus in pregnancy, unspecified control: Secondary | ICD-10-CM

## 2016-02-27 NOTE — Telephone Encounter (Signed)
Per Dr Penne LashLeggett, patient has GDM and needs to meet with diabetes education. Called MFM and scheduled appt for 12/14 @ 2:30pm. Called patient with [employer] for interpreter, no answer- left message to call us for results. Will make note in appt tomorrow.

## 2016-02-28 ENCOUNTER — Ambulatory Visit (INDEPENDENT_AMBULATORY_CARE_PROVIDER_SITE_OTHER): Payer: Self-pay | Admitting: Student

## 2016-02-28 DIAGNOSIS — O24419 Gestational diabetes mellitus in pregnancy, unspecified control: Secondary | ICD-10-CM

## 2016-02-28 NOTE — Progress Notes (Signed)
   PRENATAL VISIT NOTE  Subjective:  Aimee Benjamin is a 35 y.o. [id number] at 7583w4d being seen today for ongoing prenatal care.  She is currently monitored for the following issues for this high-risk pregnancy and has Supervision of high risk elderly multigravida in second trimester; AMA (advanced maternal age) multigravida 35+; History of cervical incompetence in pregnancy, currently pregnant; Gestational diabetes; BMI 32.0-32.9,adult; Late prenatal care affecting pregnancy in second trimester; and Bilateral fetal pyelectasis on her problem list.  Patient reports no complaints.  Contractions: Not present.  .  Movement: Present. Denies leaking of fluid.  Discussed results of GTT with patient via Spanish interpreter.   The following portions of the patient's history were reviewed and updated as appropriate: allergies, current medications, past family history, past medical history, past social history, past surgical history and problem list. Problem list updated.  Objective:   Vitals:   02/28/16 1246  BP: (!) 115/54  Pulse: 97  Weight: 180 lb 3.2 oz (81.7 kg)    Fetal Status: Fetal Heart Rate (bpm): 138 Fundal Height: 32 cm Movement: Present     General:  Alert, oriented and cooperative. Patient is in no acute distress.  Skin: Skin is warm and dry. No rash noted.   Cardiovascular: Normal heart rate noted  Respiratory: Normal respiratory effort, no problems with respiration noted  Abdomen: Soft, gravid, appropriate for gestational age. Pain/Pressure: Absent     Pelvic:  Cervical exam deferred        Extremities: Normal range of motion.     Mental Status: Normal mood and affect. Normal behavior. Normal judgment and thought content.   Assessment and Plan:  Pregnancy: [id number] at 683w4d  1. Gestational diabetes mellitus (GDM) in third trimester, gestational diabetes method of control unspecified -testing supplies provided today -appt with diabetes education 12/14  Preterm labor  symptoms and general obstetric precautions including but not limited to vaginal bleeding, contractions, leaking of fluid and fetal movement were reviewed in detail with the patient. Please refer to After Visit Summary for other counseling recommendations.  Return in about 9 days (around 03/08/2016) for High Risk OB; evaluate blood sugars.   Judeth HornErin Brycelynn Stampley, NP

## 2016-02-28 NOTE — Patient Instructions (Signed)
Diabetes mellitus gestacional, cuidados personales (Gestational Diabetes Mellitus, Self Care) El cuidado personal despus del diagnstico de diabetes gestacional (diabetes mellitus gestacional) implica mantener el nivel de glucosa en la sangre bajo control a travs del equilibrio de los siguientes factores:  Nutricin.  Actividad fsica.  Cambios en el estilo de vida.  Medicamentos o insulina, si es necesario.  El apoyo del equipo de mdicos y de Producer, television/film/video. La siguiente informacin explica lo que debe saber para mantener la diabetes gestacional bajo control en su casa. QU DEBO SABER PARA MANTENER LA GLUCEMIA BAJO CONTROL?  [place]. Haga esto con la frecuencia que le haya indicado el mdico.  Comunquese con el mdico si la glucemia est por encima del nivel ideal en 2anlisis seguidos. El [employer] objetivos personalizados de su tratamiento. Generalmente, el objetivo del tratamiento es [employer] siguientes niveles de glucemia durante el embarazo:  Despus de no haber comido durante 8horas (despus de ayunar): igual o menor que 40m/dl (5,372ml/l).  Despus de las comidas (posprandial):  Una hora despus de una comida: igual o menor que 14067ml (7,8mm51ml).  Dos horas despus de una comida: igual o menor que 120mg20m(6,7mmol57m.  Nivel de A1c (hemoglobinaA1c): del 6% al 6,5%. QU DEBO SABER SOBRE LA HIPERGBarrys la hiperglucemia?  La hiperglucemia, tambin llamada glucemia alta, ocurre cuando el nivel de glucosa en la sangre es muy elNewcomerstownrese de conoceRyland Groups tempranos de hiperglucemia, por ejemplo:  Aumento de la sed.  Hambre.  Mucho cansancio.  Necesidad de orinarGarment/textile technologistayor frecuencia que lo habitual.  Visin borrosa. Qu es la hipoglucemia?  La hipoglucemia, tambin llamada glucemia baja, ocurre cuando el nivel de glucosa en la sangre es igual  o menor que 70mg/d41m,9mmol/l61mEl riesgo de hipoglucemia aumenta durante o despus de realizarOptometristad fsica, mientras duerme, cuando est enfermo o si se saltea comidas o no come durante [employer]. Es importanPhotographeromas de la hipoglucemia y tratarla de inmediato. Lleve siempre consigo una colacin de 15gramos hidratos de carbono de accin rpida para tratar la glucemia baja.Los familiares y los amigos cercanos tambin deben conocer [employer], y comprender cmo tratar la hipoglucemia, en caso de que usted no pueda tratarse a s mismo. Cules son los sntomas de la hipoglucemia?  Los sntomas de hipoglucemia pueden incluir los siguientes:  Hambre. [place].  Sudoracin y piel hmeda.  Confusin.  Mareos o sensacin de desvanecimiento.  Somnolencia.  Nuseas.  Aumento de la frecuencia cardaca.  Dolor de cabeza. [place] borrosa.  Convulsiones.  Pesadillas.  Hormigueo o adormecimiento alrededor de la boExxon Mobil Corporationbios o la lengua. Auroraos en el habla.  Disminucin de la capacidad de concentracin.  Cambios en la coordinacin.  Sueo agitado.  Temblores o sacudidas.  Desmayos.  Irritabilidad. Cmo se trata la hipoglucemia?  Si est alerta y puede tragar con seguridad, siga la regla de 15/15, que consiste en lo siguiente:  Tome 15gramos de hidratos de carbono de accin rpida. Las opciones de accin rpida incluyen lo siguiente:  1pomo de glucosa en gel.  3comprimidos de glucosa.  6 a 8unidades de caramelos duros.  4onzas (120ml) de69mo de frutas.  4onzas (120ml) de 80mosa comn (no diettica).  Contrlese la glucemia 15minutos 20mus de ingerir el hidrato de carbono.  Si este nuevo nivel de glucosa en la sangre an es de 70mg/dl o m33m (3,9mmGarment/textile technologist, vu104ma a ingerir 15gramos de  un hidrato de carbono.  Si la glucemia no aumenta por encima de 70mg/dl (3,9mmol/l) despus de 3intentos, solicite ayuda mdica  de emergencia.  Ingiera una comida o una colacin en el transcurso de 1hora despus de que la glucemia se haya normalizado. Cmo se trata la hipoglucemia grave?  La hipoglucemia grave ocurre cuando la glucemia es igual o menor que 54mg/dl (3mmol/l). La hipoglucemia grave es una emergencia. No espere hasta que los sntomas desaparezcan. Solicite atencin mdica de inmediato. Comunquese con el servicio de emergencias de su localidad (911 en los [place]). No conduzca por sus propios medios hasta el hospital. Si tiene hipoglucemia grave y no puede ingerir alimentos o bebidas, tal vez deba aplicarse una inyeccin de glucagn. Un familiar o un amigo cercano deben aprender a controlarle la glucemia y a aplicarle una inyeccin de glucagn. Pregntele al mdico si debe tener disponible un kit de inyecciones de glucagn de emergencia. Es posible que la hipoglucemia grave deba tratarse en un hospital. El tratamiento puede incluir la administracin de glucosa a travs de una va intravenosa (IV). Tambin puede necesitar un tratamiento para tratar la afeccin que est causando la hipoglucemia. QU OTRAS COSAS PUEDO HACER PARA CONTROLAR LA DIABETES GESTACIONAL? Tome los medicamentos para la diabetes como se lo hayan indicado   Si el mdico le recet insulina o medicamentos para la diabetes, tmelos todos los das.  No se quede sin insulina ni cualquier otro medicamento para la diabetes que tome. Planifique con antelacin para tenerlos siempre a su disposicin.  Si usa insulina, ajuste las dosis en funcin de la cantidad de actividad fsica que realiza y de los alimentos que consume. El mdico le indicar cmo ajustar las dosis. Opte por opciones de alimentos saludables  Lo que come y bebe incide en la glucemia. Hacer buenas elecciones ayuda a mantener la diabetes bajo control y a evitar otros problemas de salud. Un plan de alimentacin saludable incluye consumir protenas magras, hidratos de carbono  complejos, frutas y verduras frescas, productos lcteos con bajo contenido de grasa y grasas saludables. Programe una cita con un especialista en alimentacin y nutricin (nutricionista certificado) para que la ayude a armar un plan de alimentacin adecuado para usted. Asegrese de lo siguiente:  Siga las indicaciones del mdico respecto de las restricciones para las comidas o las bebidas.  Beba suficiente lquido para mantener la orina clara o de color amarillo plido.  Ingiera colaciones saludables entre comidas nutritivas.  Haga un seguimiento de los hidratos de carbono que consume. Para hacerlo, lea las etiquetas de informacin nutricional y aprenda cules son los tamaos de las porciones estndar de los alimentos.  Siga el plan para los das de enfermedad cuando no pueda comer o beber normalmente. Arme este plan por adelantado con el mdico. Mantngase activa   Haga al menos 30minutos de actividad fsica por da, o tanta actividad fsica como le recomiende el mdico durante el embarazo.  Hacer 10minutos de actividad fsica 30minutos despus de cada comida puede ayudar a controlar los niveles de glucemia posprandial.  Si comienza un ejercicio o una actividad nuevos, trabaje con el mdico para ajustar la insulina, los medicamentos o la ingesta de comidas segn sea necesario. Opte por un estilo de vida saludable   No beba alcohol.  No consuma ningn producto que contenga tabaco, lo que incluye cigarrillos, tabaco de mascar y cigarrillos electrnicos. Si necesita ayuda para dejar de fumar, consulte al mdico.  Aprenda a manejar el estrs. Si necesita ayuda para lograrlo, consulte a   su mdico. Cuide su cuerpo   Mantngase al da con las vacunas.  Cepllese los dientes y las encas dos veces al da y use hilo dental al menos una vez por da.  Visite al dentista al menos una vez cada 6meses.  Mantenga un peso saludable durante el embarazo. Instrucciones generales   Tome los  medicamentos de venta libre y los recetados solamente como se lo haya indicado el mdico.  Hable con el mdico sobre el riesgo de tener hipertensin arterial durante el embarazo (preeclampsia o eclampsia).  Comparta su plan de control de la diabetes con sus compaeros de trabajo y de la escuela, y con las personas con las que convive.  Controle el nivel de cetonas en la orina durante el embarazo cuando est enferma o como se lo haya indicado el mdico.  Lleve una tarjeta de alerta mdica o use un brazalete o medalla de alerta mdica.  Pregntele al mdico:  Debo reunirme con un instructor para el cuidado de la diabetes?  Dnde puedo encontrar un grupo de apoyo para personas diabticas?  Asista a todas las visitas de control durante el embarazo (prenatal) y despus del parto (posnatal) como se lo haya indicado el mdico. Esto es importante. Procure recibir la atencin que necesita despus del parto   Hgase controlar la glucemia de 4a 12semanas despus del parto. Esto se hace con una prueba de tolerancia a la glucosa oral (PTGO).  Hgase controles de deteccin de la diabetes al menos cada 3aos o con la frecuencia que le haya indicado el mdico. DNDE ENCONTRAR MS INFORMACIN: Para obtener ms informacin sobre la diabetes gestacional, visite los siguientes sitios:  Asociacin Americana de la Diabetes (American Diabetes Association, ADA): www.diabetes.org/diabetes-basics/gestational  Centros para el Control y la Prevencin de Enfermedades (Centers for Disease Control and Prevention, CDC): www.cdc.gov/diabetes/pubs/pdf/gestationalDiabetes.pdf Esta informacin no tiene como fin reemplazar el consejo del mdico. Asegrese de hacerle al mdico cualquier pregunta que tenga. Document Released: 06/27/2015 Document Revised: 06/27/2015 Document Reviewed: 04/08/2015 Elsevier Interactive Patient Education  2017 Elsevier Inc.  

## 2016-02-29 ENCOUNTER — Telehealth: Payer: Self-pay | Admitting: *Deleted

## 2016-02-29 NOTE — Telephone Encounter (Signed)
Received call from Katie @ [facility] stating that there has been a delay in the delivery of pt's Makena medication. It should arrive on 12/14.

## 2016-03-01 ENCOUNTER — Ambulatory Visit (HOSPITAL_COMMUNITY)
Admission: RE | Admit: 2016-03-01 | Discharge: 2016-03-01 | Disposition: A | Discharge status: Home / Self Care | Payer: Self-pay | Source: Ambulatory Visit | Attending: Student | Admitting: Student

## 2016-03-01 ENCOUNTER — Encounter: Payer: Self-pay | Attending: Unknown Physician Specialty | Admitting: *Deleted

## 2016-03-01 DIAGNOSIS — O24419 Gestational diabetes mellitus in pregnancy, unspecified control: Secondary | ICD-10-CM | POA: Insufficient documentation

## 2016-03-01 DIAGNOSIS — O24410 Gestational diabetes mellitus in pregnancy, diet controlled: Secondary | ICD-10-CM

## 2016-03-01 DIAGNOSIS — Z713 Dietary counseling and surveillance: Secondary | ICD-10-CM | POA: Insufficient documentation

## 2016-03-01 NOTE — Progress Notes (Signed)
  Patient was seen on 03/01/2016 for Gestational Diabetes self-management. She arrived with interpretor: Laure Kidney who assisted with the visit. The patient is already checking her BG and her meter history provided the following info:   FBG Post B  Post L  Post S  12/12  129  137  145 12/13  112  152  153  The following learning objectives were met by the patient during this course:   States the definition of Gestational Diabetes  States why dietary management is important in controlling blood glucose  Describes the effects each nutrient has on blood glucose levels  Demonstrates ability to create a balanced meal plan  Demonstrates carbohydrate counting   States when to check blood glucose levels  Demonstrates proper blood glucose monitoring techniques  States the effect of stress and exercise on blood glucose levels  States the importance of limiting caffeine and abstaining from alcohol and smoking  Blood glucose monitor brought by patient: True Track  Patient instructed to monitor glucose levels: FBS: 60 - <95 2 hour: <120  *Patient received handouts:  Nutrition Diabetes and Pregnancy in Spanish  Carbohydrate Counting List in Spanish  Patient will be seen for follow-up as needed.

## 2016-03-08 ENCOUNTER — Ambulatory Visit (INDEPENDENT_AMBULATORY_CARE_PROVIDER_SITE_OTHER): Payer: Self-pay | Admitting: Obstetrics and Gynecology

## 2016-03-08 VITALS — BP 108/59 | HR 105 | Wt 174.9 lb

## 2016-03-08 DIAGNOSIS — Z789 Other specified health status: Secondary | ICD-10-CM

## 2016-03-08 DIAGNOSIS — O09299 Supervision of pregnancy with other poor reproductive or obstetric history, unspecified trimester: Secondary | ICD-10-CM

## 2016-03-08 DIAGNOSIS — O09293 Supervision of pregnancy with other poor reproductive or obstetric history, third trimester: Secondary | ICD-10-CM

## 2016-03-08 DIAGNOSIS — O09522 Supervision of elderly multigravida, second trimester: Secondary | ICD-10-CM

## 2016-03-08 DIAGNOSIS — O09523 Supervision of elderly multigravida, third trimester: Secondary | ICD-10-CM

## 2016-03-08 DIAGNOSIS — O24419 Gestational diabetes mellitus in pregnancy, unspecified control: Secondary | ICD-10-CM

## 2016-03-08 MED ORDER — GLYBURIDE 2.5 MG PO TABS: ORAL_TABLET | ORAL | 1 refills | Status: DC

## 2016-03-08 NOTE — Progress Notes (Signed)
Spanish video interpreter "Maggie" [phone]#[id number] used for visit 17-P today

## 2016-03-08 NOTE — Progress Notes (Signed)
Prenatal Visit Note Date: 03/08/2016 Clinic: [facility]  Subjective:  Aimee Benjamin is a 35 y.o. [phone] at 3934w6d being seen today for ongoing prenatal care.  She is currently monitored for the following issues for this high-risk pregnancy and has Supervision of high risk elderly multigravida in second trimester; AMA (advanced maternal age) multigravida 35+; History of cervical incompetence in pregnancy, currently pregnant; GDM, class A2; BMI 32.0-32.9,adult; Late prenatal care affecting pregnancy in second trimester; and Language barrier on her problem list.  Patient reports no complaints.   Contractions: Not present. Vag. Bleeding: None.  Movement: Present. Denies leaking of fluid.   The following portions of the patient's history were reviewed and updated as appropriate: allergies, current medications, past family history, past medical history, past social history, past surgical history and problem list. Problem list updated.  Objective:   Vitals:   03/08/16 1249  BP: (!) 108/59  Pulse: (!) 105  Weight: 174 lb 14.4 oz (79.3 kg)    Fetal Status: Fetal Heart Rate (bpm): 153   Movement: Present     General:  Alert, oriented and cooperative. Patient is in no acute distress.  Skin: Skin is warm and dry. No rash noted.   Cardiovascular: Normal heart rate noted  Respiratory: Normal respiratory effort, no problems with respiration noted  Abdomen: Soft, gravid, appropriate for gestational age. Pain/Pressure: Present     Pelvic:  Cervical exam deferred        Extremities: Normal range of motion.  Edema: None  Mental Status: Normal mood and affect. Normal behavior. Normal judgment and thought content.   Urinalysis:      Assessment and Plan:  Pregnancy: [id number] at 7334w6d  1. GDM BS log reviewed and recommended starting low dose bid glyburide at 1.25mg  bid. AM fastings in the 100s-120s and 2hr PP at breakfast are in the more than 25%>120s-130s, occasional 130s  after lunch and normal after dinner. D/w pt that likely will have to go up. Also since now GDMa2, will start 2x/week testing nv. mfm growth u/s scheduled for 2wks - US MFM OB FOLLOW UP; Future  2. Language barrier Interpreter used  3. Supervision of high risk elderly multigravida in second trimester D/w pt re: BC nv.   4. Elderly multigravida in third trimester No current issues  5. History of cervical incompetence in pregnancy, currently pregnant 17p today  Preterm labor symptoms and general obstetric precautions including but not limited to vaginal bleeding, contractions, leaking of fluid and fetal movement were reviewed in detail with the patient. Please refer to After Visit Summary for other counseling recommendations.  Return in about 1 week (around 03/15/2016) for rob and to start 2x/week testing.   Aimee Bingharlie Amiah Frohlich, MD

## 2016-03-14 ENCOUNTER — Ambulatory Visit (INDEPENDENT_AMBULATORY_CARE_PROVIDER_SITE_OTHER): Payer: Self-pay | Admitting: Obstetrics and Gynecology

## 2016-03-14 VITALS — BP 104/57 | HR 91 | Wt 175.7 lb

## 2016-03-14 DIAGNOSIS — O09293 Supervision of pregnancy with other poor reproductive or obstetric history, third trimester: Secondary | ICD-10-CM

## 2016-03-14 DIAGNOSIS — O24419 Gestational diabetes mellitus in pregnancy, unspecified control: Secondary | ICD-10-CM

## 2016-03-14 DIAGNOSIS — O09522 Supervision of elderly multigravida, second trimester: Secondary | ICD-10-CM

## 2016-03-14 DIAGNOSIS — O09299 Supervision of pregnancy with other poor reproductive or obstetric history, unspecified trimester: Secondary | ICD-10-CM

## 2016-03-14 DIAGNOSIS — O09523 Supervision of elderly multigravida, third trimester: Secondary | ICD-10-CM

## 2016-03-14 LAB — POCT URINALYSIS DIP (DEVICE)
BILIRUBIN URINE: NEGATIVE
Glucose, UA: NEGATIVE mg/dL
HGB URINE DIPSTICK: NEGATIVE
KETONES UR: 15 mg/dL — AB
Nitrite: NEGATIVE
PH: 7 (ref 5.0–8.0)
PROTEIN: NEGATIVE mg/dL
SPECIFIC GRAVITY, URINE: 1.015 (ref 1.005–1.030)
Urobilinogen, UA: 0.2 mg/dL (ref 0.0–1.0)

## 2016-03-14 NOTE — Progress Notes (Signed)
   PRENATAL VISIT NOTE  Subjective:  Aimee Benjamin is a 35 y.o. [id number] at 3626w5d being seen today for ongoing prenatal care.  She is currently monitored for the following issues for this high-risk pregnancy and has Supervision of high risk elderly multigravida in second trimester; AMA (advanced maternal age) multigravida 35+; History of cervical incompetence in pregnancy, currently pregnant; GDM, class A2; BMI 32.0-32.9,adult; Late prenatal care affecting pregnancy in second trimester; and Language barrier on her problem list.  Patient reports no complaints.  Contractions: Irregular. Vag. Bleeding: None.  Movement: Present. Denies leaking of fluid.   The following portions of the patient's history were reviewed and updated as appropriate: allergies, current medications, past family history, past medical history, past social history, past surgical history and problem list. Problem list updated.  Objective:   Vitals:   03/14/16 1352  BP: (!) 104/57  Pulse: 91  Weight: 175 lb 11.2 oz (79.7 kg)    Fetal Status: Fetal Heart Rate (bpm): NST   Movement: Present     General:  Alert, oriented and cooperative. Patient is in no acute distress.  Skin: Skin is warm and dry. No rash noted.   Cardiovascular: Normal heart rate noted  Respiratory: Normal respiratory effort, no problems with respiration noted  Abdomen: Soft, gravid, appropriate for gestational age. Pain/Pressure: Present     Pelvic:  Cervical exam deferred        Extremities: Normal range of motion.     Mental Status: Normal mood and affect. Normal behavior. Normal judgment and thought content.   Assessment and Plan:  Pregnancy: [id number] at 7526w5d  1. Elderly multigravida in third trimester Declined genetic testing  2. Gestational diabetes mellitus (GDM) in third trimester, gestational diabetes method of control unspecified CBGs reviewed and fasting 86-99, majority in the low 90's. Pp within range. Encouraged patient to consume  a protein rich snack Follow up growth us on 1/5 Continue glyburide 1.25 mg BID - Fetal nonstress test- reviewed and reactive  3. Supervision of high risk elderly multigravida in second trimester Patient is doing well without complaints  4. GDM, class A2   5. History of cervical incompetence in pregnancy, currently pregnant Continue weekly 17-P  Preterm labor symptoms and general obstetric precautions including but not limited to vaginal bleeding, contractions, leaking of fluid and fetal movement were reviewed in detail with the patient. Please refer to After Visit Summary for other counseling recommendations.  Return in about 2 days (around 03/16/2016) for NST/AFI - may schedule @ 1030;  1/2  Ob fu and NST.   Catalina AntiguaPeggy Ruhan Borak, MD

## 2016-03-14 NOTE — Progress Notes (Signed)
US for growth scheduled 03/23/16

## 2016-03-16 ENCOUNTER — Ambulatory Visit (INDEPENDENT_AMBULATORY_CARE_PROVIDER_SITE_OTHER): Payer: Self-pay | Admitting: *Deleted

## 2016-03-16 ENCOUNTER — Ambulatory Visit: Payer: Self-pay

## 2016-03-16 VITALS — BP 114/62 | HR 94

## 2016-03-16 DIAGNOSIS — O24419 Gestational diabetes mellitus in pregnancy, unspecified control: Secondary | ICD-10-CM

## 2016-03-16 NOTE — Progress Notes (Signed)
Interpreter Raquel Mora present for encounter.  

## 2016-03-19 NOTE — L&D Delivery Note (Signed)
36 y.o. [id number] at 3964w1d delivered a viable female infant in cephalic, OA position. Loose nuchal cord x1, reduced after delivery at perineum. Right anterior shoulder delivered with ease. 60 sec delayed cord clamping. Cord clamped x2 and cut. Placenta delivered spontaneously intact, with 3VC. Fundus firm on exam with massage and pitocin. Good hemostasis noted.  Fundus was firm, however patient was having blood continuously drip per vagina, therefore 800 mcg cytotec was given PR. Further clots were expressed from uterus with fundal massage as well.   Anesthesia: Epidural Laceration: None Suture: N/A Good hemostasis noted. EBL: 150cc  Mom and baby recovering in LDR.    Apgars: 8/9  Weight: 3442g (7lb 9.4 oz)    Jen MowElizabeth Mumaw, DO OB Fellow [facility] 04/28/2016, 4:39 PM

## 2016-03-20 ENCOUNTER — Ambulatory Visit (INDEPENDENT_AMBULATORY_CARE_PROVIDER_SITE_OTHER): Payer: Self-pay | Admitting: Advanced Practice Midwife

## 2016-03-20 VITALS — BP 115/54 | HR 93 | Wt 176.3 lb

## 2016-03-20 DIAGNOSIS — O24419 Gestational diabetes mellitus in pregnancy, unspecified control: Secondary | ICD-10-CM

## 2016-03-20 DIAGNOSIS — Z789 Other specified health status: Secondary | ICD-10-CM

## 2016-03-20 DIAGNOSIS — O09299 Supervision of pregnancy with other poor reproductive or obstetric history, unspecified trimester: Secondary | ICD-10-CM

## 2016-03-20 DIAGNOSIS — O09213 Supervision of pregnancy with history of pre-term labor, third trimester: Secondary | ICD-10-CM

## 2016-03-20 NOTE — Progress Notes (Signed)
   PRENATAL VISIT NOTE  Subjective:  [name] is a 36 y.o. [id number] at 3960w4d being seen today for ongoing prenatal care.  She is currently monitored for the following issues for this high-risk pregnancy and has Supervision of high risk elderly multigravida in second trimester; AMA (advanced maternal age) multigravida 35+; History of cervical incompetence in pregnancy, currently pregnant; GDM, class A2; BMI 32.0-32.9,adult; Late prenatal care affecting pregnancy in second trimester; and Language barrier on her problem list.  Patient reports no complaints.  Contractions: Irregular. Vag. Bleeding: None.  Movement: Present. Denies leaking of fluid.   The following portions of the patient's history were reviewed and updated as appropriate: allergies, current medications, past family history, past medical history, past social history, past surgical history and problem list. Problem list updated.  Objective:   Vitals:   03/20/16 1459  BP: (!) 115/54  Pulse: 93  Weight: 176 lb 4.8 oz (80 kg)    Fetal Status: Fetal Heart Rate (bpm): NST   Movement: Present     General:  Alert, oriented and cooperative. Patient is in no acute distress.  Skin: Skin is warm and dry. No rash noted.   Cardiovascular: Normal heart rate noted  Respiratory: Normal respiratory effort, no problems with respiration noted  Abdomen: Soft, gravid, appropriate for gestational age. Pain/Pressure: Present     Pelvic:  Cervical exam deferred        Extremities: Normal range of motion.  Edema: None  Mental Status: Normal mood and affect. Normal behavior. Normal judgment and thought content.   Assessment and Plan:  Pregnancy: [id number] at 7460w4d  1. Gestational diabetes mellitus (GDM) in third trimester, gestational diabetes method of control unspecified --Reviewed glucose log.  Fastings 90s to low 110s.  PP all wnl except 4 out of 15, all 135-154, mostly after breakfast.  Discussed dietary choices. No changes to  management today. - Fetal nonstress test - US MFM FETAL BPP WO NON STRESS; Future  2. Language barrier --Video interpreter spanish language used.  3. History of cervical incompetence in pregnancy, currently pregnant --17-P today  Preterm labor symptoms and general obstetric precautions including but not limited to vaginal bleeding, contractions, leaking of fluid and fetal movement were reviewed in detail with the patient. Please refer to After Visit Summary for other counseling recommendations.  Return in about 9 days (around 03/29/2016) for NST/AFI only; 1/15 Ob fu and NST.   Hurshel PartyLisa A Leftwich-Kirby, CNM

## 2016-03-20 NOTE — Progress Notes (Signed)
[employer] video interpreter Moises [phone]#[id number] used for encounter. US for growth and BPP on 1/5

## 2016-03-23 ENCOUNTER — Other Ambulatory Visit: Payer: Self-pay | Admitting: Obstetrics and Gynecology

## 2016-03-23 ENCOUNTER — Ambulatory Visit (HOSPITAL_COMMUNITY)
Admission: RE | Admit: 2016-03-23 | Discharge: 2016-03-23 | Disposition: A | Discharge status: Home / Self Care | Payer: Self-pay | Source: Ambulatory Visit | Attending: Obstetrics and Gynecology | Admitting: Obstetrics and Gynecology

## 2016-03-23 ENCOUNTER — Encounter (HOSPITAL_COMMUNITY): Payer: Self-pay

## 2016-03-23 ENCOUNTER — Other Ambulatory Visit (HOSPITAL_COMMUNITY): Payer: Self-pay | Admitting: *Deleted

## 2016-03-23 DIAGNOSIS — Z3A34 34 weeks gestation of pregnancy: Secondary | ICD-10-CM

## 2016-03-23 DIAGNOSIS — O09292 Supervision of pregnancy with other poor reproductive or obstetric history, second trimester: Secondary | ICD-10-CM

## 2016-03-23 DIAGNOSIS — O09219 Supervision of pregnancy with history of pre-term labor, unspecified trimester: Secondary | ICD-10-CM | POA: Insufficient documentation

## 2016-03-23 DIAGNOSIS — O09523 Supervision of elderly multigravida, third trimester: Secondary | ICD-10-CM

## 2016-03-23 DIAGNOSIS — O24419 Gestational diabetes mellitus in pregnancy, unspecified control: Secondary | ICD-10-CM | POA: Insufficient documentation

## 2016-03-23 DIAGNOSIS — O24415 Gestational diabetes mellitus in pregnancy, controlled by oral hypoglycemic drugs: Secondary | ICD-10-CM

## 2016-03-23 DIAGNOSIS — O09522 Supervision of elderly multigravida, second trimester: Secondary | ICD-10-CM | POA: Insufficient documentation

## 2016-03-29 ENCOUNTER — Ambulatory Visit (INDEPENDENT_AMBULATORY_CARE_PROVIDER_SITE_OTHER): Payer: Self-pay | Admitting: *Deleted

## 2016-03-29 ENCOUNTER — Ambulatory Visit: Payer: Self-pay

## 2016-03-29 VITALS — BP 95/43 | HR 80

## 2016-03-29 DIAGNOSIS — Z3689 Encounter for other specified antenatal screening: Secondary | ICD-10-CM

## 2016-03-29 DIAGNOSIS — O24419 Gestational diabetes mellitus in pregnancy, unspecified control: Secondary | ICD-10-CM

## 2016-03-29 DIAGNOSIS — O09293 Supervision of pregnancy with other poor reproductive or obstetric history, third trimester: Secondary | ICD-10-CM

## 2016-03-29 DIAGNOSIS — O09299 Supervision of pregnancy with other poor reproductive or obstetric history, unspecified trimester: Secondary | ICD-10-CM

## 2016-03-29 NOTE — Progress Notes (Signed)
Pt informed that the ultrasound is considered a limited OB ultrasound and is not intended to be a complete ultrasound exam.  Patient also informed that the ultrasound is not being completed with the intent of assessing for fetal or placental anomalies or any pelvic abnormalities.  Explained that the purpose of today's ultrasound is to assess for presentation and amniotic fluid volume.  Patient acknowledges the purpose of the exam and the limitations of the study.    

## 2016-04-02 ENCOUNTER — Ambulatory Visit (INDEPENDENT_AMBULATORY_CARE_PROVIDER_SITE_OTHER): Payer: Self-pay | Admitting: Obstetrics & Gynecology

## 2016-04-02 VITALS — BP 116/71 | HR 97 | Wt 175.2 lb

## 2016-04-02 DIAGNOSIS — Z113 Encounter for screening for infections with a predominantly sexual mode of transmission: Secondary | ICD-10-CM

## 2016-04-02 DIAGNOSIS — O24419 Gestational diabetes mellitus in pregnancy, unspecified control: Secondary | ICD-10-CM

## 2016-04-02 DIAGNOSIS — O09522 Supervision of elderly multigravida, second trimester: Secondary | ICD-10-CM

## 2016-04-02 LAB — OB RESULTS CONSOLE GBS: STREP GROUP B AG: POSITIVE

## 2016-04-02 NOTE — Progress Notes (Signed)
C/o itching on arms from the elbow down, using lotion regularly 17-p injection today Spanish interpreter [place] present for visit. Lajean SaverJ Bellamy    PRENATAL VISIT NOTE  Subjective:  Aimee Benjamin is a 36 y.o. [phone] at 55106w3d being seen today for ongoing prenatal care.  She is currently monitored for the following issues for this high-risk pregnancy and has Supervision of high risk elderly multigravida in second trimester; AMA (advanced maternal age) multigravida 35+; History of cervical incompetence in pregnancy, currently pregnant; GDM, class A2; BMI 32.0-32.9,adult; Late prenatal care affecting pregnancy in second trimester; and Language barrier on her problem list.  Patient reports not eating breakfast.  Contractions: Irregular. Vag. Bleeding: None.  Movement: Present. Denies leaking of fluid.   The following portions of the patient's history were reviewed and updated as appropriate: allergies, current medications, past family history, past medical history, past social history, past surgical history and problem list. Problem list updated.  Objective:   Vitals:   04/02/16 0958  BP: 116/71  Pulse: 97  Weight: 175 lb 3.2 oz (79.5 kg)    Fetal Status: Fetal Heart Rate (bpm): NST   Movement: Present     General:  Alert, oriented and cooperative. Patient is in no acute distress.  Skin: Skin is warm and dry. No rash noted.   Cardiovascular: Normal heart rate noted  Respiratory: Normal respiratory effort, no problems with respiration noted  Abdomen: Soft, gravid, appropriate for gestational age. Pain/Pressure: Present     Pelvic:  Cervical exam performed      ft/long/posterior  Extremities: Normal range of motion.  Edema: Trace  Mental Status: Normal mood and affect. Normal behavior. Normal judgment and thought content.   Assessment and Plan:  Pregnancy: [id number] at 80106w3d  1. Supervision of high risk elderly multigravida in second trimester -All CBGs are WNL even though she is not  eating breakfast.  She eats peanut butter crackers at 10 am, lunch at 11 am, apple at 3 pm, cheese at 4-5 pm, dinner at 7 pm -GBS today -US for growth 35 1/2 weeks -Antenatal testing 2x week. - no 17P available today.  Calling pharmacy and RN will call pt when it is available for the last shot.    Preterm labor symptoms and general obstetric precautions including but not limited to vaginal bleeding, contractions, leaking of fluid and fetal movement were reviewed in detail with the patient. Please refer to After Visit Summary for other counseling recommendations.  No Follow-up on file.   Lesly DukesKelly H Coline Calkin, MD

## 2016-04-03 LAB — GC/CHLAMYDIA PROBE AMP (~~LOC~~) NOT AT ARMC
CHLAMYDIA, DNA PROBE: NEGATIVE
NEISSERIA GONORRHEA: NEGATIVE

## 2016-04-05 ENCOUNTER — Other Ambulatory Visit: Payer: Self-pay | Admitting: Family Medicine

## 2016-04-05 ENCOUNTER — Telehealth: Payer: Self-pay | Admitting: *Deleted

## 2016-04-05 NOTE — Telephone Encounter (Signed)
Called pt w/[employer] interpreter Paulette # [phone] and left message stating that I am calling to check on her because she missed an appt today in our office. Her next appt is on 1/22 @ 1240.  She should go to [facility] for evaluation if her baby is not moving well or if she is having any other problems or concerns.

## 2016-04-06 LAB — CULTURE, STREPTOCOCCUS GRP B W/SUSCEPT

## 2016-04-09 ENCOUNTER — Encounter: Payer: Self-pay | Admitting: Obstetrics and Gynecology

## 2016-04-09 ENCOUNTER — Ambulatory Visit (INDEPENDENT_AMBULATORY_CARE_PROVIDER_SITE_OTHER): Payer: Self-pay | Admitting: Obstetrics and Gynecology

## 2016-04-09 VITALS — BP 104/55 | HR 90 | Wt 174.9 lb

## 2016-04-09 DIAGNOSIS — O09522 Supervision of elderly multigravida, second trimester: Secondary | ICD-10-CM

## 2016-04-09 DIAGNOSIS — Z789 Other specified health status: Secondary | ICD-10-CM

## 2016-04-09 DIAGNOSIS — O09293 Supervision of pregnancy with other poor reproductive or obstetric history, third trimester: Secondary | ICD-10-CM

## 2016-04-09 DIAGNOSIS — O99820 Streptococcus B carrier state complicating pregnancy: Secondary | ICD-10-CM | POA: Insufficient documentation

## 2016-04-09 DIAGNOSIS — O09523 Supervision of elderly multigravida, third trimester: Secondary | ICD-10-CM

## 2016-04-09 DIAGNOSIS — O09299 Supervision of pregnancy with other poor reproductive or obstetric history, unspecified trimester: Secondary | ICD-10-CM

## 2016-04-09 DIAGNOSIS — O24419 Gestational diabetes mellitus in pregnancy, unspecified control: Secondary | ICD-10-CM

## 2016-04-09 NOTE — Progress Notes (Signed)
Used [employer] Video interpreter Paula ComptonKarla [phone].

## 2016-04-09 NOTE — Progress Notes (Signed)
Prenatal Visit Note Date: 04/09/2016 Clinic: [facility]  Subjective:  Aimee Benjamin is a [age] y.o. [phone] at 5423w3d being seen today for ongoing prenatal care.  She is currently monitored for the following issues for this high-risk pregnancy and has Supervision of high risk elderly multigravida in second trimester; AMA (advanced maternal age) multigravida 35+; History of cervical incompetence in pregnancy, currently pregnant; GDM, class A2; BMI 32.0-32.9,adult; Late prenatal care affecting pregnancy in second trimester; Language barrier; and GBS + with PCN allergy, clinda sensitive on her problem list.  Patient reports still having bilateral arm and forearm itching; has tried lotions but no change in s/s.  Contractions: Irregular. Vag. Bleeding: None.  Movement: Present. Denies leaking of fluid.   The following portions of the patient's history were reviewed and updated as appropriate: allergies, current medications, past family history, past medical history, past social history, past surgical history and problem list. Problem list updated.  Objective:   Vitals:   04/09/16 1247  BP: (!) 104/55  Pulse: 90  Weight: 174 lb 14.4 oz (79.3 kg)    Fetal Status: Fetal Heart Rate (bpm): NST   Movement: Present     General:  Alert, oriented and cooperative. Patient is in no acute distress.  Skin: Skin is warm and dry. No rash noted.   Cardiovascular: Normal heart rate noted  Respiratory: Normal respiratory effort, no problems with respiration noted  Abdomen: Soft, gravid, appropriate for gestational age. Pain/Pressure: Present     Pelvic:  Cervical exam deferred        Extremities: Normal range of motion.  Edema: Trace  Mental Status: Normal mood and affect. Normal behavior. Normal judgment and thought content.   Urinalysis:      Assessment and Plan:  Pregnancy: [id number] at 4123w3d  1. Supervision of high risk elderly multigravida in second trimester Routine care. No  other itching anywhere else and arms are negative and patient denies any new soaps, furniture, clothes, etc. Pt told if itching occurs anywhere else on body to let us know.  Nexplanon  2. Elderly multigravida in third trimester Declined any testing except u/s (negative)  3. GDM, class A2 Forgot book and meter today but gives normal AM fasting and 2hr PP values. Continue with glyburide 1.25 with breakfast and dinner. Has repeat growth on on 1/26 (1/5 with normal EFW, AFI with AC at 97%). rNST today (135 baseline, +accels, no decel, mod variability with negative toco x 721m). AFI not done today for whatever reason.  - Fetal nonstress test  4. Language barrier Interpreter used  5. History of cervical incompetence in pregnancy, currently pregnant Last 17p today.  6. GBS + with PCN allergy, clinda sensitive Pt states has SOB and anxiety with PCN. Recommend clinda. Pt aware of GBS + status.   Preterm labor symptoms and general obstetric precautions including but not limited to vaginal bleeding, contractions, leaking of fluid and fetal movement were reviewed in detail with the patient. Please refer to After Visit Summary for other counseling recommendations.  Return in about 4 days (around 04/13/2016) for NST only before US - ?0840 or 0900; 1/19  Ob fu and NST.   Cearfoss Bingharlie Chirsty Armistead, MD

## 2016-04-13 ENCOUNTER — Other Ambulatory Visit (HOSPITAL_COMMUNITY): Payer: Self-pay | Admitting: Maternal and Fetal Medicine

## 2016-04-13 ENCOUNTER — Encounter (HOSPITAL_COMMUNITY): Payer: Self-pay

## 2016-04-13 ENCOUNTER — Ambulatory Visit (HOSPITAL_COMMUNITY): Payer: Self-pay

## 2016-04-13 ENCOUNTER — Ambulatory Visit (INDEPENDENT_AMBULATORY_CARE_PROVIDER_SITE_OTHER): Payer: Self-pay | Admitting: Obstetrics & Gynecology

## 2016-04-13 ENCOUNTER — Ambulatory Visit (HOSPITAL_COMMUNITY)
Admission: RE | Admit: 2016-04-13 | Discharge: 2016-04-13 | Disposition: A | Discharge status: Home / Self Care | Payer: Self-pay | Source: Ambulatory Visit | Attending: Obstetrics and Gynecology | Admitting: Obstetrics and Gynecology

## 2016-04-13 VITALS — BP 104/55 | HR 86 | Wt 172.8 lb

## 2016-04-13 VITALS — BP 106/54 | HR 87

## 2016-04-13 DIAGNOSIS — Z3A37 37 weeks gestation of pregnancy: Secondary | ICD-10-CM

## 2016-04-13 DIAGNOSIS — O4103X0 Oligohydramnios, third trimester, not applicable or unspecified: Secondary | ICD-10-CM

## 2016-04-13 DIAGNOSIS — O24415 Gestational diabetes mellitus in pregnancy, controlled by oral hypoglycemic drugs: Secondary | ICD-10-CM

## 2016-04-13 DIAGNOSIS — O24419 Gestational diabetes mellitus in pregnancy, unspecified control: Secondary | ICD-10-CM

## 2016-04-13 NOTE — Addendum Note (Signed)
Encounter addended by: Marcellina MillinKelly L Swayzee Wadley on: 04/13/2016 10:19 AM<BR>    Actions taken: Imaging Exam ended

## 2016-04-13 NOTE — Progress Notes (Signed)
[employer] video interpreter # [phone] used for encounter. US for growth today.

## 2016-04-13 NOTE — Addendum Note (Signed)
Encounter addended by: Heidi DachMelanie A Robb, RN on: 04/13/2016  2:36 PM<BR>    Actions taken: Visit diagnoses modified, Order list changed, Diagnosis association updated

## 2016-04-13 NOTE — Progress Notes (Signed)
NST reactive today.

## 2016-04-16 ENCOUNTER — Ambulatory Visit (HOSPITAL_COMMUNITY)
Admission: RE | Admit: 2016-04-16 | Discharge: 2016-04-16 | Disposition: A | Discharge status: Home / Self Care | Payer: Self-pay | Source: Ambulatory Visit | Attending: Obstetrics and Gynecology | Admitting: Obstetrics and Gynecology

## 2016-04-16 ENCOUNTER — Other Ambulatory Visit (HOSPITAL_COMMUNITY): Payer: Self-pay | Admitting: Obstetrics and Gynecology

## 2016-04-16 ENCOUNTER — Ambulatory Visit (INDEPENDENT_AMBULATORY_CARE_PROVIDER_SITE_OTHER): Payer: Self-pay | Admitting: Obstetrics and Gynecology

## 2016-04-16 ENCOUNTER — Encounter (HOSPITAL_COMMUNITY): Payer: Self-pay

## 2016-04-16 VITALS — BP 109/54 | HR 89 | Wt 174.6 lb

## 2016-04-16 DIAGNOSIS — Z3A37 37 weeks gestation of pregnancy: Secondary | ICD-10-CM | POA: Insufficient documentation

## 2016-04-16 DIAGNOSIS — O24415 Gestational diabetes mellitus in pregnancy, controlled by oral hypoglycemic drugs: Secondary | ICD-10-CM | POA: Insufficient documentation

## 2016-04-16 DIAGNOSIS — O322XX0 Maternal care for transverse and oblique lie, not applicable or unspecified: Secondary | ICD-10-CM | POA: Insufficient documentation

## 2016-04-16 DIAGNOSIS — Z789 Other specified health status: Secondary | ICD-10-CM

## 2016-04-16 DIAGNOSIS — O4103X0 Oligohydramnios, third trimester, not applicable or unspecified: Secondary | ICD-10-CM

## 2016-04-16 DIAGNOSIS — O09213 Supervision of pregnancy with history of pre-term labor, third trimester: Secondary | ICD-10-CM | POA: Insufficient documentation

## 2016-04-16 DIAGNOSIS — O09523 Supervision of elderly multigravida, third trimester: Secondary | ICD-10-CM | POA: Insufficient documentation

## 2016-04-16 DIAGNOSIS — O99820 Streptococcus B carrier state complicating pregnancy: Secondary | ICD-10-CM

## 2016-04-16 DIAGNOSIS — O09522 Supervision of elderly multigravida, second trimester: Secondary | ICD-10-CM

## 2016-04-16 DIAGNOSIS — O24419 Gestational diabetes mellitus in pregnancy, unspecified control: Secondary | ICD-10-CM

## 2016-04-16 NOTE — Progress Notes (Signed)
US for growth and BPP done 1/26 - pt found to have oligohydramnios.  BPP repeated today - 8/8, AFI normal, transverse position. .Marland Kitchen

## 2016-04-16 NOTE — Progress Notes (Signed)
Subjective:  Aimee Benjamin is a 36 y.o. [phone] [id number] at 7652w3d being seen today for ongoing prenatal care.  She is currently monitored for the following issues for this high-risk pregnancy and has Supervision of high risk elderly multigravida in second trimester; AMA (advanced maternal age) multigravida 35+; History of cervical incompetence in pregnancy, currently pregnant; GDM, class A2; BMI 32.0-32.9,adult; Late prenatal care affecting pregnancy in second trimester; Language barrier; GBS + with PCN allergy, clinda sensitive; and Transverse lie of fetus on her problem list.  Patient reports no complaints.  Contractions: Irregular. Vag. Bleeding: None.  Movement: Present. Denies leaking of fluid.   The following portions of the patient's history were reviewed and updated as appropriate: allergies, current medications, past family history, past medical history, past social history, past surgical history and problem list. Problem list updated.  Objective:   Vitals:   04/16/16 1252  BP: (!) 109/54  Pulse: 89  Weight: 174 lb 9.6 oz (79.2 kg)    Fetal Status: Fetal Heart Rate (bpm): NST   Movement: Present     General:  Alert, oriented and cooperative. Patient is in no acute distress.  Skin: Skin is warm and dry. No rash noted.   Cardiovascular: Normal heart rate noted  Respiratory: Normal respiratory effort, no problems with respiration noted  Abdomen: Soft, gravid, appropriate for gestational age. Pain/Pressure: Present     Pelvic:  Cervical exam deferred        Extremities: Normal range of motion.  Edema: Trace  Mental Status: Normal mood and affect. Normal behavior. Normal judgment and thought content.   Urinalysis:      Assessment and Plan:  Pregnancy: [id number] at 7252w3d  1. GDM, class A2 BS in goal range Continue with antenatal testing - Fetal nonstress test  2. Supervision of high risk elderly multigravida in second trimester Labor precautions  3. Language barrier Interrupter  used today  4. GBS + with PCN allergy, clinda sensitive Tx while in labor  5. Elderly multigravida in third trimester   6. Transverse lie of fetus, single or unspecified fetus Discussed version with pt. She is agreeable for attempted version. Scheduled for this Wednesday with Dr. Shawnie PonsPratt  Term labor symptoms and general obstetric precautions including but not limited to vaginal bleeding, contractions, leaking of fluid and fetal movement were reviewed in detail with the patient. Please refer to After Visit Summary for other counseling recommendations.  Return in about 3 days (around 04/19/2016) for NST only;  2/5  Ob fu and NST/AFI.   Hermina StaggersMichael L Dawayne Ohair, MD

## 2016-04-16 NOTE — Patient Instructions (Signed)
Versin ceflica externa (External Cephalic Version) La versin ceflica externa es dar vuelta a un beb que tiene las nalgas hacia adelante o est de costado en el tero (trasverso) para colocarlo en una posicin de cabeza. Esto agiliza el trabajo de parto y el nacimiento, lo hace ms seguro para la madre y el beb y Sports coachdisminuye la posibilidad de Warehouse managertener que realizar una cesrea. No debera realizarse hasta que el embarazo sea de 36 semanas o ms. ANTES DEL PROCEDIMIENTO  No tome aspirina  No coma durante las cuatro horas previas al procedimiento.  Comente al mdico si tiene resfro, fiebre o una infeccin.  Informe a su mdico si sufre contracciones.  Informe al mdico si tiene una prdida de lquido por la vagina.  Informe al mdico si tiene hemorragia vaginal o secrecin anormal.  Si es admitida el mismo da de la Kenov[place]iruga, Oceanographerconcurra al hospital al menos una hora antes del procedimiento para leer y Oceanographerfirmar los formularios y Development worker, communityconsentimiento, y Theme park managerestar preparada.  Consulte con el profesional que lo asiste si ha tenido algn problema con anestsicos anteriormente.  Informe al mdico si ha estado tomado medicamentos. Esto incluye medicamentos de venta libre y con receta, hierbas, gotas oftlmicas y cremas. PROCEDIMIENTO  Primero se realiza un ultrasonido para asegurarse de que el beb est de nalgas o trasverso.  Se realizar una prueba de no estrs o perfil biofsico al beb antes de la VCE. Esto se realiza para asegurarse de que es seguro realizar la VCE al beb. Tambin podr realizarse despus del procedimiento para asegurarse de que el beb est bien.  La VCE se realiza en la sala de parto con la presencia de Heritage managerun anestesilogo. Deber haber instrumental para una cesrea de emergencia con un equipo de mdicos disponible.  Se le dar a la paciente medicacin para Yahoorelajar los msculos uterinos. Se podr administrar una vacuna epidural para cualquier molestia. Esto es til para el xito de la  VCE.  Tambin se coloca un monitor electrnico fetal durante el procedimiento para asegurarse de que el beb est bien.  Si la madre es Rh negativo, se administrar Rho-gam para prevenir problemas de Rh en los embarazos futuros.  La madre quedar en observacin durante 2 a 3 horas despus del procedimiento para asegurarse de que no ha habido problemas. BENEFICIOS DE LA VCE  Trabajo de parto ms fcil y seguro para la madre y para el beb.  Menor incidencia de Copycesrea.  Menores costos por parto vaginal. RIESGOS DE LA VCE  La placenta se desplaza de la pared del tero antes del parto (abrupcin de la placenta).  Ruptura del tero, en especial en pacientes con corte de cesrea previo.  Estrs fetal.  Parto prematuro.  Ruptura prematura de las High Bridgemembranas.  El beb puede volver a ponerse de nalgas o trasverso.  Puede ocurrir Newmont Miningmuerte fetal, pero esto es muy poco comn. LA VCE DEBER DETENERSE SI:  El ritmo cardaco fetal decae.  La madre siente AmerisourceBergen Corporationmucho dolor.  No se puede dar vuelta al beb despus de varios intentos. LA VCE NO DEBER EFECTUARSE SI:  La prueba de no estrs o perfil biofsico es anormal.  Hay hemorragia vaginal.  Forma anormal del tero.  Hay insuficiencia cardaca o presin alta no controlada en la madre.  Embarazo de gemelos o ms bebs.  La placenta cubre la apertura del cuello del tero (placenta previa).  Ha tenido una cesrea anterior con una incisin clsica o ciruga mayor del tero.  Insuficiencia de lquido Ross Storesamnitico en la  bolsa del beb (oligohidramnios).  El beb es muy pequeo o no se ha desarrollado normalmente (anomala).  Ruptura de Schenectadymembranas. INSTRUCCIONES PARA EL CUIDADO DOMICILIARIO  Pdale a alguna persona que la lleve hasta su domicilio despus del procedimiento.  Haga reposo en su casa durante varias horas.  Haga que alguien permanezca con usted cuando regrese a su casa, al menos durante las primeras horas.  Luego de la VCE,  contine con las actividades prenatales segn se le haya indicado.  Contine con su dieta normal, reposo y [address] Wood Johnson Placeactividades.  No realice actividades estresantes por un par Kinder Morgan Energyde das. SOLICITE ATENCIN MDICA DE INMEDIATO SI:  Presenta una hemorragia vaginal abundante.  Presenta una secrecin vaginal (la bolsa de agua podra haberse roto).  Tiene contracciones uterinas.  No siente que el beb se mueve, o percibe menos movimientos que antes.  Siente dolor abdominal.  La temperatura oral se eleva por encima de 38,9C (102F) o mayor. Esta informacin no tiene Theme park managercomo fin reemplazar el consejo del mdico. Asegrese de hacerle al mdico cualquier pregunta que tenga. Document Released: 08/22/2007 Document Revised: 06/27/2015 Document Reviewed: 03/19/2015 Elsevier Interactive Patient Education  2017 [employer]

## 2016-04-17 ENCOUNTER — Telehealth (HOSPITAL_COMMUNITY): Payer: Self-pay | Admitting: *Deleted

## 2016-04-17 NOTE — Telephone Encounter (Signed)
Preadmission screen  

## 2016-04-17 NOTE — Telephone Encounter (Signed)
Preadmission screen Interpreter number [phone]

## 2016-04-18 ENCOUNTER — Inpatient Hospital Stay (HOSPITAL_COMMUNITY)
Admission: RE | Admit: 2016-04-18 | Discharge: 2016-04-18 | DRG: 951 | Disposition: A | Discharge status: Home / Self Care | Payer: Self-pay | Source: Ambulatory Visit | Attending: Family Medicine | Admitting: Family Medicine

## 2016-04-18 DIAGNOSIS — O329XX0 Maternal care for malpresentation of fetus, unspecified, not applicable or unspecified: Secondary | ICD-10-CM | POA: Diagnosis present

## 2016-04-18 DIAGNOSIS — Z3483 Encounter for supervision of other normal pregnancy, third trimester: Principal | ICD-10-CM

## 2016-04-18 DIAGNOSIS — O320XX0 Maternal care for unstable lie, not applicable or unspecified: Secondary | ICD-10-CM

## 2016-04-18 MED ORDER — LIDOCAINE HCL (PF) 1 % IJ SOLN: 30.0000 mL | INTRAMUSCULAR | Status: DC | PRN

## 2016-04-18 MED ORDER — OXYCODONE-ACETAMINOPHEN 5-325 MG PO TABS: 1.0000 | ORAL_TABLET | ORAL | Status: DC | PRN

## 2016-04-18 MED ORDER — ACETAMINOPHEN 325 MG PO TABS: 650.0000 mg | ORAL_TABLET | ORAL | Status: DC | PRN

## 2016-04-18 MED ORDER — ONDANSETRON HCL 4 MG/2ML IJ SOLN: 4.0000 mg | Freq: Four times a day (QID) | INTRAMUSCULAR | Status: DC | PRN

## 2016-04-18 MED ORDER — OXYTOCIN 40 UNITS IN LACTATED RINGERS INFUSION - SIMPLE MED: 2.5000 [IU]/h | INTRAVENOUS | Status: DC

## 2016-04-18 MED ORDER — LACTATED RINGERS IV SOLN: INTRAVENOUS | Status: DC

## 2016-04-18 MED ORDER — CLINDAMYCIN PHOSPHATE 900 MG/50ML IV SOLN
900.0000 mg | Freq: Three times a day (TID) | INTRAVENOUS | Status: DC
  Filled 2016-04-18: qty 50

## 2016-04-18 MED ORDER — OXYCODONE-ACETAMINOPHEN 5-325 MG PO TABS: 2.0000 | ORAL_TABLET | ORAL | Status: DC | PRN

## 2016-04-18 MED ORDER — LACTATED RINGERS IV SOLN: 500.0000 mL | INTRAVENOUS | Status: DC | PRN

## 2016-04-18 MED ORDER — FLEET ENEMA 7-19 GM/118ML RE ENEM: 1.0000 | ENEMA | RECTAL | Status: DC | PRN

## 2016-04-18 MED ORDER — SOD CITRATE-CITRIC ACID 500-334 MG/5ML PO SOLN: 30.0000 mL | ORAL | Status: DC | PRN

## 2016-04-18 MED ORDER — OXYTOCIN BOLUS FROM INFUSION: 500.0000 mL | Freq: Once | INTRAVENOUS | Status: DC

## 2016-04-18 NOTE — Progress Notes (Signed)
OB Note  On u/s fetus is oblique with head vertex and about 10 degrees off from midline. Normal FHR and subjectively normal fluid. Given this, if rNST, will d/c to home and do u/s for presentation with every visit.  Cornelia Copaharlie Flora Parks, Jr MD Attending [facility] 04/18/2016 Time: [phone]

## 2016-04-19 ENCOUNTER — Other Ambulatory Visit: Payer: Self-pay | Admitting: Obstetrics & Gynecology

## 2016-04-23 ENCOUNTER — Ambulatory Visit: Payer: Self-pay

## 2016-04-23 ENCOUNTER — Ambulatory Visit (INDEPENDENT_AMBULATORY_CARE_PROVIDER_SITE_OTHER): Payer: Self-pay | Admitting: Obstetrics & Gynecology

## 2016-04-23 VITALS — BP 110/59 | HR 93 | Wt 172.4 lb

## 2016-04-23 DIAGNOSIS — O09522 Supervision of elderly multigravida, second trimester: Secondary | ICD-10-CM

## 2016-04-23 DIAGNOSIS — O24419 Gestational diabetes mellitus in pregnancy, unspecified control: Secondary | ICD-10-CM

## 2016-04-23 DIAGNOSIS — Z3689 Encounter for other specified antenatal screening: Secondary | ICD-10-CM

## 2016-04-23 DIAGNOSIS — O09523 Supervision of elderly multigravida, third trimester: Secondary | ICD-10-CM

## 2016-04-23 NOTE — Progress Notes (Addendum)
Pt informed that the ultrasound is considered a limited OB ultrasound and is not intended to be a complete ultrasound exam.  Patient also informed that the ultrasound is not being completed with the intent of assessing for fetal or placental anomalies or any pelvic abnormalities.  Explained that the purpose of today's ultrasound is to assess for presentation and amniotic fluid volume.  Patient acknowledges the purpose of the exam and the limitations of the study.    Version not done on 1/31.  Pt scheduled as Direct Admit for Version and IOL on 2/10 @ 0800.

## 2016-04-23 NOTE — Progress Notes (Signed)
   PRENATAL VISIT NOTE  Subjective:  Aimee Benjamin is a 36 y.o. [id number] at 6558w3d being seen today for ongoing prenatal care.  She is currently monitored for the following issues for this high-risk pregnancy and has Supervision of high risk elderly multigravida in second trimester; AMA (advanced maternal age) multigravida 35+; History of cervical incompetence in pregnancy, currently pregnant; GDM, class A2; BMI 32.0-32.9,adult; Late prenatal care affecting pregnancy in second trimester; Language barrier; GBS + with PCN allergy, clinda sensitive; Transverse lie of fetus; Malpresentation before onset of labor; and Unstable lie of fetus on her problem list.  Patient reports no complaints.  Contractions: Irregular. Vag. Bleeding: None.  Movement: Present. Denies leaking of fluid.   The following portions of the patient's history were reviewed and updated as appropriate: allergies, current medications, past family history, past medical history, past social history, past surgical history and problem list. Problem list updated.  Objective:   Vitals:   04/23/16 1302  BP: (!) 110/59  Pulse: 93  Weight: 172 lb 6.4 oz (78.2 kg)    Fetal Status: Fetal Heart Rate (bpm): NST   Movement: Present  Presentation: Transverse (head on maternal Lt, spine up)  General:  Alert, oriented and cooperative. Patient is in no acute distress.  Skin: Skin is warm and dry. No rash noted.   Cardiovascular: Normal heart rate noted  Respiratory: Normal respiratory effort, no problems with respiration noted  Abdomen: Soft, gravid, appropriate for gestational age. Pain/Pressure: Present     Pelvic:  Cervical exam deferred        Extremities: Normal range of motion.     Mental Status: Normal mood and affect. Normal behavior. Normal judgment and thought content.   Assessment and Plan:  Pregnancy: [id number] at 6858w3d  1. GDM, class A2 Transverse lie - Fetal nonstress test - US OB Limited  2. Supervision of high risk  elderly multigravida in second trimester   3. Elderly multigravida in third trimester   Term labor symptoms and general obstetric precautions including but not limited to vaginal bleeding, contractions, leaking of fluid and fetal movement were reviewed in detail with the patient. Please refer to After Visit Summary for other counseling recommendations.  Return in about 3 days (around 04/26/2016) for NST only. ECV and IOL 39.1 wk  Adam PhenixJames G Breeley Bischof, MD

## 2016-04-26 ENCOUNTER — Ambulatory Visit (INDEPENDENT_AMBULATORY_CARE_PROVIDER_SITE_OTHER): Payer: Self-pay | Admitting: Obstetrics & Gynecology

## 2016-04-26 VITALS — BP 107/54 | HR 83

## 2016-04-26 DIAGNOSIS — O24419 Gestational diabetes mellitus in pregnancy, unspecified control: Secondary | ICD-10-CM

## 2016-04-26 NOTE — Progress Notes (Signed)
[employer] interpreter # [phone] used for encounter. Pt is scheduled as a Direct Admit for Version and IOL on 2/10 @ 0800.  Pre-admission instructions given including no food after midnight, only water and do not take am glyburide on 2/10.

## 2016-04-28 ENCOUNTER — Inpatient Hospital Stay (HOSPITAL_COMMUNITY): Payer: Medicaid Other | Admitting: Anesthesiology

## 2016-04-28 ENCOUNTER — Encounter (HOSPITAL_COMMUNITY): Payer: Self-pay

## 2016-04-28 ENCOUNTER — Inpatient Hospital Stay (HOSPITAL_COMMUNITY)
Admission: AD | Admit: 2016-04-28 | Discharge: 2016-04-30 | DRG: 775 | Disposition: A | Discharge status: Home / Self Care | Payer: Medicaid Other | Source: Ambulatory Visit | Attending: Obstetrics & Gynecology | Admitting: Obstetrics & Gynecology

## 2016-04-28 DIAGNOSIS — Z833 Family history of diabetes mellitus: Secondary | ICD-10-CM | POA: Diagnosis not present

## 2016-04-28 DIAGNOSIS — O99824 Streptococcus B carrier state complicating childbirth: Secondary | ICD-10-CM | POA: Diagnosis not present

## 2016-04-28 DIAGNOSIS — Z3A39 39 weeks gestation of pregnancy: Secondary | ICD-10-CM

## 2016-04-28 DIAGNOSIS — O24419 Gestational diabetes mellitus in pregnancy, unspecified control: Secondary | ICD-10-CM

## 2016-04-28 DIAGNOSIS — Z880 Allergy status to penicillin: Secondary | ICD-10-CM

## 2016-04-28 DIAGNOSIS — O24425 Gestational diabetes mellitus in childbirth, controlled by oral hypoglycemic drugs: Secondary | ICD-10-CM | POA: Diagnosis present

## 2016-04-28 DIAGNOSIS — O3433 Maternal care for cervical incompetence, third trimester: Secondary | ICD-10-CM | POA: Diagnosis present

## 2016-04-28 DIAGNOSIS — O24429 Gestational diabetes mellitus in childbirth, unspecified control: Secondary | ICD-10-CM | POA: Diagnosis not present

## 2016-04-28 DIAGNOSIS — O24415 Gestational diabetes mellitus in pregnancy, controlled by oral hypoglycemic drugs: Secondary | ICD-10-CM | POA: Diagnosis present

## 2016-04-28 HISTORY — DX: Type 2 diabetes mellitus without complications: E11.9

## 2016-04-28 LAB — CBC
HCT: 34.8 % — ABNORMAL LOW (ref 36.0–46.0)
Hemoglobin: 11.5 g/dL — ABNORMAL LOW (ref 12.0–15.0)
MCH: 26.7 pg (ref 26.0–34.0)
MCHC: 33 g/dL (ref 30.0–36.0)
MCV: 80.7 fL (ref 78.0–100.0)
PLATELETS: 176 10*3/uL (ref 150–400)
RBC: 4.31 MIL/uL (ref 3.87–5.11)
RDW: 16 % — AB (ref 11.5–15.5)
WBC: 6.7 10*3/uL (ref 4.0–10.5)

## 2016-04-28 LAB — TYPE AND SCREEN
ABO/RH(D): O POS
Antibody Screen: NEGATIVE

## 2016-04-28 LAB — GLUCOSE, CAPILLARY
Glucose-Capillary: 69 mg/dL (ref 65–99)
Glucose-Capillary: 74 mg/dL (ref 65–99)

## 2016-04-28 MED ORDER — SODIUM CHLORIDE 0.9% FLUSH: 3.0000 mL | INTRAVENOUS | Status: DC | PRN

## 2016-04-28 MED ORDER — ONDANSETRON HCL 4 MG PO TABS: 4.0000 mg | ORAL_TABLET | ORAL | Status: DC | PRN

## 2016-04-28 MED ORDER — TETANUS-DIPHTH-ACELL PERTUSSIS 5-2.5-18.5 LF-MCG/0.5 IM SUSP: 0.5000 mL | Freq: Once | INTRAMUSCULAR | Status: DC

## 2016-04-28 MED ORDER — SOD CITRATE-CITRIC ACID 500-334 MG/5ML PO SOLN: 30.0000 mL | ORAL | Status: DC | PRN

## 2016-04-28 MED ORDER — CLINDAMYCIN PHOSPHATE 900 MG/50ML IV SOLN
900.0000 mg | Freq: Three times a day (TID) | INTRAVENOUS | Status: DC
  Administered 2016-04-28: 900 mg via INTRAVENOUS
  Filled 2016-04-28 (×2): qty 50

## 2016-04-28 MED ORDER — LACTATED RINGERS IV SOLN: 500.0000 mL | Freq: Once | INTRAVENOUS | Status: DC

## 2016-04-28 MED ORDER — MISOPROSTOL 200 MCG PO TABS
800.0000 ug | ORAL_TABLET | Freq: Once | ORAL | Status: AC
  Administered 2016-04-28: 800 ug via RECTAL

## 2016-04-28 MED ORDER — DIPHENHYDRAMINE HCL 25 MG PO CAPS: 25.0000 mg | ORAL_CAPSULE | Freq: Four times a day (QID) | ORAL | Status: DC | PRN

## 2016-04-28 MED ORDER — ACETAMINOPHEN 325 MG PO TABS
650.0000 mg | ORAL_TABLET | ORAL | Status: DC | PRN
Start: 2016-04-28 — End: 2016-04-30
  Administered 2016-04-28 – 2016-04-30 (×6): 650 mg via ORAL
  Filled 2016-04-28 (×7): qty 2

## 2016-04-28 MED ORDER — OXYTOCIN BOLUS FROM INFUSION
500.0000 mL | Freq: Once | INTRAVENOUS | Status: AC
  Administered 2016-04-28: 500 mL via INTRAVENOUS

## 2016-04-28 MED ORDER — LIDOCAINE HCL (PF) 1 % IJ SOLN
30.0000 mL | INTRAMUSCULAR | Status: DC | PRN
  Filled 2016-04-28: qty 30

## 2016-04-28 MED ORDER — COCONUT OIL OIL: 1.0000 "application " | TOPICAL_OIL | Status: DC | PRN

## 2016-04-28 MED ORDER — TERBUTALINE SULFATE 1 MG/ML IJ SOLN
0.2500 mg | Freq: Once | INTRAMUSCULAR | Status: AC | PRN
  Administered 2016-04-28: 0.25 mg via SUBCUTANEOUS
  Filled 2016-04-28: qty 1

## 2016-04-28 MED ORDER — OXYCODONE-ACETAMINOPHEN 5-325 MG PO TABS: 1.0000 | ORAL_TABLET | ORAL | Status: DC | PRN

## 2016-04-28 MED ORDER — LACTATED RINGERS IV SOLN: 500.0000 mL | INTRAVENOUS | Status: DC | PRN

## 2016-04-28 MED ORDER — MISOPROSTOL 25 MCG QUARTER TABLET
25.0000 ug | ORAL_TABLET | ORAL | Status: DC | PRN
  Administered 2016-04-28: 25 ug via VAGINAL
  Filled 2016-04-28: qty 0.25
  Filled 2016-04-28: qty 1

## 2016-04-28 MED ORDER — EPHEDRINE 5 MG/ML INJ: 10.0000 mg | INTRAVENOUS | Status: DC | PRN

## 2016-04-28 MED ORDER — OXYTOCIN 40 UNITS IN LACTATED RINGERS INFUSION - SIMPLE MED
1.0000 m[IU]/min | INTRAVENOUS | Status: DC
Start: 2016-04-28 — End: 2016-04-28
  Administered 2016-04-28: 2 m[IU]/min via INTRAVENOUS

## 2016-04-28 MED ORDER — OXYCODONE HCL 5 MG PO TABS
10.0000 mg | ORAL_TABLET | ORAL | Status: DC | PRN
  Administered 2016-04-29: 10 mg via ORAL
  Filled 2016-04-28: qty 2

## 2016-04-28 MED ORDER — PRENATAL MULTIVITAMIN CH
1.0000 | ORAL_TABLET | Freq: Every day | ORAL | Status: DC
  Administered 2016-04-29: 1 via ORAL
  Filled 2016-04-28: qty 1

## 2016-04-28 MED ORDER — PHENYLEPHRINE 40 MCG/ML (10ML) SYRINGE FOR IV PUSH (FOR BLOOD PRESSURE SUPPORT): 80.0000 ug | PREFILLED_SYRINGE | INTRAVENOUS | Status: DC | PRN

## 2016-04-28 MED ORDER — MISOPROSTOL 200 MCG PO TABS
ORAL_TABLET | ORAL | Status: AC
  Administered 2016-04-28: 800 ug via RECTAL
  Filled 2016-04-28: qty 4

## 2016-04-28 MED ORDER — SODIUM CHLORIDE 0.9% FLUSH: 3.0000 mL | Freq: Two times a day (BID) | INTRAVENOUS | Status: DC

## 2016-04-28 MED ORDER — IBUPROFEN 600 MG PO TABS
600.0000 mg | ORAL_TABLET | Freq: Four times a day (QID) | ORAL | Status: DC
  Administered 2016-04-29: 600 mg via ORAL
  Filled 2016-04-28: qty 1

## 2016-04-28 MED ORDER — ACETAMINOPHEN 325 MG PO TABS: 650.0000 mg | ORAL_TABLET | ORAL | Status: DC | PRN

## 2016-04-28 MED ORDER — DIPHENHYDRAMINE HCL 50 MG/ML IJ SOLN: 12.5000 mg | INTRAMUSCULAR | Status: DC | PRN

## 2016-04-28 MED ORDER — FENTANYL CITRATE (PF) 100 MCG/2ML IJ SOLN: 50.0000 ug | INTRAMUSCULAR | Status: DC | PRN

## 2016-04-28 MED ORDER — ONDANSETRON HCL 4 MG/2ML IJ SOLN: 4.0000 mg | INTRAMUSCULAR | Status: DC | PRN

## 2016-04-28 MED ORDER — TERBUTALINE SULFATE 1 MG/ML IJ SOLN
0.2500 mg | Freq: Once | INTRAMUSCULAR | Status: DC | PRN
  Filled 2016-04-28: qty 1

## 2016-04-28 MED ORDER — OXYTOCIN 40 UNITS IN LACTATED RINGERS INFUSION - SIMPLE MED: 2.5000 [IU]/h | INTRAVENOUS | Status: DC | PRN

## 2016-04-28 MED ORDER — EPHEDRINE 5 MG/ML INJ
10.0000 mg | INTRAVENOUS | Status: DC | PRN
  Filled 2016-04-28: qty 4

## 2016-04-28 MED ORDER — PHENYLEPHRINE 40 MCG/ML (10ML) SYRINGE FOR IV PUSH (FOR BLOOD PRESSURE SUPPORT)
80.0000 ug | PREFILLED_SYRINGE | INTRAVENOUS | Status: DC | PRN
  Filled 2016-04-28: qty 10
  Filled 2016-04-28: qty 5

## 2016-04-28 MED ORDER — OXYCODONE-ACETAMINOPHEN 5-325 MG PO TABS: 2.0000 | ORAL_TABLET | ORAL | Status: DC | PRN

## 2016-04-28 MED ORDER — SODIUM CHLORIDE 0.9 % IV SOLN: 250.0000 mL | INTRAVENOUS | Status: DC | PRN

## 2016-04-28 MED ORDER — OXYCODONE HCL 5 MG PO TABS
5.0000 mg | ORAL_TABLET | ORAL | Status: DC | PRN
  Administered 2016-04-29 (×2): 5 mg via ORAL
  Filled 2016-04-28 (×2): qty 1

## 2016-04-28 MED ORDER — SIMETHICONE 80 MG PO CHEW
80.0000 mg | CHEWABLE_TABLET | ORAL | Status: DC | PRN
Start: 2016-04-28 — End: 2016-04-30

## 2016-04-28 MED ORDER — OXYTOCIN 40 UNITS IN LACTATED RINGERS INFUSION - SIMPLE MED
2.5000 [IU]/h | INTRAVENOUS | Status: DC
Start: 2016-04-28 — End: 2016-04-28
  Administered 2016-04-28: 2.5 [IU]/h via INTRAVENOUS
  Filled 2016-04-28: qty 1000

## 2016-04-28 MED ORDER — DIBUCAINE 1 % RE OINT: 1.0000 "application " | TOPICAL_OINTMENT | RECTAL | Status: DC | PRN

## 2016-04-28 MED ORDER — SENNOSIDES-DOCUSATE SODIUM 8.6-50 MG PO TABS
2.0000 | ORAL_TABLET | ORAL | Status: DC
  Administered 2016-04-29 – 2016-04-30 (×2): 2 via ORAL
  Filled 2016-04-28 (×2): qty 2

## 2016-04-28 MED ORDER — FLEET ENEMA 7-19 GM/118ML RE ENEM: 1.0000 | ENEMA | RECTAL | Status: DC | PRN

## 2016-04-28 MED ORDER — WITCH HAZEL-GLYCERIN EX PADS: 1.0000 "application " | MEDICATED_PAD | CUTANEOUS | Status: DC | PRN

## 2016-04-28 MED ORDER — FENTANYL 2.5 MCG/ML BUPIVACAINE 1/10 % EPIDURAL INFUSION (WH - ANES): 14.0000 mL/h | INTRAMUSCULAR | Status: DC | PRN

## 2016-04-28 MED ORDER — ZOLPIDEM TARTRATE 5 MG PO TABS: 5.0000 mg | ORAL_TABLET | Freq: Every evening | ORAL | Status: DC | PRN

## 2016-04-28 MED ORDER — ONDANSETRON HCL 4 MG/2ML IJ SOLN: 4.0000 mg | Freq: Four times a day (QID) | INTRAMUSCULAR | Status: DC | PRN

## 2016-04-28 MED ORDER — FENTANYL 2.5 MCG/ML BUPIVACAINE 1/10 % EPIDURAL INFUSION (WH - ANES)
14.0000 mL/h | INTRAMUSCULAR | Status: DC | PRN
  Administered 2016-04-28: 14 mL/h via EPIDURAL
  Filled 2016-04-28: qty 100

## 2016-04-28 MED ORDER — PHENYLEPHRINE 40 MCG/ML (10ML) SYRINGE FOR IV PUSH (FOR BLOOD PRESSURE SUPPORT)
80.0000 ug | PREFILLED_SYRINGE | INTRAVENOUS | Status: DC | PRN
  Filled 2016-04-28: qty 5

## 2016-04-28 MED ORDER — LIDOCAINE HCL (PF) 1 % IJ SOLN
INTRAMUSCULAR | Status: DC | PRN
  Administered 2016-04-28 (×2): 5 mL via EPIDURAL

## 2016-04-28 MED ORDER — BENZOCAINE-MENTHOL 20-0.5 % EX AERO: 1.0000 "application " | INHALATION_SPRAY | CUTANEOUS | Status: DC | PRN

## 2016-04-28 MED ORDER — LACTATED RINGERS IV SOLN
INTRAVENOUS | Status: DC
  Administered 2016-04-28: 09:00:00 via INTRAVENOUS

## 2016-04-28 NOTE — Progress Notes (Signed)
Dr Omer JackMumaw able to remove vaginal cytotec and discussing version r/b/a with possible need for c/s delivery.  Pt states understanding and agreement via interpretor Marta.

## 2016-04-28 NOTE — Progress Notes (Signed)
Patient ID: [name], female   DOB: 02/16/1981, 36 y.o.   MRN: [id number]  PROCEDURE: External Cephalic Version  After informed verbal and written consent, Terbutaline 0.25 mg SQ given, ECV was attempted under Ultrasound guidance. Forward summersault to mother's right, two attempts, successful. Abdominal binder placed. Pitocin started.  FHR was reactive before and after the procedure.    Patient tolerated the procedure well.  O:  Vitals:   04/28/16 0900 04/28/16 1022 04/28/16 1159 04/28/16 1228  BP: 119/75 (!) 114/56 (!) 114/58   Pulse: 98 89 85   Resp: 18 18 16 18   Temp: 98.6 F (37 C)  98.8 F (37.1 C)   TempSrc: Oral  Oral   Weight: 172 lb (78 kg)     Height: 5' (1.524 m)       Dilation: 4 Effacement (%): 60 Cervical Position: Posterior Station: -3 Presentation: Vertex (version successful) Exam by:: Dr. Penne LashLeggett   FHT: 130 bpm, mod var, +accels, no decels TOCO: Occasional, irregular  A/P: Start pitocin Continue expectant management Anticipate SVD

## 2016-04-28 NOTE — Anesthesia Procedure Notes (Signed)
Epidural Patient location during procedure: OB Start time: 04/28/2016 3:27 PM End time: 04/28/2016 3:37 PM  Staffing Anesthesiologist: Chaney MallingHODIERNE, Miana Politte Performed: anesthesiologist   Preanesthetic Checklist Completed: patient identified, site marked, pre-op evaluation, timeout performed, IV checked, risks and benefits discussed and monitors and equipment checked  Epidural Patient position: sitting Prep: DuraPrep Patient monitoring: heart rate, cardiac monitor, continuous pulse ox and blood pressure Approach: midline Location: L2-L3 Injection technique: LOR saline  Needle:  Needle type: Tuohy  Needle gauge: 17 G Needle length: 9 cm Needle insertion depth: 5 cm Catheter type: closed end flexible Catheter size: 19 Gauge Catheter at skin depth: 9 cm Test dose: negative and Other  Assessment Events: blood not aspirated, injection not painful, no injection resistance and negative IV test  Additional Notes Informed consent obtained prior to proceeding including risk of failure, 1% risk of PDPH, risk of minor discomfort and bruising.  Discussed rare but serious complications including epidural abscess, permanent nerve injury, epidural hematoma.  Discussed alternatives to epidural analgesia and patient desires to proceed.  Timeout performed pre-procedure verifying patient name, procedure, and platelet count.  Patient tolerated procedure well. Reason for block:procedure for pain

## 2016-04-28 NOTE — Anesthesia Pain Management Evaluation Note (Signed)
  CRNA Pain Management Visit Note  Patient: Aimee Benjamin, 36 y.o., female  "Hello [facility]. We have an anesthesia team available at all times to provide care throughout the hospital, including epidural management and anesthesia for C-section. I don't know your plan for the delivery whether it a natural birth, water birth, IV sedation, nitrous supplementation, doula or epidural, but we want to meet your pain goals."   1.Was your pain managed to your expectations on prior hospitalizations?   Yes   2.What is your expectation for pain management during this hospitalization?     Epidural  3.How can we help you reach that goal? Support prn  Record the patient's initial score and the patient's pain goal.   Pain: 10  Pain Goal: 3   Pt was interviewed at 1455.  Pt in pain and ready for epidural.  Bedside RN called MDA for epidural placement.  Pain round entered at 1626.   [facility] wants you to be able to say your pain was always managed very well.  Jeff[facility] Strat[facility] HospitalWRINKLE,Aimee Benjamin 04/28/2016

## 2016-04-28 NOTE — H&P (Signed)
LABOR AND DELIVERY ADMISSION HISTORY AND PHYSICAL NOTE  Aimee Benjamin is a 36 y.o. female [phone] with IUP at [redacted]w[redacted]d by 8 wk [place] presenting for IOL for GDM A2.   She reports positive fetal movement. She denies leakage of fluid or vaginal bleeding.  Prenatal History/Complications:  Past Medical History: Past Medical History:  Diagnosis Date  . Diabetes mellitus without complication (HCC)   . Incompetence of cervix   . Preterm labor     Past Surgical History: Past Surgical History:  Procedure Laterality Date  . CERVICAL CERCLAGE N/A 08/27/2012   Procedure: CERCLAGE CERVICAL;  Surgeon: [name], MD;  Location: [facility];  Service: Gynecology;  Laterality: N/A;  . CERVICAL CERCLAGE N/A 02/09/2014   Procedure: CERCLAGE CERVICAL;  Surgeon: [name], MD;  Location: [facility];  Service: Gynecology;  Laterality: N/A;  . CHOLECYSTECTOMY      Obstetrical History: OB History    Gravida Para Term Preterm AB Living   8 3 2 1 4 2    SAB TAB Ectopic Multiple Live Births   4     0 3      Obstetric Comments   Previously with Dr. Gaynell Face. Mother had a cervicalgia for both previous babies.       Social History: Social History   Social History  . Marital status: Single    Spouse name: N/A  . Number of children: N/A  . Years of education: N/A   Social History Main Topics  . Smoking status: Never Smoker  . Smokeless tobacco: Never Used  . Alcohol use No  . Drug use: No  . Sexual activity: Not Currently    Birth control/ protection: None   Other Topics Concern  . None   Social History Narrative  . None    Family History: Family History  Problem Relation Age of Onset  . Diabetes Mother     Allergies: Allergies  Allergen Reactions  . Aspirin Shortness Of Breath and Anxiety  . Penicillins Shortness Of Breath, Anxiety and Other (See Comments)    Per pts husband pt had no swelling reaction w/this medication just SOB.   Has patient had a PCN reaction causing  immediate rash, facial/tongue/throat swelling, SOB or lightheadedness with hypotension: Yes Has patient had a PCN reaction causing severe rash involving mucus membranes or skin necrosis: No Has patient had a PCN reaction that required hospitalization No Has patient had a PCN reaction occurring within the last 10 years: Yes If all of the above answers are "NO", then may proceed with Cepha    Prescriptions Prior to Admission  Medication Sig Dispense Refill Last Dose  . glyBURIDE (DIABETA) 2.5 MG tablet Take 1.25 mg by mouth 2 (two) times daily with a meal.   04/27/2016 at Unknown time     Review of Systems   All systems reviewed and negative except as stated in HPI  Blood pressure 119/75, pulse 98, temperature 98.6 F (37 C), temperature source Oral, resp. rate 18, height 5' (1.524 m), weight 172 lb (78 kg), last menstrual period 07/13/2015, unknown if currently breastfeeding. General appearance: alert, cooperative, appears stated age and no distress Lungs: clear to auscultation bilaterally Heart: regular rate and rhythm Abdomen: soft, non-tender; bowel sounds normal Extremities: No calf swelling or tenderness Presentation: cephalic Fetal monitoring: 125 bpm, mod var, +accels, no decels Uterine activity: Occasional, >q47min Dilation: 2 Effacement (%): 60 Station: -3 Exam by:: J.Follmer,RNC   Prenatal labs: ABO, Rh: O/POS/-- (08/02 1046) Antibody: NEG (08/02 1046)  Rubella: !Error! RPR: NON REAC (11/27 0855)  HBsAg: NEGATIVE (08/02 1046)  HIV: NONREACTIVE (11/27 0855)  GBS: Positive (01/15 0000)  3 hr Glucola: 115/255/230/158 Genetic screening:  Declined Anatomy US: Normal, EFW @ 1/26 84%, 3348g  Prenatal Transfer Tool  Maternal Diabetes: Yes:  Diabetes Type:  Insulin/Medication controlled Genetic Screening: Declined Maternal Ultrasounds/Referrals: Normal Fetal Ultrasounds or other Referrals:  None Maternal Substance Abuse:  No Significant Maternal Medications:  Meds  include: Other:  glyburide Significant Maternal Lab Results: Lab values include: Group B Strep positive  Results for orders placed or performed during the hospital encounter of 04/28/16 (from the past 24 hour(s))  CBC   Collection Time: 04/28/16  8:35 AM  Result Value Ref Range   WBC 6.7 4.0 - 10.5 K/uL   RBC 4.31 3.87 - 5.11 MIL/uL   Hemoglobin 11.5 (L) 12.0 - 15.0 g/dL   HCT 16.134.8 (L) 09.636.0 - 04.546.0 %   MCV 80.7 78.0 - 100.0 fL   MCH 26.7 26.0 - 34.0 pg   MCHC 33.0 30.0 - 36.0 g/dL   RDW 40.916.0 (H) 81.111.5 - 91.415.5 %   Platelets 176 150 - 400 K/uL  Glucose, capillary   Collection Time: 04/28/16  8:37 AM  Result Value Ref Range   Glucose-Capillary 69 65 - 99 mg/dL    Patient Active Problem List   Diagnosis Date Noted  . Gestational diabetes mellitus (GDM) controlled on oral hypoglycemic drug 04/28/2016  . Malpresentation before onset of labor 04/18/2016  . Unstable lie of fetus 04/18/2016  . Transverse lie of fetus 04/16/2016  . GBS + with PCN allergy, clinda sensitive 04/09/2016  . Language barrier 03/08/2016  . AMA (advanced maternal age) multigravida 35+ 11/28/2015  . History of cervical incompetence in pregnancy, currently pregnant 11/28/2015  . GDM, class A2 11/28/2015  . BMI 32.0-32.9,adult 11/28/2015  . Late prenatal care affecting pregnancy in second trimester 11/28/2015  . Supervision of high risk elderly multigravida in second trimester 10/27/2015    Assessment: Aimee Benjamin is a 36 y.o. [id number] at 4976w1d here for IOL 2/2 GDMA2 (on glyburide)  #Labor: IOL w/ cytotec then pitocin #Pain: IV pain meds prn, epidural on request #FWB: Cat I #ID:  GBS Pos, PCN allergic, Clinda sensitive #MOF: Both #MOC:Nexplanon #Circ:  N/A - girl #GDMA2: Admission FBS 66, CBG q4h, glucostabilizer if CBG >120  Jen MowElizabeth Lunna Vogelgesang, DO OB Fellow [facility] 04/28/2016, 10:02 AM

## 2016-04-28 NOTE — Anesthesia Preprocedure Evaluation (Signed)
Anesthesia Evaluation  Patient identified by MRN, date of birth, ID band Patient awake    Reviewed: Allergy & Precautions, H&P , NPO status , Patient's Chart, lab work & pertinent test results  Airway Mallampati: II   Neck ROM: full    Dental   Pulmonary neg pulmonary ROS,    breath sounds clear to auscultation       Cardiovascular negative cardio ROS   Rhythm:regular Rate:Normal     Neuro/Psych    GI/Hepatic   Endo/Other  diabetes, Type 2  Renal/GU      Musculoskeletal   Abdominal   Peds  Hematology   Anesthesia Other Findings   Reproductive/Obstetrics (+) Pregnancy                             Anesthesia Physical Anesthesia Plan  ASA: II  Anesthesia Plan: Epidural   Post-op Pain Management:    Induction: Intravenous  Airway Management Planned: Natural Airway  Additional Equipment:   Intra-op Plan:   Post-operative Plan:   Informed Consent: I have reviewed the patients History and Physical, chart, labs and discussed the procedure including the risks, benefits and alternatives for the proposed anesthesia with the patient or authorized representative who has indicated his/her understanding and acceptance.     Plan Discussed with: CRNA, Anesthesiologist and Surgeon  Anesthesia Plan Comments:         Anesthesia Quick Evaluation

## 2016-04-28 NOTE — Progress Notes (Signed)
Patient ID: Aimee Benjamin, female   DOB: 01/26/1981, 36 y.o.   MRN: [id number]  S: Patient seen & examined for progress of labor. Patient feeling more uncomfortable with her contractions, requesting an epidural.     O:  Vitals:   04/28/16 1159 04/28/16 1228 04/28/16 1249 04/28/16 1320  BP: (!) 114/58  114/60 (!) 118/55  Pulse: 85  (!) 105 (!) 103  Resp: 16 18 18 16   Temp: 98.8 F (37.1 C)     TempSrc: Oral     Weight:      Height:        Dilation: 4 Effacement (%): 60 Cervical Position: Posterior Station: -3 Presentation: Vertex (version successful) Exam by:: Dr. Penne LashLeggett  Bedside ultrasound confirmed vertex presentation.  AROM performed with clear fluid return. Good fetal head application during contractions.  FHT: 135 bpm, mod var, +accels, no decels TOCO: q4-715min   A/P: Vertex presentation confirmed AROM performed, clear Continue pitocin Epidural to be placed Continue expectant management Anticipate SVD

## 2016-04-28 NOTE — Progress Notes (Signed)
Patient ID: Aimee Benjamin, female   DOB: 07/11/1980, 36 y.o.   MRN: [id number]  S: Patient seen & examined for start of induction of labor. Patient comfortable. 2 minutes ago a cytotec was placed after RN informed patient was cephalic presentation, after evaluating the patient, it was noted with BSUS and vaginal exam that she was transverse lie, head to the left. Cytotec was found vaginally near introitus, was removed fully intact, had been in the vagina for less than 2 minutes total.  Will proceed with external cephalic version. Reevaluate after ECV. Patient understands. Interpretor present and used the entire time.   O:  Vitals:   04/28/16 0900 04/28/16 1022 04/28/16 1159 04/28/16 1228  BP: 119/75 (!) 114/56 (!) 114/58   Pulse: 98 89 85   Resp: 18 18 16 18   Temp: 98.6 F (37 C)  98.8 F (37.1 C)   TempSrc: Oral  Oral   Weight: 172 lb (78 kg)     Height: 5' (1.524 m)        SVE: 2/60/-3  FHT: 140 bpm, mod var, +accels, no decels TOCO: Irregular   A/P: Removed cytotec Plan for external cephalic version Continue expectant management Anticipate SVD if successful ECV

## 2016-04-28 NOTE — Progress Notes (Signed)
Rn in & out cath patient. Urine volume was 750 ml. Patient's temperature was 100.4. Orders were give to take temperature within 30 minutes to see if temp was decreasing.

## 2016-04-29 LAB — GLUCOSE, RANDOM: GLUCOSE: 92 mg/dL (ref 65–99)

## 2016-04-29 LAB — RPR: RPR: NONREACTIVE

## 2016-04-29 NOTE — Progress Notes (Signed)
POSTPARTUM PROGRESS NOTE  Post Partum Day 0 Subjective:  Aimee Benjamin is a 36 y.o. [id number] 4165w1d s/p U/S with GDMA2 on glyburide.   No acute events overnight.  Pt denies problems with ambulating, voiding or po intake.  She denies nausea or vomiting.  Pain is poorly controlled despite oxycodone & tylenol regimen. She reports low urine output.  She has not had flatus. She has had bowel movement.  Lochia is appropriate.  Objective: Blood pressure (!) 100/52, pulse 74, temperature 98.5 F (36.9 C), temperature source Oral, resp. rate 18, height 5' (1.524 m), weight 78 kg (172 lb), last menstrual period 07/13/2015, SpO2 100 %, unknown if currently breastfeeding.  Physical Exam:  General: alert, cooperative and no distress Lochia:normal flow Chest: CTAB; Normal work of breathing Heart: RRR, normal s1 & s2,  no m/r/g Abdomen: +BS, soft, RLQ tenderness to palpation  DVT Evaluation: No calf swelling or tenderness Extremities: No edema; +2 dorsalis pedis pulses   Recent Labs  04/28/16 0835  HGB 11.5*  HCT 34.8*    Assessment/Plan:  ASSESSMENT: Aimee Benjamin is a 36 y.o. [id number] 8465w1d s/p U/S with GDMA2 on glyburide Poor urine output despite 750 cc with cath; postvoid bladder scan in PM  RLQ tenderness; Afebile & WBC count wnl;     Plan for discharge tomorrow and Contraception Nexplanon   LOS: 1 day   Collie Siadgar Ogar, Medical Student MS3 [facility], [facility]  04/29/2016, 10:50 AM

## 2016-04-29 NOTE — Progress Notes (Signed)
Patient with tenderness on lower right side of abd, she reports pain of up to a seven Some relief with oxycodone but reports she has never had this type of pain prior to delivery.

## 2016-04-29 NOTE — Anesthesia Postprocedure Evaluation (Signed)
Anesthesia Post Note  Patient: Aimee Benjamin  Procedure(s) Performed: * No procedures listed *  Patient location during evaluation: Mother Baby Anesthesia Type: Epidural Level of consciousness: awake and alert and oriented Pain management: satisfactory to patient Vital Signs Assessment: post-procedure vital signs reviewed and stable Respiratory status: spontaneous breathing and nonlabored ventilation Cardiovascular status: stable Postop Assessment: no headache, no backache, no signs of nausea or vomiting, adequate PO intake and patient able to bend at knees (patient up walking) Anesthetic complications: no        Last Vitals:  Vitals:   04/29/16 0518 04/29/16 0900  BP: (!) 104/58 (!) 100/52  Pulse: 82 74  Resp: 16 18  Temp: 37.1 C 36.9 C    Last Pain:  Vitals:   04/29/16 1239  TempSrc:   PainSc: 9    Pain Goal:                 Madison HickmanGREGORY,Shantrice Rodenberg

## 2016-04-30 DIAGNOSIS — O24429 Gestational diabetes mellitus in childbirth, unspecified control: Secondary | ICD-10-CM

## 2016-04-30 DIAGNOSIS — O99824 Streptococcus B carrier state complicating childbirth: Secondary | ICD-10-CM

## 2016-04-30 MED ORDER — DOCUSATE SODIUM 100 MG PO CAPS: 100.0000 mg | ORAL_CAPSULE | Freq: Two times a day (BID) | ORAL | 0 refills | Status: DC

## 2016-04-30 NOTE — Discharge Summary (Signed)
OB Discharge Summary     Patient Name: Aimee Benjamin DOB: 04/12/1980 MRN: [id number]  Date of admission: 04/28/2016 Delivering MD: Jen MowMUMAW, [facility]   Date of discharge: 04/30/2016  Admitting diagnosis: 3474w1d, Version, Possible Induction  Intrauterine pregnancy: 8138w1d     Secondary diagnosis:  Active Problems:   Gestational diabetes mellitus (GDM) controlled on oral hypoglycemic drug  Additional problems: GDMA2 (glyburide) CBG yesterday AM 92     Discharge diagnosis: Term Pregnancy Delivered                                                                                                Post partum procedures:none  Augmentation: AROM and Pitocin  Complications: None  Hospital course:  Induction of Labor With Vaginal Delivery   36 y.o. yo [id number] at 3638w1d was admitted to the hospital 04/28/2016 for induction of labor.  Indication for induction: A2 DM.  Patient had an uncomplicated labor course as follows: Membrane Rupture Time/Date: 3:09 PM ,04/28/2016   Intrapartum Procedures: Episiotomy: None [1]                                         Lacerations:  None [1]  Patient had delivery of a Viable infant.  Information for the patient's newborn:  Aimee Benjamin, Aimee Benjamin [[id number]]  Delivery Method: Vaginal, Spontaneous Delivery (Filed from Delivery Summary)   04/28/2016  Details of delivery can be found in separate delivery note.  Patient had a routine postpartum course. Patient is discharged home 04/30/16.  Physical exam  Vitals:   04/29/16 0518 04/29/16 0900 04/29/16 1826 04/30/16 0623  BP: (!) 104/58 (!) 100/52 105/60 (!) 98/55  Pulse: 82 74 85 72  Resp: 16 18 17 16   Temp: 98.7 F (37.1 C) 98.5 F (36.9 C) 98.4 F (36.9 C) 98.2 F (36.8 C)  TempSrc: Oral Oral Oral Oral  SpO2:      Weight:      Height:       General: alert and cooperative Lochia: appropriate Uterine Fundus: firm Incision: N/A DVT Evaluation: No evidence of DVT seen on physical  exam. Labs: Lab Results  Component Value Date   WBC 6.7 04/28/2016   HGB 11.5 (L) 04/28/2016   HCT 34.8 (L) 04/28/2016   MCV 80.7 04/28/2016   PLT 176 04/28/2016   CMP Latest Ref Rng & Units 04/29/2016  Glucose 65 - 99 mg/dL 92  BUN 7 - 25 mg/dL -  Creatinine 1.300.50 - 8.651.10 mg/dL -  Sodium 784135 - 696146 mmol/L -  Potassium 3.5 - 5.3 mmol/L -  Chloride 98 - 110 mmol/L -  CO2 20 - 31 mmol/L -  Calcium 8.6 - 10.2 mg/dL -  Total Protein 6.1 - 8.1 g/dL -  Total Bilirubin 0.2 - 1.2 mg/dL -  Alkaline Phos 33 - 295115 U/L -  AST 10 - 30 U/L -  ALT 6 - 29 U/L -    Discharge instruction: per After Visit Summary and "Baby and Me Booklet".  After visit meds:  Allergies as of 04/30/2016      Reactions   Aspirin Shortness Of Breath, Anxiety   But patient can take motrin    Penicillins Shortness Of Breath, Anxiety, Other (See Comments)   Per pts husband pt had no swelling reaction w/this medication just SOB.   Has patient had a PCN reaction causing immediate rash, facial/tongue/throat swelling, SOB or lightheadedness with hypotension: Yes Has patient had a PCN reaction causing severe rash involving mucus membranes or skin necrosis: No Has patient had a PCN reaction that required hospitalization No Has patient had a PCN reaction occurring within the last 10 years: Yes If all of the above answers are "NO", then may proceed with Cepha      Medication List    STOP taking these medications   glyBURIDE 2.5 MG tablet Commonly known as:  DIABETA     TAKE these medications   docusate sodium 100 MG capsule Commonly known as:  COLACE Take 1 capsule (100 mg total) by mouth 2 (two) times daily.       Diet: routine diet  Activity: Advance as tolerated. Pelvic rest for 6 weeks.   Outpatient follow up:6 weeks Follow up Appt: Future Appointments Date Time Provider Department Center  06/05/2016 1:40 PM Donette Larry, CNM [facility]   Follow up Visit:No Follow-up on file.  Postpartum  contraception: Nexplanon  Newborn Data: Live born female  Birth Weight: 7 lb 9.4 oz (3442 g) APGAR: 8, 9  Baby Feeding: Bottle and Breast Disposition:home with mother   04/30/2016 Renne Musca, MD   CNM attestation:  I have seen and examined this patient; I agree with above documentation in the resident's note.   Aimee Benjamin is a 36 y.o. [id number] reporting no complaint, up ad lib, tolerating PO, +flatus/BM, voiding without difficulty.  PE: BP (!) 98/55 (BP Location: Left Arm)   Pulse 72   Temp 98.2 F (36.8 C) (Oral)   Resp 16   Ht 5' (1.524 m)   Wt 172 lb (78 kg)   LMP 07/13/2015 (Exact Date)   SpO2 100%   Breastfeeding? Unknown   BMI 33.59 kg/m  Gen: calm comfortable, NAD Resp: normal effort, no distress Abd: soft/NT/Fundus firm  ROS, labs, PMH reviewed   Plan: DC home today 6 week FU Nexplanon for birth control at [facility] visit  Mathews Robinsons, Franco Collet, CNM 10:40 AM

## 2016-04-30 NOTE — Discharge Instructions (Signed)

## 2016-06-05 ENCOUNTER — Ambulatory Visit: Payer: Self-pay | Admitting: Advanced Practice Midwife

## 2016-06-18 ENCOUNTER — Ambulatory Visit (INDEPENDENT_AMBULATORY_CARE_PROVIDER_SITE_OTHER): Payer: Self-pay | Admitting: Student

## 2016-06-18 ENCOUNTER — Encounter: Payer: Self-pay | Admitting: Student

## 2016-06-18 DIAGNOSIS — Z30011 Encounter for initial prescription of contraceptive pills: Secondary | ICD-10-CM

## 2016-06-18 DIAGNOSIS — Z789 Other specified health status: Secondary | ICD-10-CM

## 2016-06-18 MED ORDER — NORGESTIMATE-ETH ESTRADIOL 0.25-35 MG-MCG PO TABS: 1.0000 | ORAL_TABLET | Freq: Every day | ORAL | 11 refills | Status: DC

## 2016-06-18 NOTE — Patient Instructions (Signed)
Mantenimiento de la salud - Mujeres (Health Maintenance, Female) Un estilo de vida saludable y los cuidados preventivos pueden favorecer considerablemente a la salud y el bienestar. Pregunte a su mdico cul es el cronograma de exmenes peridicos apropiado para usted. Esta es una buena oportunidad para consultarlo sobre cmo prevenir enfermedades y mantenerse sano. Adems de los controles, hay muchas otras cosas que puede hacer usted mismo. Los expertos han realizado numerosas investigaciones sobre los cambios en el estilo de vida y las medidas de prevencin que, muy probablemente, lo ayudarn a mantenerse sano. Solicite a su mdico ms informacin. EL PESO Y LA DIETA Consuma una dieta saludable.   Asegrese de incluir muchas verduras, frutas, productos lcteos de bajo contenido de grasa y protenas magras.  No consuma muchos alimentos de alto contenido de grasas slidas, azcares agregados o sal.  Realice actividad fsica con regularidad. Esta es una de las prcticas ms importantes que puede hacer por su salud.  La mayora de los adultos deben hacer ejercicio durante al menos 150minutos por semana. El ejercicio debe aumentar la frecuencia cardaca y provocar la transpiracin (ejercicio de intensidad moderada).  La mayora de los adultos tambin deben hacer ejercicios de elongacin al menos dos veces a la semana. Agregue esto al su plan de ejercicio de intensidad moderada. Mantenga un peso saludable.   El ndice de masa corporal (IMC) es una medida que puede utilizarse para identificar posibles problemas de peso. Proporciona una estimacin de la grasa corporal basndose en el peso y la altura. Su mdico puede ayudarle a determinar su IMC y a lograr o mantener un peso saludable.  Para las mujeres de 20aos o ms:  Un IMC menor de 18,5 se considera bajo peso.  Un IMC entre 18,5 y 24,9 es normal.  Un IMC entre 25 y 29,9 se considera sobrepeso.  Un IMC de 30 o ms se considera  obesidad. Observe los niveles de colesterol y lpidos en la sangre.   Debe comenzar a realizarse anlisis de lpidos y colesterol en la sangre a los 20aos y luego repetirlos cada 5aos.  Es posible que necesite controlar los niveles de colesterol con mayor frecuencia si:  Sus niveles de lpidos y colesterol son altos.  Es mayor de 50aos.  Presenta un alto riesgo de padecer enfermedades cardacas. DETECCIN DE CNCER Cncer de pulmn   Se recomienda realizar exmenes de deteccin de cncer de pulmn a personas adultas entre 55 y 80 aos que estn en riesgo de desarrollar cncer de pulmn por sus antecedentes de consumo de tabaco.  Se recomienda una tomografa computarizada de baja dosis de los pulmones todos los aos a las personas que:  Fuman actualmente.  Hayan dejado el hbito en algn momento en los ltimos 15aos.  Hayan fumado durante 30aos un paquete diario. Un paquete-ao equivale a fumar un promedio de un paquete de cigarrillos diario durante un ao.  Los exmenes de deteccin anuales deben continuar hasta que hayan pasado 15aos desde que dej de fumar.  Ya no debern realizarse si tiene un problema de salud que le impida recibir tratamiento para el cncer de pulmn. Cncer de mama   Practique la autoconciencia de la mama. Esto significa reconocer la apariencia normal de sus mamas y cmo las siente.  Tambin significa realizar autoexmenes regulares de las mamas. Informe a su mdico sobre cualquier cambio, sin importar cun pequeo sea.  Si tiene entre 20 y 30 aos, un mdico debe realizarle un examen clnico de las mamas como parte del examen regular   de salud, cada 1 a 3aos.  Si tiene 40aos o ms, debe realizarse un examen clnico de las mamas todos los aos. Tambin considere realizarse una radiografa de las mamas (mamografa) todos los aos.  Si tiene antecedentes familiares de cncer de mama, hable con su mdico para someterse a un estudio  gentico.  Si tiene alto riesgo de padecer cncer de mama, hable con su mdico para someterse a una resonancia magntica y una mamografa todos los aos.  La evaluacin del gen del cncer de mama (BRCA) se recomienda a mujeres que tengan familiares con cnceres relacionados con el BRCA. Los cnceres relacionados con el BRCA incluyen los siguientes:  Mama.  Ovario.  Trompas.  Cnceres de peritoneo.  Los resultados de la evaluacin determinarn la necesidad de asesoramiento gentico y de anlisis de BRCA1 y BRCA2. Cncer de cuello del tero  El mdico puede recomendarle que se haga pruebas peridicas de deteccin de cncer de los rganos de la pelvis (ovarios, tero y vagina). Estas pruebas incluyen un examen plvico, que abarca controlar si se produjeron cambios microscpicos en la superficie del cuello del tero (prueba de Papanicolaou). Pueden recomendarle que se haga estas pruebas cada 3aos, a partir de los 21aos.  A las mujeres que tienen entre 30 y 65aos, los mdicos pueden recomendarles que se sometan a exmenes plvicos y pruebas de Papanicolaou cada 3aos, o a la prueba de Papanicolaou y el examen plvico en combinacin con estudios de deteccin del virus del papiloma humano (VPH) cada 5aos. Algunos tipos de VPH aumentan el riesgo de padecer cncer de cuello del tero. La prueba para la deteccin del VPH tambin puede realizarse a mujeres de cualquier edad cuyos resultados de la prueba de Papanicolaou no sean claros.  Es posible que otros mdicos no recomienden exmenes de deteccin a mujeres no embarazadas que se consideran sujetos de bajo riesgo de padecer cncer de pelvis y que no tienen sntomas. Pregntele al mdico si un examen plvico de deteccin es adecuado para usted.  Si ha recibido un tratamiento para el cncer cervical o una enfermedad que podra causar cncer, necesitar realizarse una prueba de Papanicolaou y controles durante al menos 20 aos de concluido el  tratamiento. Si no se ha hecho el Papanicolaou con regularidad, debern volver a evaluarse los factores de riesgo (como tener un nuevo compaero sexual), para determinar si debe realizarse los estudios nuevamente. Algunas mujeres sufren problemas mdicos que aumentan la probabilidad de contraer cncer de cuello del tero. En estos casos, el mdico podr indicar que se realicen controles y pruebas de Papanicolaou con ms frecuencia. Cncer colorrectal   Este tipo de cncer puede detectarse y a menudo prevenirse.  Por lo general, los estudios de rutina se deben comenzar a hacer a partir de los 50 aos y hasta los 75 aos.  Sin embargo, el mdico podr aconsejarle que lo haga antes, si tiene factores de riesgo para el cncer de colon.  Tambin puede recomendarle que use un kit de prueba para hallar sangre oculta en la materia fecal.  Es posible que se use una pequea cmara en el extremo de un tubo para examinar directamente el colon (sigmoidoscopia o colonoscopia) a fin de detectar formas tempranas de cncer colorrectal.  Los exmenes de rutina generalmente comienzan a los 50aos.  El examen directo del colon se debe repetir cada 5 a 10aos hasta los 75aos. Sin embargo, es posible que se realicen exmenes con mayor frecuencia, si se detectan formas tempranas de plipos precancerosos o pequeos bultos.   Cncer de piel   Revise la piel de la cabeza a los pies con regularidad.  Informe a su mdico si aparecen nuevos lunares o los que tiene se modifican, especialmente en su forma y color.  Tambin notifique al mdico si tiene un lunar que es ms grande que el tamao de una goma de lpiz.  Siempre use pantalla solar. Aplique pantalla solar de manera libre y repetida a lo largo del da.  Protjase usando mangas y pantalones largos, un sombrero de ala ancha y gafas para el sol, siempre que se encuentre en el exterior. ENFERMEDADES CARDACAS, DIABETES E HIPERTENSIN ARTERIAL  La hipertensin  arterial causa enfermedades cardacas y aumenta el riesgo de ictus. La hipertensin arterial es ms probable en los siguientes casos:  Las personas que tienen la presin arterial en el extremo del rango normal (100-139/85-89 mm Hg).  Las personas con sobrepeso u obesidad.  Las personas afroamericanas.  Si usted tiene entre 18 y 39 aos, debe medirse la presin arterial cada 3 a 5 aos. Si usted tiene 40 aos o ms, debe medirse la presin arterial todos los aos. Debe medirse la presin arterial dos veces: una vez cuando est en un hospital o una clnica y la otra vez cuando est en otro sitio. Registre el promedio de las dos mediciones. Para controlar su presin arterial cuando no est en un hospital o una clnica, puede usar lo siguiente:  Una mquina automtica para medir la presin arterial en una farmacia.  Un monitor para medir la presin arterial en el hogar.  Si tiene entre 55 y 79 aos, consulte a su mdico si debe tomar aspirina para prevenir el ictus.  Realcese exmenes de deteccin de la diabetes con regularidad. Esto incluye la toma de una muestra de sangre para controlar el nivel de azcar en la sangre durante el ayuno.  Si tiene un peso normal y un bajo riesgo de padecer diabetes, realcese este anlisis cada tres aos despus de los 45aos.  Si tiene sobrepeso y un alto riesgo de padecer diabetes, considere someterse a este anlisis antes o con mayor frecuencia. PREVENCIN DE INFECCIONES HepatitisB   Si tiene un riesgo ms alto de contraer hepatitis B, debe someterse a un examen de deteccin de este virus. Se considera que tiene un alto riesgo de contraer hepatitis B si:  Naci en un pas donde la hepatitis B es frecuente. Pregntele a su mdico qu pases son considerados de alto riesgo.  Sus padres nacieron en un pas de alto riesgo y usted no recibi una vacuna que lo proteja contra la hepatitis B (vacuna contra la hepatitis B).  Tiene VIH o sida.  Usa agujas  para inyectarse drogas.  Vive con alguien que tiene hepatitis B.  Ha tenido sexo con alguien que tiene hepatitis B.  Recibe tratamiento de hemodilisis.  Toma ciertos medicamentos para el cncer, trasplante de rganos y afecciones autoinmunitarias. Hepatitis C   Se recomienda un anlisis de sangre para:  Todos los que nacieron entre 1945 y 1965.  Todas las personas que tengan un riesgo de haber contrado hepatitis C. Enfermedades de transmisin sexual (ETS).   Debe realizarse pruebas de deteccin de enfermedades de transmisin sexual (ETS), incluidas gonorrea y clamidia si:  Es sexualmente activo y es menor de 24aos.  Es mayor de 24aos, y el mdico le informa que corre riesgo de tener este tipo de infecciones.  La actividad sexual ha cambiado desde que le hicieron la ltima prueba de deteccin y tiene un riesgo   mayor de tener clamidia o gonorrea. Pregntele al mdico si usted tiene riesgo.  Si no tiene el VIH, pero corre riesgo de infectarse por el virus, se recomienda tomar diariamente un medicamento recetado para evitar la infeccin. Esto se conoce como profilaxis previa a la exposicin. Se considera que est en riesgo si:  Es activo sexualmente y no usa preservativos habitualmente o no conoce el estado del VIH de sus parejas sexuales.  Se inyecta drogas.  Es activo sexualmente con una pareja que tiene VIH. Consulte a su mdico para saber si tiene un alto riesgo de infectarse por el VIH. Si opta por comenzar la profilaxis previa a la exposicin, primero debe realizarse anlisis de deteccin del VIH. Luego, le harn anlisis cada 3meses mientras est tomando los medicamentos para la profilaxis previa a la exposicin. EMBARAZO  Si es premenopusica y puede quedar embarazada, solicite a su mdico asesoramiento previo a la concepcin.  Si puede quedar embarazada, tome 400 a 800microgramos (mcg) de cido flico todos los das.  Si desea evitar el embarazo, hable con su  mdico sobre el control de la natalidad (anticoncepcin). OSTEOPOROSIS Y MENOPAUSIA  La osteoporosis es una enfermedad en la que los huesos pierden los minerales y la fuerza por el avance de la edad. El resultado pueden ser fracturas graves en los huesos. El riesgo de osteoporosis puede identificarse con una prueba de densidad sea.  Si tiene 65aos o ms, o si est en riesgo de sufrir osteoporosis y fracturas, pregunte a su mdico si debe someterse a exmenes.  Consulte a su mdico si debe tomar un suplemento de calcio o de vitamina D para reducir el riesgo de osteoporosis.  La menopausia puede presentar ciertos sntomas fsicos y riesgos.  La terapia de reemplazo hormonal puede reducir algunos de estos sntomas y riesgos. Consulte a su mdico para saber si la terapia de reemplazo hormonal es conveniente para usted. INSTRUCCIONES PARA EL CUIDADO EN EL HOGAR  Realcese los estudios de rutina de la salud, dentales y de la vista.  Mantngase al da con las vacunas.  No consuma ningn producto que contenga tabaco, lo que incluye cigarrillos, tabaco de mascar o cigarrillos electrnicos.  Si est embarazada, no beba alcohol.  Si est amamantando, reduzca el consumo de alcohol y la frecuencia con la que consume.  Si es mujer y no est embarazada limite el consumo de alcohol a no ms de 1 medida por da. Una medida equivale a 12onzas de cerveza, 5onzas de vino o 1onzas de bebidas alcohlicas de alta graduacin.  No consuma drogas.  No comparta agujas.  Solicite ayuda a su mdico si necesita apoyo o informacin para abandonar las drogas.  Informe a su mdico si a menudo se siente deprimido.  Notifique a su mdico si alguna vez ha sido vctima de abuso o si no se siente seguro en su hogar. Esta informacin no tiene como fin reemplazar el consejo del mdico. Asegrese de hacerle al mdico cualquier pregunta que tenga. Document Released: 02/22/2011 Document Revised: 03/26/2014 Document  Reviewed: 12/07/2014 Elsevier Interactive Patient Education  2017 [employer]vier Inc.  

## 2016-06-18 NOTE — Progress Notes (Signed)
Subjective:     Aimee Benjamin is a 36 y.o. female who presents for a postpartum visit. She is 7 weeks postpartum following a spontaneous vaginal delivery. I have fully reviewed the prenatal and intrapartum course. The delivery was at 39 gestational weeks. Outcome: spontaneous vaginal delivery. Anesthesia: epidural. Postpartum course has been normal. Baby's course has been normal. Baby is feeding by bottle - Similac with soy. Bleeding staining only. Bowel function is normal. Bladder function is normal. Patient is not sexually active. Contraception method is wants IUD. Postpartum depression screening: negative.  The following portions of the patient's history were reviewed and updated as appropriate: allergies, current medications, past family history, past medical history, past social history, past surgical history and problem list.  Review of Systems Pertinent items are noted in HPI.   Objective:    BP 111/73   Pulse 88   Wt 145 lb 3.2 oz (65.9 kg)   BMI 28.36 kg/m   General:  alert, cooperative, appears stated age and no distress  Lungs: clear to auscultation bilaterally  Heart:  regular rate and rhythm, S1, S2 normal, no murmur, click, rub or gallop  Abdomen: soft, non-tender; bowel sounds normal; no masses,  no organomegaly        Assessment:     Normal postpartum exam. Pap smear not done at today's visit.     Plan:     1. Encounter for routine postpartum follow-up -Pt needs 2hr gtt d/t GDM -- not fasting today -- will have f/u for lab visit  2. Language barrier -Spanish video interpreter  3. Encounter for initial prescription of contraceptive pills -Pt interested in nexplanon but is self pay -- previously used OCPs -- will rx OCPs & refer to [facility] for nexplanon placement if still interested - norgestimate-ethinyl estradiol (ORTHO-CYCLEN,SPRINTEC,PREVIFEM) 0.25-35 MG-MCG tablet; Take 1 tablet by mouth daily.  Dispense: 1 Package; Refill: 11   Judeth Horn, NP

## 2016-06-26 ENCOUNTER — Other Ambulatory Visit: Payer: Self-pay

## 2016-06-26 DIAGNOSIS — O24419 Gestational diabetes mellitus in pregnancy, unspecified control: Secondary | ICD-10-CM

## 2016-06-27 LAB — GLUCOSE TOLERANCE, 2 HOURS
GLUCOSE FASTING GTT: 84 mg/dL (ref 65–99)
Glucose, 2 hour: 71 mg/dL (ref 65–139)

## 2016-06-28 ENCOUNTER — Encounter: Payer: Self-pay | Admitting: Advanced Practice Midwife

## 2017-05-28 ENCOUNTER — Other Ambulatory Visit: Payer: Self-pay | Admitting: *Deleted

## 2017-05-28 DIAGNOSIS — Z30011 Encounter for initial prescription of contraceptive pills: Secondary | ICD-10-CM

## 2017-05-28 MED ORDER — NORGESTIMATE-ETH ESTRADIOL 0.25-35 MG-MCG PO TABS: 1.0000 | ORAL_TABLET | Freq: Every day | ORAL | 3 refills | Status: DC

## 2018-02-09 IMAGING — US US MFM FETAL BPP W/O NON-STRESS
1 series · 15 of 18 positions shown · non-contrast
Comparison: none

[Series 1: us mfm fetal bpp w/o non-stress · 18 acquisitions, 15 frames shown]
[im 1/18]
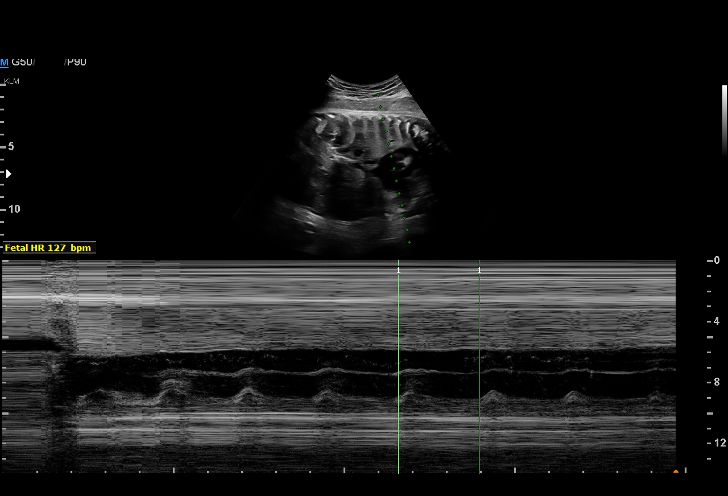
[im 2/18]
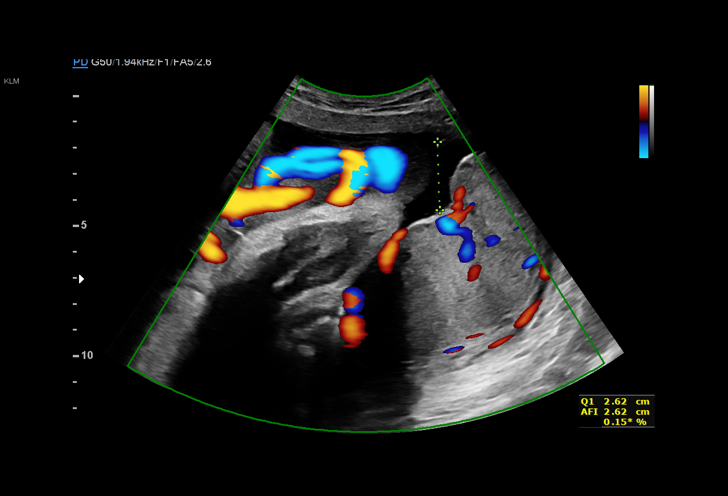
[im 4/18]
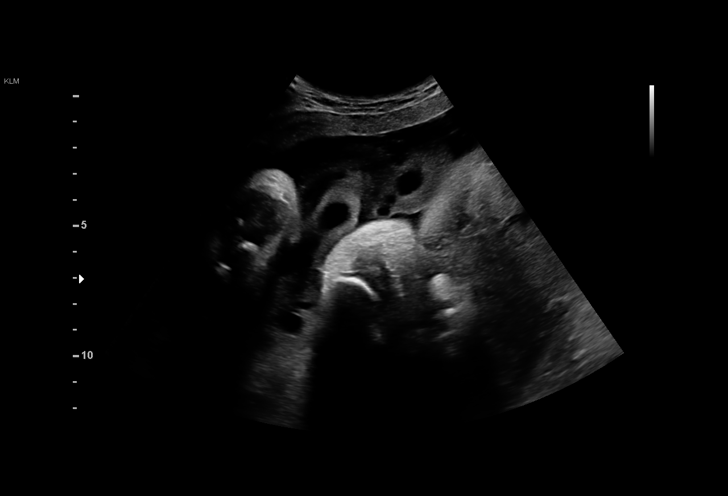
[im 5/18]
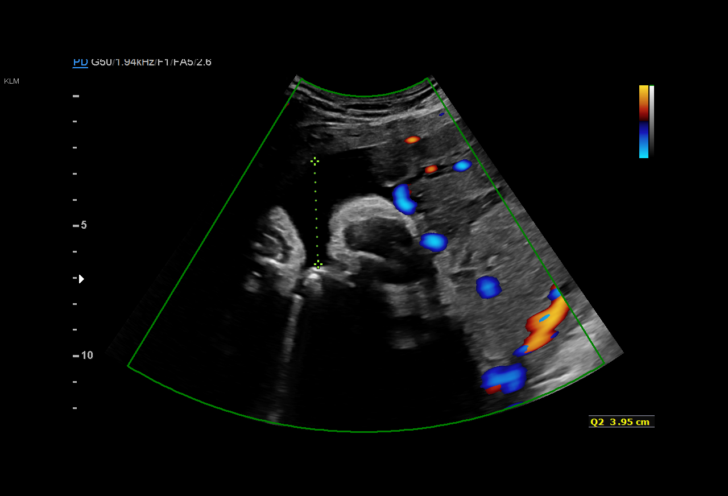
[im 6/18]
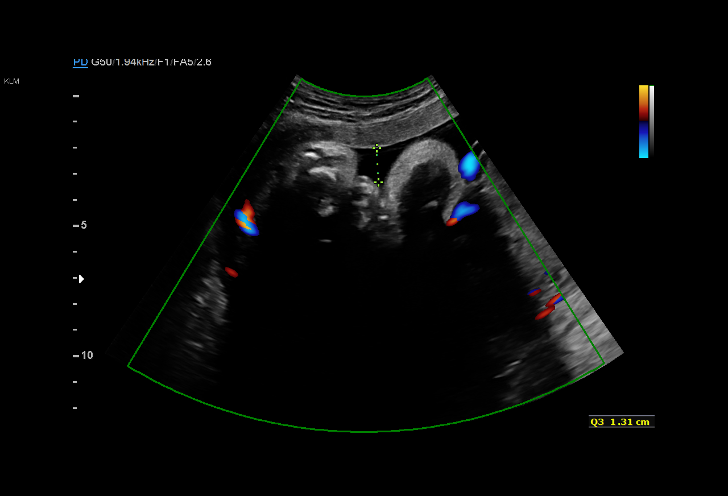
[im 7/18]
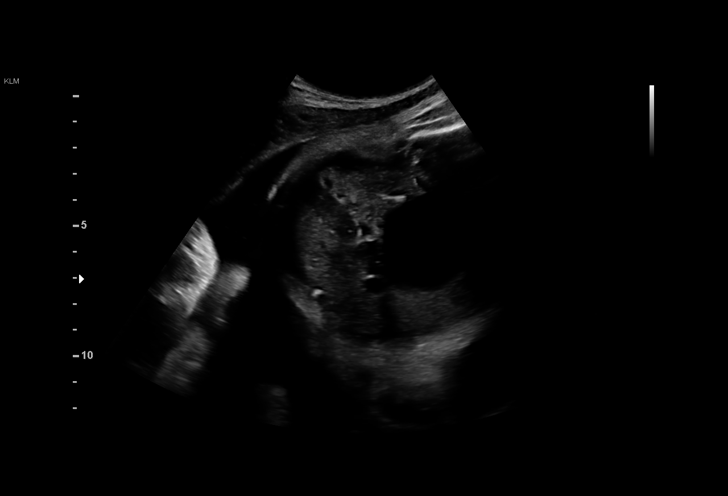
[im 8/18]
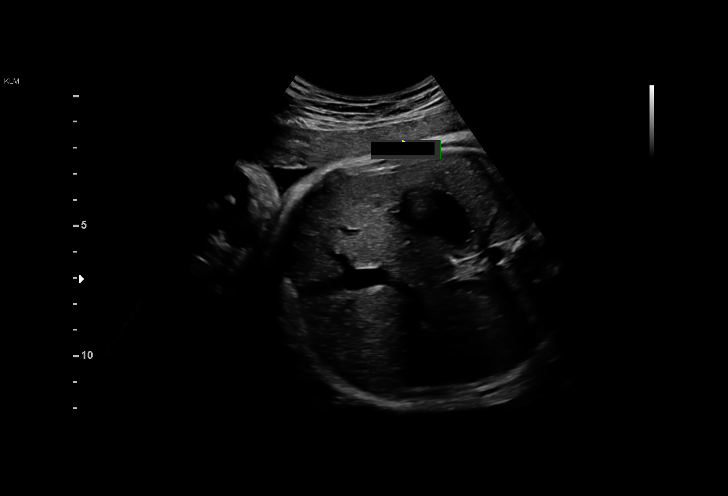
[im 10/18]
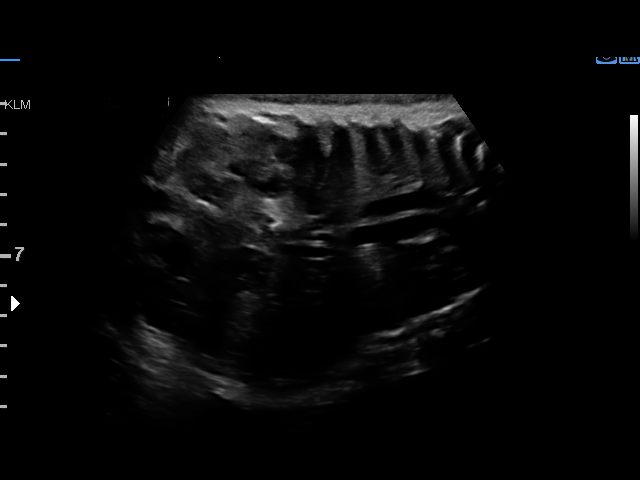
[im 11/18]
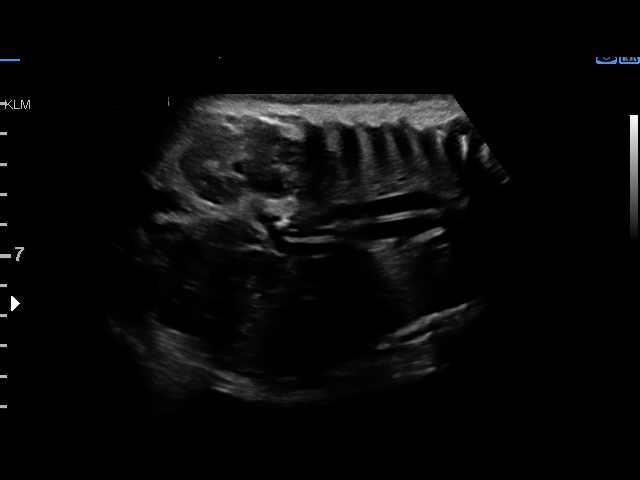
[im 12/18]
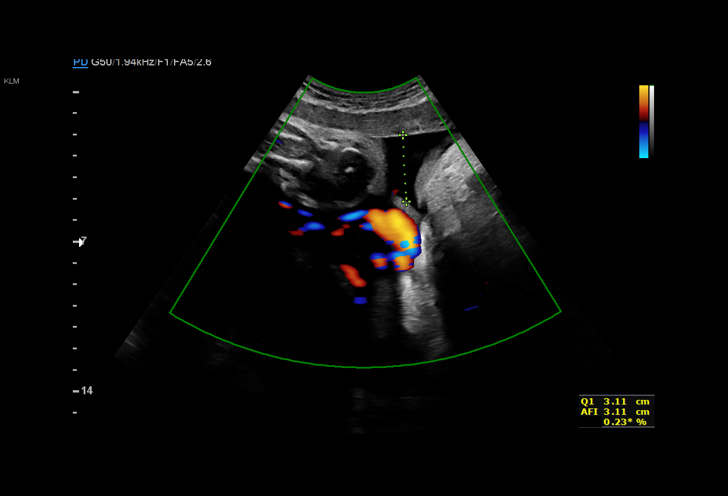
[im 13/18]
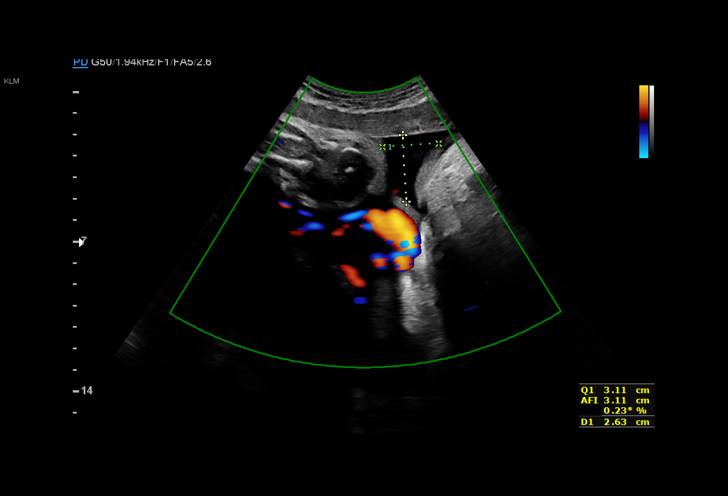
[im 14/18]
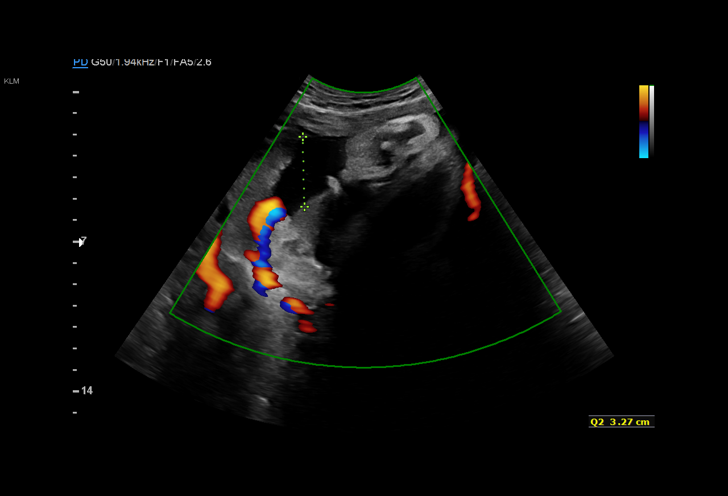
[im 16/18]
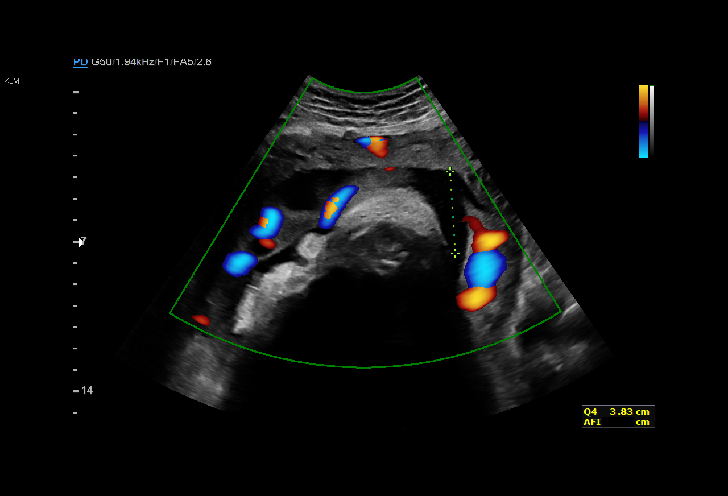
[im 17/18]
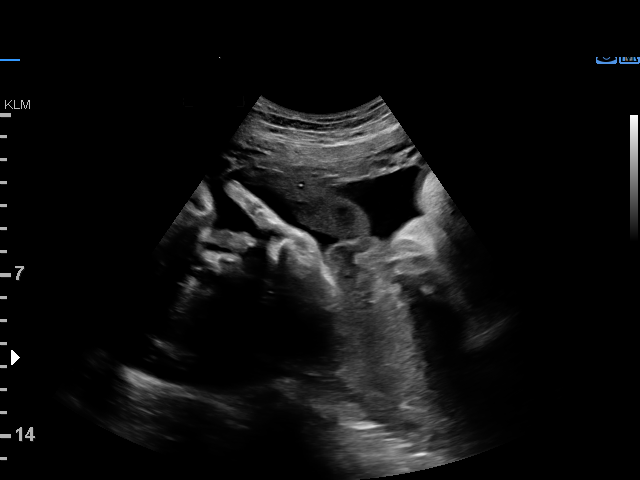
[im 18/18]
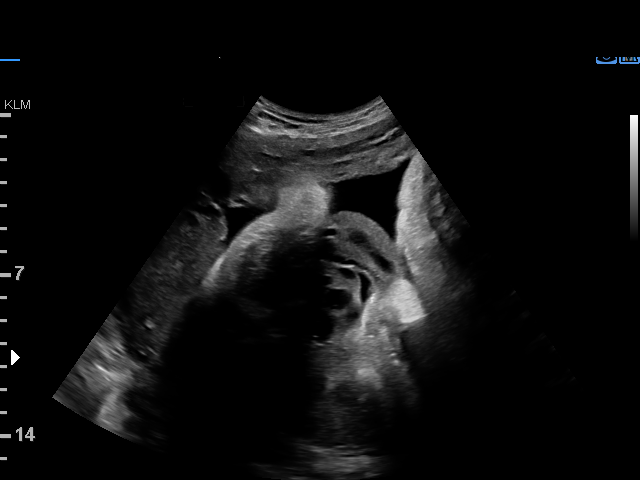

[15 of 18 positions shown; findings below may reference images not displayed]

1  JOHN ANDREAS TODAL              [id number]      [id number]     [id number]
Indications

37 weeks gestation of pregnancy
Advanced maternal age multigravida 35+,
second trimester (Pt declined all testing)
Poor obstetric history: Previous preterm
delivery, antepartum (@40weeks, cervical
cerclage with 2 previous pregnancies)- 17P
Gestational diabetes in pregnancy,
controlled by oral hypoglycemic drugs
Oligohydraminios, third trimester, unspecified
OB History

Gravidity:    8         Term:   2        Prem:   1        SAB:   0
TOP:          0       Ectopic:  0        Living: 2
Fetal Evaluation

Num Of Fetuses:     1
Fetal Heart         127
Rate(bpm):
Cardiac Activity:   Observed
Presentation:       Transverse, head to maternal left

Amniotic Fluid
AFI FV:      Subjectively within normal limits

AFI Sum(cm)     %Tile       Largest Pocket(cm)
12.57           44

RUQ(cm)       RLQ(cm)       LUQ(cm)        LLQ(cm)
3.1

Comment:    [DATE] BPP in 7 mins.
Biophysical Evaluation

Amniotic F.V:   Within normal limits       F. Tone:        Observed
F. Movement:    Observed                   Score:          [DATE]
F. Breathing:   Observed
Gestational Age

LMP:           39w 5d       Date:   07/13/15                 EDD:   04/18/16
Best:          37w 3d    Det. By:   U/S  (12/09/15)          EDD:   05/04/16
Impression

SIUP at 37+3 weeks
Transverse presentation (per sonographer - no images to
review)
Normal amniotic fluid volume (AFI = 12)
BPP [DATE]
Recommendations

Continue twice weekly NSTs with weekly AFIs
Check fetal position with next AFI

## 2019-11-09 ENCOUNTER — Ambulatory Visit (INDEPENDENT_AMBULATORY_CARE_PROVIDER_SITE_OTHER): Payer: Self-pay | Admitting: *Deleted

## 2019-11-09 ENCOUNTER — Other Ambulatory Visit: Payer: Self-pay

## 2019-11-09 ENCOUNTER — Encounter: Payer: Self-pay | Admitting: *Deleted

## 2019-11-09 VITALS — BP 112/70 | HR 100 | Wt 162.7 lb

## 2019-11-09 DIAGNOSIS — O09899 Supervision of other high risk pregnancies, unspecified trimester: Secondary | ICD-10-CM

## 2019-11-09 DIAGNOSIS — O09299 Supervision of pregnancy with other poor reproductive or obstetric history, unspecified trimester: Secondary | ICD-10-CM

## 2019-11-09 DIAGNOSIS — O0990 Supervision of high risk pregnancy, unspecified, unspecified trimester: Secondary | ICD-10-CM

## 2019-11-09 NOTE — Progress Notes (Signed)
Interpreter Maretta Los present for encounter. New Ob intake completed. Medication reconciliation completed. Pt states she had [place] @ [facility] on 10/22/19 which gave EDD 05/25/20. This is 3 weeks different from EDD by LMP.  She also has history of cerclage with 2 prior pregnancies. She reports no vaginal bleeding and is currently not having sex due to this history. Pt is enrolled in the [employer] program and will need [place] for dating as well as cervical evaluation with [place] [facility]. This will be scheduled during check-out today. New Ob provider appt scheduled on 9/16. Pt voiced understanding of all information and instructions provided.

## 2019-11-10 NOTE — Progress Notes (Signed)
Chart reviewed - agree with CMA/RN documentation.  ° °

## 2019-11-17 ENCOUNTER — Encounter: Payer: Self-pay | Admitting: General Practice

## 2019-11-26 ENCOUNTER — Encounter: Payer: Self-pay | Admitting: General Practice

## 2019-12-03 ENCOUNTER — Other Ambulatory Visit: Payer: Self-pay

## 2019-12-03 ENCOUNTER — Ambulatory Visit (INDEPENDENT_AMBULATORY_CARE_PROVIDER_SITE_OTHER): Payer: Self-pay | Admitting: Family Medicine

## 2019-12-03 VITALS — BP 120/77 | HR 89 | Wt 167.1 lb

## 2019-12-03 DIAGNOSIS — Z8751 Personal history of pre-term labor: Secondary | ICD-10-CM | POA: Insufficient documentation

## 2019-12-03 DIAGNOSIS — Z603 Acculturation difficulty: Secondary | ICD-10-CM

## 2019-12-03 DIAGNOSIS — O0990 Supervision of high risk pregnancy, unspecified, unspecified trimester: Secondary | ICD-10-CM

## 2019-12-03 DIAGNOSIS — O09522 Supervision of elderly multigravida, second trimester: Secondary | ICD-10-CM

## 2019-12-03 DIAGNOSIS — K429 Umbilical hernia without obstruction or gangrene: Secondary | ICD-10-CM

## 2019-12-03 DIAGNOSIS — O09899 Supervision of other high risk pregnancies, unspecified trimester: Secondary | ICD-10-CM

## 2019-12-03 DIAGNOSIS — Z789 Other specified health status: Secondary | ICD-10-CM

## 2019-12-03 DIAGNOSIS — Z23 Encounter for immunization: Secondary | ICD-10-CM

## 2019-12-03 DIAGNOSIS — Z8632 Personal history of gestational diabetes: Secondary | ICD-10-CM

## 2019-12-03 DIAGNOSIS — O09299 Supervision of pregnancy with other poor reproductive or obstetric history, unspecified trimester: Secondary | ICD-10-CM

## 2019-12-03 NOTE — Progress Notes (Signed)
Subjective:  Aimee Benjamin is a [id number] [redacted]w[redacted]d being seen today for her first obstetrical visit.  Her obstetrical history is significant for advanced maternal age and history of cervical incompetence and preterm delivery. Previously had cerclage, but didn't have insurance with last pregnancy and presented too late for cerclage. She had Makena during pregnancy.. Patient does intend to breast feed. Pregnancy history fully reviewed.  Patient reports pain around her umbilicus. Happens occasionally, but when she lifts something heavy, it causes her pain that lasts for a day..  BP 120/77   Pulse 89   Wt 167 lb 1.6 oz (75.8 kg)   LMP 08/01/2019   BMI 32.63 kg/m   HISTORY: OB History  Gravida Para Term Preterm AB Living  9 4 3 1 4 3   SAB TAB Ectopic Multiple Live Births  4     0 4    # Outcome Date GA Lbr Len/2nd Weight Sex Delivery Anes PTL Lv  9 Current           8 Term 04/28/16 [redacted]w[redacted]d 00:51 / 00:12 7 lb 9.4 oz (3.442 kg) F Vag-Spont EPI  LIV  7 Term 08/14/14 [redacted]w[redacted]d 10:54 / 00:53 8 lb 10.6 oz (3.93 kg) F Vag-Spont EPI  LIV     Birth Comments: No problems at birth  9 Term 02/16/13 [redacted]w[redacted]d 06:36 / 00:37 6 lb 13.6 oz (3.107 kg) M Vag-Spont EPI  LIV  5 SAB 2012 [redacted]w[redacted]d       DEC  4 Preterm 12/2009 [redacted]w[redacted]d    Vag-Spont   DEC  3 SAB 10/2008 [redacted]w[redacted]d       DEC     Birth Comments: Missed AB  2 SAB 09/2008 [redacted]w[redacted]d       DEC  1 SAB 2009 [redacted]w[redacted]d       DEC    Obstetric Comments  Previously with Dr. [redacted]w[redacted]d. Mother had a cervicalgia for both previous babies.     Past Medical History:  Diagnosis Date  . Diabetes mellitus without complication (HCC)   . GDM, class A2 11/28/2015   PP GTT Nml  . Incompetence of cervix   . Preterm labor     Past Surgical History:  Procedure Laterality Date  . CERVICAL CERCLAGE N/A 08/27/2012   Procedure: CERCLAGE CERVICAL;  Surgeon: 10/27/2012, MD;  Location: [facility];  Service: Gynecology;  Laterality: N/A;  . CERVICAL CERCLAGE N/A 02/09/2014   Procedure: CERCLAGE  CERVICAL;  Surgeon: 02/11/2014, MD;  Location: [facility];  Service: Gynecology;  Laterality: N/A;  . CHOLECYSTECTOMY      Family History  Problem Relation Age of Onset  . Diabetes Mother      Exam  BP 120/77   Pulse 89   Wt 167 lb 1.6 oz (75.8 kg)   LMP 08/01/2019   BMI 32.63 kg/m   Chaperone present during exam  CONSTITUTIONAL: Well-developed, well-nourished female in no acute distress.  HENT:  Normocephalic, atraumatic, External right and left ear normal. Oropharynx is clear and moist EYES: Conjunctivae and EOM are normal. Pupils are equal, round, and reactive to light. No scleral icterus.  NECK: Normal range of motion, supple, no masses.  Normal thyroid.  CARDIOVASCULAR: Normal heart rate noted, regular rhythm RESPIRATORY: Clear to auscultation bilaterally. Effort and breath sounds normal, no problems with respiration noted. ABDOMEN: 3cm umbilical hernia. No entrapped intestines. Soft, normal bowel sounds, no distention noted.  No tenderness, rebound or guarding.  MUSCULOSKELETAL: Normal range of motion. No tenderness.  No cyanosis, clubbing, or  edema.  2+ distal pulses. SKIN: Skin is warm and dry. No rash noted. Not diaphoretic. No erythema. No pallor. NEUROLOGIC: Alert and oriented to person, place, and time. Normal reflexes, muscle tone coordination. No cranial nerve deficit noted. PSYCHIATRIC: Normal mood and affect. Normal behavior. Normal judgment and thought content.    Assessment:    Pregnancy: [id number] Patient Active Problem List   Diagnosis Date Noted  . Supervision of high risk pregnancy, antepartum 12/03/2019  . Umbilical hernia without obstruction and without gangrene 12/03/2019  . History of gestational diabetes mellitus (GDM) 12/03/2019  . History of preterm delivery, currently pregnant 12/03/2019  . Language barrier 03/08/2016  . Multigravida of advanced maternal age in second trimester 11/28/2015  . History of cervical incompetence in pregnancy,  currently pregnant 11/28/2015  . GDM, class A2 11/28/2015  . BMI 32.0-32.9,adult 11/28/2015      Plan:   1. Supervision of high risk pregnancy, antepartum FHT and FH normal - Culture, OB Urine - GC/Chlamydia probe amp ([place])not at [facility] - CBC/D/Plt+RPR+Rh+ABO+Rub Ab... - Flu Vaccine QUAD 36+ mos IM  2. History of cervical incompetence in pregnancy, currently pregnant Does not want cerclage.  3. History of preterm delivery, currently pregnant Will get Makena.   4. Language barrier Interpreter used  5. History of gestational diabetes mellitus (GDM) 2hr GTT today  6. Umbilical hernia without obstruction and without gangrene No lifting greater than 15 pounds.  Precautions given, including when to go to the ED.  7. AMA Start ASA 81mg .    Problem list reviewed and updated. 75% of 30 min visit spent on counseling and coordination of care.     12/03/2019

## 2019-12-03 NOTE — Progress Notes (Signed)
abdominal pain on left side. States belly button hurts

## 2019-12-03 NOTE — Patient Instructions (Signed)
COVID-19 Vaccine Information can be found at: [email or web address] For questions related to vaccine distribution or appointments, please email [email or web address] or call [phone]    

## 2019-12-04 ENCOUNTER — Other Ambulatory Visit: Payer: Self-pay | Admitting: Family Medicine

## 2019-12-04 DIAGNOSIS — O24419 Gestational diabetes mellitus in pregnancy, unspecified control: Secondary | ICD-10-CM

## 2019-12-04 LAB — CBC/D/PLT+RPR+RH+ABO+RUB AB...
Antibody Screen: NEGATIVE
Basophils Absolute: 0 10*3/uL (ref 0.0–0.2)
Basos: 1 %
EOS (ABSOLUTE): 0.2 10*3/uL (ref 0.0–0.4)
Eos: 2 %
HCV Ab: <0.1 s/co ratio (ref 0.0–0.9)
HIV Screen 4th Generation wRfx: NONREACTIVE
Hematocrit: 39.8 % (ref 34.0–46.6)
Hemoglobin: 13.3 g/dL (ref 11.1–15.9)
Hepatitis B Surface Ag: NEGATIVE
Immature Grans (Abs): 0.1 10*3/uL (ref 0.0–0.1)
Immature Granulocytes: 2 %
Lymphocytes Absolute: 1.7 10*3/uL (ref 0.7–3.1)
Lymphs: 19 %
MCH: 29.6 pg (ref 26.6–33.0)
MCHC: 33.4 g/dL (ref 31.5–35.7)
MCV: 89 fL (ref 79–97)
Monocytes Absolute: 0.5 10*3/uL (ref 0.1–0.9)
Monocytes: 6 %
Neutrophils Absolute: 6.2 10*3/uL (ref 1.4–7.0)
Neutrophils: 70 %
Platelets: 166 10*3/uL (ref 150–450)
RBC: 4.49 x10E6/uL (ref 3.77–5.28)
RDW: 13.1 % (ref 11.7–15.4)
RPR Ser Ql: NONREACTIVE
Rh Factor: POSITIVE
Rubella Antibodies, IGG: 5.31 index (ref >0.99)
WBC: 8.7 10*3/uL (ref 3.4–10.8)

## 2019-12-04 LAB — HEMOGLOBIN A1C
Est. average glucose Bld gHb Est-mCnc: 123 mg/dL
Hgb A1c MFr Bld: 5.9 % — ABNORMAL HIGH (ref 4.8–5.6)

## 2019-12-04 LAB — HCV INTERPRETATION

## 2019-12-04 LAB — GLUCOSE TOLERANCE, 2 HOURS W/ 1HR
Glucose, 1 hour: 236 mg/dL — ABNORMAL HIGH (ref 65–179)
Glucose, 2 hour: 198 mg/dL — ABNORMAL HIGH (ref 65–152)
Glucose, Fasting: 113 mg/dL — ABNORMAL HIGH (ref 65–91)

## 2019-12-05 LAB — URINE CULTURE, OB REFLEX

## 2019-12-05 LAB — CULTURE, OB URINE

## 2019-12-15 ENCOUNTER — Other Ambulatory Visit: Payer: Self-pay

## 2019-12-15 ENCOUNTER — Encounter: Payer: Self-pay | Attending: Family Medicine | Admitting: Registered"

## 2019-12-15 ENCOUNTER — Ambulatory Visit: Payer: Self-pay | Admitting: Registered"

## 2019-12-15 DIAGNOSIS — O24419 Gestational diabetes mellitus in pregnancy, unspecified control: Secondary | ICD-10-CM | POA: Insufficient documentation

## 2019-12-15 DIAGNOSIS — Z3A00 Weeks of gestation of pregnancy not specified: Secondary | ICD-10-CM | POA: Insufficient documentation

## 2019-12-15 NOTE — Progress Notes (Signed)
Interpreter services provided by Aundria Mems from [facility]  GA:[redacted]w[redacted]d; A1c 5.9%. CBG 254 mg/dL at today's visit.  Patient will likely need insulin. Patient will return for follow-up next week, RD to review log and MD to decide what medication is needed.  Patient was seen on 12/15/19 for Gestational Diabetes self-management. EDD 05/07/20. Patient states prior history of oral medication controlled GDM. Diet history obtained. Patient eats variety of all food groups, limited vegetable intake. Beverages include water, milk.  Patient reports no structured physical activity d/t exercise restrictions.  The following learning objectives were met by the patient :   States the definition of Gestational Diabetes  States why dietary management is important in controlling blood glucose  Describes the effects of carbohydrates on blood glucose levels  Demonstrates ability to create a balanced meal plan  Demonstrates carbohydrate counting   States when to check blood glucose levels  Demonstrates proper blood glucose monitoring techniques  States the effect of stress and exercise on blood glucose levels  States the importance of limiting caffeine and abstaining from alcohol and smoking  Plan:  Aim for 3 Carbohydrate Choices per meal (45 grams) +/- 1 either way  Aim for 1-2 Carbohydrate Choices per snack Begin reading food labels for Total Carbohydrate of foods If OK with your MD, consider  increasing your activity level by walking, Arm Chair Exercises or other activity daily as tolerated Begin checking Blood Glucose before breakfast and 2 hours after first bite of breakfast, lunch and dinner as directed by MD  Bring Log Book/Sheet and meter to every medical appointment  Baby Scripts: PPatient not appropriate for [employer] due to language barrier. Take medication if directed by MD  Blood glucose monitor given: Prodigy Lot # [id number] CBG: 254 mg/dL after breakfast: cereal, banana,  water  Patient instructed to monitor glucose levels: FBS: 60 - 95 mg/dl 2 hour: <120 mg/dl  Patient received the following handouts:  Nutrition Diabetes and Pregnancy  Carbohydrate Counting List  Blood glucose Log Sheet  Patient will be seen for follow-up in 1 week.

## 2019-12-17 ENCOUNTER — Other Ambulatory Visit: Payer: Self-pay

## 2019-12-17 ENCOUNTER — Other Ambulatory Visit: Payer: Self-pay | Admitting: *Deleted

## 2019-12-17 DIAGNOSIS — O24419 Gestational diabetes mellitus in pregnancy, unspecified control: Secondary | ICD-10-CM

## 2019-12-22 ENCOUNTER — Other Ambulatory Visit: Payer: Self-pay

## 2019-12-22 ENCOUNTER — Ambulatory Visit: Payer: Self-pay | Admitting: Registered"

## 2019-12-22 ENCOUNTER — Encounter: Payer: Self-pay | Attending: Family Medicine | Admitting: Registered"

## 2019-12-22 DIAGNOSIS — Z3A00 Weeks of gestation of pregnancy not specified: Secondary | ICD-10-CM | POA: Insufficient documentation

## 2019-12-22 DIAGNOSIS — O24419 Gestational diabetes mellitus in pregnancy, unspecified control: Secondary | ICD-10-CM | POA: Insufficient documentation

## 2019-12-22 NOTE — Progress Notes (Signed)
In-person Interpreter services provided by Josefina Do from [facility]  Patient was seen on 12/22/19 for follow-up assessment and education for Gestational Diabetes. EDD 05/07/20. Although patient was medication controlled last pregnancy, with her recent dietary changes it appears patient may be able to control GDM with diet and lifestyle for now.  Patient states changes to diet/lifestyle including using carb counting to include 3 carb servings per meal. Patient states she also stopped eating sweets.   Patient is testing blood glucose as directed pre breakfast and 2 hours after each meal. Review of Log sheet shows: FBS: 93-101, 50% WNL; post meal all WNL    The following learning objectives reviewed during follow-up visit:   Exercise role in helping fasting number  Pattern of decreasing fasting blood sugar likely to continue  Plan:  . Continue with dietary changes . Consider 15-20 minutes of physical activity in the evenings   Patient instructed to monitor glucose levels: FBS: 60 - 95 mg/dl 2 hour: <947 mg/dl  Patient received the following handouts:  none  Patient will be seen for follow-up in 1 weeks or as needed.

## 2019-12-24 ENCOUNTER — Other Ambulatory Visit: Payer: Self-pay

## 2019-12-29 ENCOUNTER — Encounter: Payer: Self-pay | Admitting: Registered"

## 2019-12-29 ENCOUNTER — Ambulatory Visit: Payer: Self-pay | Admitting: Registered"

## 2019-12-29 ENCOUNTER — Other Ambulatory Visit: Payer: Self-pay

## 2019-12-29 DIAGNOSIS — O24419 Gestational diabetes mellitus in pregnancy, unspecified control: Secondary | ICD-10-CM

## 2019-12-29 NOTE — Progress Notes (Signed)
In-person interpreter services provided by Raquel from [place]  Patient was seen on 12/29/19 for follow-up assessment and education for Gestational Diabetes. EDD 05/07/20. Patient states changes to diet/lifestyle including more movement/exercise, bedtime snack of Yoplait yogurt 1 g added sugar.   Patient is testing blood glucose as directed pre breakfast and 2 hours after each meal. Review of Log sheet shows no change from last visit, FBS elevated, post meal numbers mostly within range.  The following learning objectives reviewed during follow-up visit:   Reviewed role of exercise  Discussed alternate snacks  Reviewed potential medication needs as pregnancy progresses  Plan:  . For next 3 days try switching the type of snack before bed . Take log sheet to your MD visit on Friday.  Hx GDM A2, Patient is aware that medication is likely needed.  Patient instructed to monitor glucose levels: FBS: 60 - 95 mg/dl 2 hour: <678 mg/dl  Patient received the following handouts: none  Patient will be seen for follow-up as needed.

## 2019-12-31 ENCOUNTER — Other Ambulatory Visit: Payer: Self-pay

## 2020-01-01 ENCOUNTER — Ambulatory Visit (INDEPENDENT_AMBULATORY_CARE_PROVIDER_SITE_OTHER): Payer: Self-pay | Admitting: Family Medicine

## 2020-01-01 VITALS — BP 116/67 | HR 91 | Wt 165.0 lb

## 2020-01-01 DIAGNOSIS — O09299 Supervision of pregnancy with other poor reproductive or obstetric history, unspecified trimester: Secondary | ICD-10-CM

## 2020-01-01 DIAGNOSIS — Z3A21 21 weeks gestation of pregnancy: Secondary | ICD-10-CM

## 2020-01-01 DIAGNOSIS — O09522 Supervision of elderly multigravida, second trimester: Secondary | ICD-10-CM

## 2020-01-01 DIAGNOSIS — Z789 Other specified health status: Secondary | ICD-10-CM

## 2020-01-01 DIAGNOSIS — O09899 Supervision of other high risk pregnancies, unspecified trimester: Secondary | ICD-10-CM

## 2020-01-01 DIAGNOSIS — O0990 Supervision of high risk pregnancy, unspecified, unspecified trimester: Secondary | ICD-10-CM

## 2020-01-01 DIAGNOSIS — O24410 Gestational diabetes mellitus in pregnancy, diet controlled: Secondary | ICD-10-CM

## 2020-01-01 MED ORDER — METFORMIN HCL 500 MG PO TABS: 500.0000 mg | ORAL_TABLET | Freq: Every day | ORAL | 5 refills | Status: DC

## 2020-01-01 MED ORDER — MICONAZOLE NITRATE 2 % VA CREA: 1.0000 | TOPICAL_CREAM | Freq: Every day | VAGINAL | 2 refills | Status: DC

## 2020-01-01 MED ORDER — ASPIRIN EC 81 MG PO TBEC: 81.0000 mg | DELAYED_RELEASE_TABLET | Freq: Every day | ORAL | 2 refills | Status: DC

## 2020-01-01 NOTE — Progress Notes (Signed)
   PRENATAL VISIT NOTE  Subjective:  Aimee Benjamin is a 39 y.o. [phone] at [redacted]w[redacted]d being seen today for ongoing prenatal care.  She is currently monitored for the following issues for this high-risk pregnancy and has Multigravida of advanced maternal age in second trimester; History of cervical incompetence in pregnancy, currently pregnant; Gestational diabetes mellitus (GDM), antepartum; BMI 32.0-32.9,adult; Language barrier; Supervision of high risk pregnancy, antepartum; Umbilical hernia without obstruction and without gangrene; History of gestational diabetes mellitus (GDM); and History of preterm delivery, currently pregnant on their problem list.  Patient reports vaginal burning and itching..  Contractions: Not present. Vag. Bleeding: None.  Movement: Present. Denies leaking of fluid.   GDM: Patient diet controlled.  Reports no hypoglycemic episodes.  Tolerating medication well Fasting: 88-120. Mostly elevated. Eating bedtime snack. Was eating yogurt, tried switching to apple/peanut butter, but did not improve. 2hr PP: controlled   The following portions of the patient's history were reviewed and updated as appropriate: allergies, current medications, past family history, past medical history, past social history, past surgical history and problem list.   Objective:   Vitals:   01/01/20 1043  BP: 116/67  Pulse: 91  Weight: 165 lb (74.8 kg)    Fetal Status: Fetal Heart Rate (bpm): 156   Movement: Present     General:  Alert, oriented and cooperative. Patient is in no acute distress.  Skin: Skin is warm and dry. No rash noted.   Cardiovascular: Normal heart rate noted  Respiratory: Normal respiratory effort, no problems with respiration noted  Abdomen: Soft, gravid, appropriate for gestational age.  Pain/Pressure: Absent     Pelvic: Cervical exam deferred        Extremities: Normal range of motion.  Edema: None  Mental Status: Normal mood and affect. Normal behavior. Normal  judgment and thought content.   Assessment and Plan:  Pregnancy: [id number] at [redacted]w[redacted]d 1. [redacted] weeks gestation of pregnancy  2. Supervision of high risk pregnancy, antepartum FHT and FH normal  3. History of preterm delivery, currently pregnant Desires Makena  4. History of cervical incompetence in pregnancy, currently pregnant Declines cerclage  5. Multigravida of advanced maternal age in second trimester ASA 81mg  daily  6. Language barrier Interpreter used  7. Diet controlled gestational diabetes mellitus (GDM), antepartum Start metformin at bedtime.  Growth every 4 weeks  Preterm labor symptoms and general obstetric precautions including but not limited to vaginal bleeding, contractions, leaking of fluid and fetal movement were reviewed in detail with the patient. Please refer to After Visit Summary for other counseling recommendations.   No follow-ups on file.  No future appointments.  [place], DO

## 2020-01-01 NOTE — Patient Instructions (Signed)
[place] appointment at [facility] Department on October 25th @ 1030am.

## 2020-01-01 NOTE — Progress Notes (Signed)
Spanish Interpreter Nohella T.  

## 2020-01-04 ENCOUNTER — Telehealth: Payer: Self-pay | Admitting: *Deleted

## 2020-01-04 NOTE — Telephone Encounter (Signed)
Aimee Benjamin was talking with our pregnancy navigator and informed her she has been unable to get her Metformin prescription. States she has called the pharmacy twice and they have not received the prescription. States she has gotten over the counter ASA 81mg  and Monistat.   I called [facility] on [address] and they informed me has  3 profiles under different names but have not received any prescriptions for her in a while. I gave them the order for her Metformin and they will fill it today.  Navigator will notify [place] she can pick up later today. Jadarious Dobbins,RN

## 2020-01-12 ENCOUNTER — Telehealth: Payer: Self-pay | Admitting: Lactation Services

## 2020-01-12 ENCOUNTER — Encounter: Payer: Self-pay | Admitting: General Practice

## 2020-01-12 NOTE — Telephone Encounter (Signed)
Aimee Benjamin @ [facility] called and reports that they are not able to approve Aimee Benjamin as she would be a late start after 20 weeks 6 days and also cannot receive assistance from Aimee Benjamin due to late start. They are closing the account. Will notify Dr. Adrian Blackwater.

## 2020-01-18 ENCOUNTER — Ambulatory Visit (INDEPENDENT_AMBULATORY_CARE_PROVIDER_SITE_OTHER): Payer: Self-pay | Admitting: Obstetrics and Gynecology

## 2020-01-18 ENCOUNTER — Other Ambulatory Visit: Payer: Self-pay

## 2020-01-18 VITALS — BP 109/56 | HR 91 | Wt 165.0 lb

## 2020-01-18 DIAGNOSIS — O0990 Supervision of high risk pregnancy, unspecified, unspecified trimester: Secondary | ICD-10-CM

## 2020-01-18 DIAGNOSIS — O09299 Supervision of pregnancy with other poor reproductive or obstetric history, unspecified trimester: Secondary | ICD-10-CM

## 2020-01-18 DIAGNOSIS — Z789 Other specified health status: Secondary | ICD-10-CM

## 2020-01-18 DIAGNOSIS — O24415 Gestational diabetes mellitus in pregnancy, controlled by oral hypoglycemic drugs: Secondary | ICD-10-CM

## 2020-01-18 DIAGNOSIS — O09522 Supervision of elderly multigravida, second trimester: Secondary | ICD-10-CM

## 2020-01-18 DIAGNOSIS — Z3A24 24 weeks gestation of pregnancy: Secondary | ICD-10-CM | POA: Insufficient documentation

## 2020-01-18 DIAGNOSIS — Z8632 Personal history of gestational diabetes: Secondary | ICD-10-CM

## 2020-01-18 DIAGNOSIS — O09899 Supervision of other high risk pregnancies, unspecified trimester: Secondary | ICD-10-CM

## 2020-01-18 NOTE — Progress Notes (Signed)
U/S scheduled for f/u at [facility] on November 15th @ 0915.  Pt notified.   Addison Naegeli, RN  01/18/20

## 2020-01-18 NOTE — Progress Notes (Signed)
   PRENATAL VISIT NOTE  Subjective:  Aimee Benjamin is a 39 y.o. [phone] at [redacted]w[redacted]d being seen today for ongoing prenatal care.  She is currently monitored for the following issues for this high-risk pregnancy and has Multigravida of advanced maternal age in second trimester; History of cervical incompetence in pregnancy, currently pregnant; Gestational diabetes mellitus (GDM), antepartum; BMI 32.0-32.9,adult; Language barrier; Supervision of high risk pregnancy, antepartum; Umbilical hernia without obstruction and without gangrene; History of gestational diabetes mellitus (GDM); History of preterm delivery, currently pregnant; and [redacted] weeks gestation of pregnancy on their problem list.  Patient doing well with no acute concerns today. She reports no complaints.  Contractions: Not present. Vag. Bleeding: None.  Movement: Present. Denies leaking of fluid.   Pt notes compliance with metformin  Good blood sugar control noted FBS:79-107  2 abnormal PPBS:controlled  The following portions of the patient's history were reviewed and updated as appropriate: allergies, current medications, past family history, past medical history, past social history, past surgical history and problem list. Problem list updated.  Objective:   Vitals:   01/18/20 1622  BP: (!) 109/56  Pulse: 91  Weight: 165 lb (74.8 kg)    Fetal Status: Fetal Heart Rate (bpm): 152   Movement: Present     General:  Alert, oriented and cooperative. Patient is in no acute distress.  Skin: Skin is warm and dry. No rash noted.   Cardiovascular: Normal heart rate noted  Respiratory: Normal respiratory effort, no problems with respiration noted  Abdomen: Soft, gravid, appropriate for gestational age.  Pain/Pressure: Absent     Pelvic: Cervical exam deferred        Extremities: Normal range of motion.  Edema: None  Mental Status:  Normal mood and affect. Normal behavior. Normal judgment and thought content.   Assessment and Plan:    Pregnancy: [id number] at [redacted]w[redacted]d  1. Supervision of high risk pregnancy, antepartum Check third trimester labs next visit EFW was 66%, will get one more growth at [place] in 1 month and pt qill need weekly testing at 32 weeks  2. Language barrier Interpreter present  3. History of preterm delivery, currently pregnant No s/sx of PTL  4. History of gestational diabetes mellitus (GDM)   5. [redacted] weeks gestation of pregnancy   6. Gestational diabetes mellitus (GDM) controlled on oral hypoglycemic drug, antepartum Good blood sugar control with metformin  7. History of cervical incompetence in pregnancy, currently pregnant No cerclage at this time , recent u/s showed CL of 5 cm  8. Multigravida of advanced maternal age in second trimester   Preterm labor symptoms and general obstetric precautions including but not limited to vaginal bleeding, contractions, leaking of fluid and fetal movement were reviewed in detail with the patient.  Please refer to After Visit Summary for other counseling recommendations.   Return in about 2 weeks (around 02/01/2020) for [facility], in person, 3rd trim labs.   Mariel Aloe, MD

## 2020-01-18 NOTE — Patient Instructions (Signed)
Diabetes mellitus gestacional, cuidados personales Gestational Diabetes Mellitus, Self Care Las mujeres que tienen diabetes gestacional (diabetes mellitus gestacional) deben mantener su nivel de azcar en la sangre (glucosa) dentro de un rango saludable. Es posible hacerlo por medio de lo siguiente:  Alimentacin.  Actividad fsica.  Cambios en el estilo de vida.  Medicamentos o insulina, si es necesario.  [employer] mdicos y de Producer, television/film/video. Si recibe tratamiento para esta afeccin, es posible que ni usted ni su beb en gestacin (feto) se vean afectados. Si no recibe tratamiento, esta afeccin puede causar problemas que pueden ser perjudiciales para usted o su beb en gestacin. Si tiene diabetes gestacional:  Es ms probable que vuelva a tenerla si queda embarazada nuevamente.  Es ms probable que desarrolle diabetes tipo2 en el futuro. Cmo mantenerse informada sobre Retail buyer de azcar en la sangre   Controle su nivel de azcar en la sangre todos los das durante el [place]. Haga los controles con la frecuencia que le hayan indicado.  Llame al mdico si el nivel de azcar en la sangre est por encima de las cifras ideales en dosanlisis seguidos. El mdico fijar objetivos de tratamiento personales para usted. Generalmente, los [employer] niveles de Location manager en la sangre deben ser los siguientes:  Antes de las comidas, o despus de no haber comido durante un tiempo prolongado (en ayunas o preprandial): igual o menor que 95 mg/dl (5,3 mmol/l).  Despus de las comidas (posprandial): ? Una hora despus de una comida: igual o menor que 128m/dl (7,834ml/l). ? Dos horas despus de una comida: igual o menor que 1204ml (6,7mm58ml).  Nivel de A1c (hemoglobinaA1c): del 6% al 6,5%. Cmo controlar los niveles altos y bajos de azcaLocation managerla sangre Signos de un nivel alto de azcar en la sangre Un nivel alto de azcar en la sangre se denomina hiperglucemia. Conozca  cules son los signos de un nivel alto de azcaDispensing opticians signos pueden incluir lo siguiente:  Sentir: ? Sed. ? Hambre. ? Mucho cansancio.  Necesidad de orinGarment/textile technologist mayor frecuencia que lo habitual.  Visin borrosa. Signos de un nivel bajo de azcar en la sangre Un nivel bajo de azcar en la sangre se denomina hipoglucemia. Este cuadro ocurre cuando el nivel de azcar en la sangre es igual o menor que 70mg33m(3,9mmol37m. Los signos pueden incluir lo siguiente:  Sentir: ? Hambre. ? Preocupacin o nervios (ansiedad). ? Sudoracin y piel hIntel Corporationnfusin. ? Mareos. ? Somnolencia. ? Ganas de vomitar (nuseas).  Tener: ? Latidos cardacos acelerados. ? Dolor de cabezaNetherlandsmbios en la visin. ? Hormigueo y falta de sensibilidad (entumecimiento) alrededor de la boca, labios o lengua. ? Movimientos espasmdicos que no puede controlar (convulsiones).  Dificultades para hacer lo siguiente: ? Moverse (coordinacin). ? Dormir. ? Desmayos. ? Molestarse con facilidad (irritabilidad). Tratamiento del nivel bajo de azcar en la sangre Para tratar un nivel bajo de azcar en la sangre, ingiera un alimento o una bebida azucarada de inmediato. Si puede pensar con claridad y tragar de manera segura, siga la regla 15/15, que consiste en lo siguiente:  Consuma 15gramos de un hidrato de carbono de accin rpida (carbohidrato). Hable con su mdico acerca de cunto debera consumir.  Algunos hidratos de carbono de accin rpida son: ? Comprimidos de azcar (pastillas de glucosa). Consuma 3o 4pastillas de glucosa. ? De 6 a 8unidades de caramelos duros. ? De 4 a 6onzas (de 120 a 150ml) 77mugo de frutas. ?  De 4 a 6onzas (de 120 a 134m) de refresco comn (no diettico). ? 1 cucharada (163m de miel o azcar.  Contrlese el nivel de azcar en la sangre 1536mtos despus de ingerir el hidrato de carbono.  Si el nivel de azcar en la sangre todava es igual o menor que  20m8m (3,9mmo25m), ingiera nuevamente 15gramos de un hidrato de carbono.  Si el nivel de azcar en la sangre no supera los 20mg/37m3,9mmol/35mdespus de 3intentos, solicite ayuda de inmediato.  Ingiera una comida o una colacin en el transcurso de 1hora despus de que el nivel de azcar en la sangre se haya normalizado. Tratamiento del nivel muy bajo de azcar en la sangre Si el nivel de azcar en la sangre es igual o menor que 54mg/dl48mmol/l)41mignifica que est muy bajo (hipoglucemia grave). Esto es una emergEngineer, maintenance (IT)re a ver si los sntomas desaparecen. Solicite atencin mdica de inmediato. Comunquese con el servicio de emergencias de su localidad (911 en los [place]). Si su nivel de azcar en la sangre es muy bajo y no puede ingerir ningn alimento ni bebida, tal vez deba aplicarse una inyeccin de glucagn. Un familiar o un amigo deben aprender a controlarle el azcar en la sangre y a aplicarle una inyeccin de glucagn. Pregntele al mdico si debe tener un kit de inyecciones de glucagn en su casa. Siga estas indicaciones en su casa: Medicamentos  Aplquese la insulina y tome los medicamentos para la diabetes como se lo hayan indicado.  Si el mdico le indica que se aplique ms o menos insulina, o que tome ms o menos medicamentos, haga exactamente lo que le diga. LandAmerica Financialquede sin insulina o sin medicamentos. Alimentos   Opte por opciones de alimentos saludables. Estos incluyen los siguientes: ? Pollo, pescado, claras de huevos y frijoles. ? Avena, harina integral, trigo burgol, arroz integral, quinua y mijo. ? Frutas y Lambert Modyas frescas. ? Productos lcteos descremados. ? Frutos secos, aguacate, aceite de oliva y aceite de canola.  Consulte a un especialista en alimentacin (nutricionista). Este profesional puede ayudarla a elaborar Paediatric nursede alimentacin adecuado para usted.  Siga las indicaciones del mdico respecto de lo que no puede comer o  beber.  Beba suficiente lquido para mantener Contractororina) de color amarillo plido.  Ingiera refrigerios saludables entre comidas nutritivas.  Lleve un registro de los hidratos de carbono que consume. Para hacerlo, lea las etiquetas de informacin nutricional y aprenda cules son los tamaos de las porciones de los alimentos.  Siga su plan para los das de enfermedad cuando no pueda comer ni beber normalmente. Elabore eUnited Auto el mdico, de modo que est listo para usarlo. Actividad  Haga actividad fsica durante 30minutos34ms por da, o durante el tiempo que el mdico le rViacome.  Hable con el mdico antes de comenzar una rutina de ejercicio o actividad nueva. Es posible que el mdico le indique que haga cambios en: ? La cantidad de insulina qDover Corporation o los medicamentos que toma. ? Cunto debe comer. Estilo de vida  No beba alcohol.  No use productos que contengan tabaco. Estos incluyen cigarrillos, tabaco para mascar y cigarrilloPsychologist, sport and exerciseita ayuda para dejar de fumar, consulte al mdico.  Aprenda cmo sobrellevar el estrs. Si necesita ayuda para lograrlo, consulte al mdico. CuiMeadWestvacodel cuerpo  Mantngase al da con las vacunas (inmunizaciones).  Cepllese los dientes y las encas Rattanlo dental unaArdelia Mems  o ms veces por da.  Vaya al dentista una vez cada 78mses o con ms frecuencia.  Mantenga un peso sTax adviser Indicaciones generales  TDelphide venta libre y los recetados solamente como se lo haya indicado el mdico.  Pregntele al mdico sobre los riesgos de la presin arterial alta en el embarazo (preeclampsia y eclampsia).  Comparta su plan de atencin de la diabetes con: ? Sus compaeros de trabajo o de la escuela. ? LAnadarko Petroleum Corporationcon las que cNelliston  Hgase pruebas de orina para dProduct managerpresencia de cetonas: ? Cuando est enferma. ? Como se lo haya indicado el  mdico.  Lleve consigo una tarjeta, o use un brazalete o una medalla que indique que tiene diabetes.  Concurra a todas las visitas de control como se lo haya indicado el mdico. Esto es importante. Cuidados despus del parto  Hgase controlar el nivel de azcar en la sangre 4 a 12semanas despus del parto.  Hgase controlar si tiene diabetes una o ms veces en el plazo de 3 aos. Preguntas para hacerle al mdico  Es necesario que me rena con uRadio broadcast assistanten el cuidado de la diabetes?  Dnde puedo encontrar un grupo de a62para mujeres con diabetes gestacional? Dnde buscar ms informacin Para obtener ms informacin sobre la diabetes, visite los siguientes sitios web:  American Diabetes Association (Asociacin Estadounidense de la Diabetes): www.diabetes.org  Centers for Disease Control and Prevention (Librarian, academic (Centros para eBuilding surveyory la Prevencin de EArboriculturist: whttp://www.wolf.info/Resumen  Controle su nivel de azcar en la sangre (glucosa) tAlton Haga los controles con la frecuencia que le hayan indicado.  Aplquese la insulina y tome los medicamentos para la diabetes como se lo hayan indicado.  Concurra a todas las visitas de control como se lo haya indicado el mdico. Esto es importante.  Hgase controlar el nivel de azcar en la sangre 4 a 12semanas despus del parto. Esta informacin no tiene cMarine scientistel consejo del mdico. Asegrese de hacerle al mdico cualquier pregunta que tenga. Document Revised: 10/17/2017 Document Reviewed: 10/17/2017 Elsevier Patient Education  [place]

## 2020-02-01 ENCOUNTER — Ambulatory Visit (INDEPENDENT_AMBULATORY_CARE_PROVIDER_SITE_OTHER): Payer: Self-pay | Admitting: Obstetrics and Gynecology

## 2020-02-01 ENCOUNTER — Other Ambulatory Visit: Payer: Self-pay

## 2020-02-01 VITALS — BP 116/73 | HR 89 | Wt 166.0 lb

## 2020-02-01 DIAGNOSIS — O24415 Gestational diabetes mellitus in pregnancy, controlled by oral hypoglycemic drugs: Secondary | ICD-10-CM

## 2020-02-01 DIAGNOSIS — O09899 Supervision of other high risk pregnancies, unspecified trimester: Secondary | ICD-10-CM

## 2020-02-01 DIAGNOSIS — Z789 Other specified health status: Secondary | ICD-10-CM

## 2020-02-01 DIAGNOSIS — Z6832 Body mass index (BMI) 32.0-32.9, adult: Secondary | ICD-10-CM

## 2020-02-01 DIAGNOSIS — O0990 Supervision of high risk pregnancy, unspecified, unspecified trimester: Secondary | ICD-10-CM

## 2020-02-01 DIAGNOSIS — O09299 Supervision of pregnancy with other poor reproductive or obstetric history, unspecified trimester: Secondary | ICD-10-CM

## 2020-02-01 DIAGNOSIS — Z3A26 26 weeks gestation of pregnancy: Secondary | ICD-10-CM

## 2020-02-01 DIAGNOSIS — O09522 Supervision of elderly multigravida, second trimester: Secondary | ICD-10-CM

## 2020-02-01 DIAGNOSIS — Z758 Other problems related to medical facilities and other health care: Secondary | ICD-10-CM

## 2020-02-01 NOTE — Patient Instructions (Signed)
Diabetes mellitus gestacional, cuidados personales Gestational Diabetes Mellitus, Self Care Las mujeres que tienen diabetes gestacional (diabetes mellitus gestacional) deben mantener su nivel de azcar en la sangre (glucosa) dentro de un rango saludable. Es posible hacerlo por medio de lo siguiente:  Alimentacin.  Actividad fsica.  Cambios en el estilo de vida.  Medicamentos o insulina, si es necesario.  [employer] mdicos y de Producer, television/film/video. Si recibe tratamiento para esta afeccin, es posible que ni usted ni su beb en gestacin (feto) se vean afectados. Si no recibe tratamiento, esta afeccin puede causar problemas que pueden ser perjudiciales para usted o su beb en gestacin. Si tiene diabetes gestacional:  Es ms probable que vuelva a tenerla si queda embarazada nuevamente.  Es ms probable que desarrolle diabetes tipo2 en el futuro. Cmo mantenerse informada sobre Retail buyer de azcar en la sangre   Controle su nivel de azcar en la sangre todos los das durante el [place]. Haga los controles con la frecuencia que le hayan indicado.  Llame al mdico si el nivel de azcar en la sangre est por encima de las cifras ideales en dosanlisis seguidos. El mdico fijar objetivos de tratamiento personales para usted. Generalmente, los [employer] niveles de Location manager en la sangre deben ser los siguientes:  Antes de las comidas, o despus de no haber comido durante un tiempo prolongado (en ayunas o preprandial): igual o menor que 95 mg/dl (5,3 mmol/l).  Despus de las comidas (posprandial): ? Una hora despus de una comida: igual o menor que 128m/dl (7,834ml/l). ? Dos horas despus de una comida: igual o menor que 1204ml (6,7mm58ml).  Nivel de A1c (hemoglobinaA1c): del 6% al 6,5%. Cmo controlar los niveles altos y bajos de azcaLocation managerla sangre Signos de un nivel alto de azcar en la sangre Un nivel alto de azcar en la sangre se denomina hiperglucemia. Conozca  cules son los signos de un nivel alto de azcaDispensing opticians signos pueden incluir lo siguiente:  Sentir: ? Sed. ? Hambre. ? Mucho cansancio.  Necesidad de orinGarment/textile technologist mayor frecuencia que lo habitual.  Visin borrosa. Signos de un nivel bajo de azcar en la sangre Un nivel bajo de azcar en la sangre se denomina hipoglucemia. Este cuadro ocurre cuando el nivel de azcar en la sangre es igual o menor que 70mg33m(3,9mmol37m. Los signos pueden incluir lo siguiente:  Sentir: ? Hambre. ? Preocupacin o nervios (ansiedad). ? Sudoracin y piel hIntel Corporationnfusin. ? Mareos. ? Somnolencia. ? Ganas de vomitar (nuseas).  Tener: ? Latidos cardacos acelerados. ? Dolor de cabezaNetherlandsmbios en la visin. ? Hormigueo y falta de sensibilidad (entumecimiento) alrededor de la boca, labios o lengua. ? Movimientos espasmdicos que no puede controlar (convulsiones).  Dificultades para hacer lo siguiente: ? Moverse (coordinacin). ? Dormir. ? Desmayos. ? Molestarse con facilidad (irritabilidad). Tratamiento del nivel bajo de azcar en la sangre Para tratar un nivel bajo de azcar en la sangre, ingiera un alimento o una bebida azucarada de inmediato. Si puede pensar con claridad y tragar de manera segura, siga la regla 15/15, que consiste en lo siguiente:  Consuma 15gramos de un hidrato de carbono de accin rpida (carbohidrato). Hable con su mdico acerca de cunto debera consumir.  Algunos hidratos de carbono de accin rpida son: ? Comprimidos de azcar (pastillas de glucosa). Consuma 3o 4pastillas de glucosa. ? De 6 a 8unidades de caramelos duros. ? De 4 a 6onzas (de 120 a 150ml) 77mugo de frutas. ?  De 4 a 6onzas (de 120 a 134m) de refresco comn (no diettico). ? 1 cucharada (163m de miel o azcar.  Contrlese el nivel de azcar en la sangre 1536mtos despus de ingerir el hidrato de carbono.  Si el nivel de azcar en la sangre todava es igual o menor que  20m8m (3,9mmo25m), ingiera nuevamente 15gramos de un hidrato de carbono.  Si el nivel de azcar en la sangre no supera los 20mg/37m3,9mmol/35mdespus de 3intentos, solicite ayuda de inmediato.  Ingiera una comida o una colacin en el transcurso de 1hora despus de que el nivel de azcar en la sangre se haya normalizado. Tratamiento del nivel muy bajo de azcar en la sangre Si el nivel de azcar en la sangre es igual o menor que 54mg/dl48mmol/l)41mignifica que est muy bajo (hipoglucemia grave). Esto es una emergEngineer, maintenance (IT)re a ver si los sntomas desaparecen. Solicite atencin mdica de inmediato. Comunquese con el servicio de emergencias de su localidad (911 en los [place]). Si su nivel de azcar en la sangre es muy bajo y no puede ingerir ningn alimento ni bebida, tal vez deba aplicarse una inyeccin de glucagn. Un familiar o un amigo deben aprender a controlarle el azcar en la sangre y a aplicarle una inyeccin de glucagn. Pregntele al mdico si debe tener un kit de inyecciones de glucagn en su casa. Siga estas indicaciones en su casa: Medicamentos  Aplquese la insulina y tome los medicamentos para la diabetes como se lo hayan indicado.  Si el mdico le indica que se aplique ms o menos insulina, o que tome ms o menos medicamentos, haga exactamente lo que le diga. LandAmerica Financialquede sin insulina o sin medicamentos. Alimentos   Opte por opciones de alimentos saludables. Estos incluyen los siguientes: ? Pollo, pescado, claras de huevos y frijoles. ? Avena, harina integral, trigo burgol, arroz integral, quinua y mijo. ? Frutas y Lambert Modyas frescas. ? Productos lcteos descremados. ? Frutos secos, aguacate, aceite de oliva y aceite de canola.  Consulte a un especialista en alimentacin (nutricionista). Este profesional puede ayudarla a elaborar Paediatric nursede alimentacin adecuado para usted.  Siga las indicaciones del mdico respecto de lo que no puede comer o  beber.  Beba suficiente lquido para mantener Contractororina) de color amarillo plido.  Ingiera refrigerios saludables entre comidas nutritivas.  Lleve un registro de los hidratos de carbono que consume. Para hacerlo, lea las etiquetas de informacin nutricional y aprenda cules son los tamaos de las porciones de los alimentos.  Siga su plan para los das de enfermedad cuando no pueda comer ni beber normalmente. Elabore eUnited Auto el mdico, de modo que est listo para usarlo. Actividad  Haga actividad fsica durante 30minutos34ms por da, o durante el tiempo que el mdico le rViacome.  Hable con el mdico antes de comenzar una rutina de ejercicio o actividad nueva. Es posible que el mdico le indique que haga cambios en: ? La cantidad de insulina qDover Corporation o los medicamentos que toma. ? Cunto debe comer. Estilo de vida  No beba alcohol.  No use productos que contengan tabaco. Estos incluyen cigarrillos, tabaco para mascar y cigarrilloPsychologist, sport and exerciseita ayuda para dejar de fumar, consulte al mdico.  Aprenda cmo sobrellevar el estrs. Si necesita ayuda para lograrlo, consulte al mdico. CuiMeadWestvacodel cuerpo  Mantngase al da con las vacunas (inmunizaciones).  Cepllese los dientes y las encas Rattanlo dental unaArdelia Mems  o ms veces por da.  Vaya al dentista una vez cada 78mses o con ms frecuencia.  Mantenga un peso sTax adviser Indicaciones generales  TDelphide venta libre y los recetados solamente como se lo haya indicado el mdico.  Pregntele al mdico sobre los riesgos de la presin arterial alta en el embarazo (preeclampsia y eclampsia).  Comparta su plan de atencin de la diabetes con: ? Sus compaeros de trabajo o de la escuela. ? LAnadarko Petroleum Corporationcon las que cNelliston  Hgase pruebas de orina para dProduct managerpresencia de cetonas: ? Cuando est enferma. ? Como se lo haya indicado el  mdico.  Lleve consigo una tarjeta, o use un brazalete o una medalla que indique que tiene diabetes.  Concurra a todas las visitas de control como se lo haya indicado el mdico. Esto es importante. Cuidados despus del parto  Hgase controlar el nivel de azcar en la sangre 4 a 12semanas despus del parto.  Hgase controlar si tiene diabetes una o ms veces en el plazo de 3 aos. Preguntas para hacerle al mdico  Es necesario que me rena con uRadio broadcast assistanten el cuidado de la diabetes?  Dnde puedo encontrar un grupo de a62para mujeres con diabetes gestacional? Dnde buscar ms informacin Para obtener ms informacin sobre la diabetes, visite los siguientes sitios web:  American Diabetes Association (Asociacin Estadounidense de la Diabetes): www.diabetes.org  Centers for Disease Control and Prevention (Librarian, academic (Centros para eBuilding surveyory la Prevencin de EArboriculturist: whttp://www.wolf.info/Resumen  Controle su nivel de azcar en la sangre (glucosa) tAlton Haga los controles con la frecuencia que le hayan indicado.  Aplquese la insulina y tome los medicamentos para la diabetes como se lo hayan indicado.  Concurra a todas las visitas de control como se lo haya indicado el mdico. Esto es importante.  Hgase controlar el nivel de azcar en la sangre 4 a 12semanas despus del parto. Esta informacin no tiene cMarine scientistel consejo del mdico. Asegrese de hacerle al mdico cualquier pregunta que tenga. Document Revised: 10/17/2017 Document Reviewed: 10/17/2017 Elsevier Patient Education  [place]

## 2020-02-01 NOTE — Progress Notes (Signed)
   PRENATAL VISIT NOTE  Subjective:  Aimee Benjamin is a 39 y.o. [phone] at [redacted]w[redacted]d being seen today for ongoing prenatal care.  She is currently monitored for the following issues for this high-risk pregnancy and has Multigravida of advanced maternal age in second trimester; History of cervical incompetence in pregnancy, currently pregnant; Gestational diabetes mellitus (GDM), antepartum; BMI 32.0-32.9,adult; Language barrier; Supervision of high risk pregnancy, antepartum; Umbilical hernia without obstruction and without gangrene; History of gestational diabetes mellitus (GDM); History of preterm delivery, currently pregnant; [redacted] weeks gestation of pregnancy; and [redacted] weeks gestation of pregnancy on their problem list.  Patient doing well with no acute concerns today. She reports no complaints.  Contractions: Not present. Vag. Bleeding: None.  Movement: Present. Denies leaking of fluid.   Excellent blood sugar control with the metformin.  All blood sugars are in range.  The following portions of the patient's history were reviewed and updated as appropriate: allergies, current medications, past family history, past medical history, past social history, past surgical history and problem list. Problem list updated.  Objective:   Vitals:   02/01/20 1510  BP: 116/73  Pulse: 89  Weight: 166 lb (75.3 kg)    Fetal Status: Fetal Heart Rate (bpm): 154 Fundal Height: 29 cm Movement: Present     General:  Alert, oriented and cooperative. Patient is in no acute distress.  Skin: Skin is warm and dry. No rash noted.   Cardiovascular: Normal heart rate noted  Respiratory: Normal respiratory effort, no problems with respiration noted  Abdomen: Soft, gravid, appropriate for gestational age.  Pain/Pressure: Present     Pelvic: Cervical exam deferred        Extremities: Normal range of motion.  Edema: None  Mental Status:  Normal mood and affect. Normal behavior. Normal judgment and thought content.    Assessment and Plan:  Pregnancy: [id number] at [redacted]w[redacted]d  1. Supervision of high risk pregnancy, antepartum   2. Gestational diabetes mellitus (GDM) controlled on oral hypoglycemic drug, antepartum Continue metformin  3. Multigravida of advanced maternal age in second trimester   4. History of cervical incompetence in pregnancy, currently pregnant   5. BMI 32.0-32.9,adult   6. Language barrier Interpreter present  7. History of preterm delivery, currently pregnant No s/sx of preterm contractions  8. [redacted] weeks gestation of pregnancy   Preterm labor symptoms and general obstetric precautions including but not limited to vaginal bleeding, contractions, leaking of fluid and fetal movement were reviewed in detail with the patient.  Please refer to After Visit Summary for other counseling recommendations.   Return in about 2 weeks (around 02/15/2020) for 3rd trim labs, [facility], in person.   Mariel Aloe, MD

## 2020-02-04 ENCOUNTER — Encounter: Payer: Self-pay | Admitting: *Deleted

## 2020-02-19 ENCOUNTER — Encounter: Payer: Self-pay | Admitting: Obstetrics & Gynecology

## 2020-02-19 ENCOUNTER — Other Ambulatory Visit: Payer: Self-pay

## 2020-02-19 ENCOUNTER — Ambulatory Visit (INDEPENDENT_AMBULATORY_CARE_PROVIDER_SITE_OTHER): Payer: Self-pay | Admitting: Obstetrics & Gynecology

## 2020-02-19 VITALS — BP 113/65 | HR 72 | Wt 163.0 lb

## 2020-02-19 DIAGNOSIS — O24415 Gestational diabetes mellitus in pregnancy, controlled by oral hypoglycemic drugs: Secondary | ICD-10-CM

## 2020-02-19 DIAGNOSIS — O3662X0 Maternal care for excessive fetal growth, second trimester, not applicable or unspecified: Secondary | ICD-10-CM

## 2020-02-19 DIAGNOSIS — O0990 Supervision of high risk pregnancy, unspecified, unspecified trimester: Secondary | ICD-10-CM

## 2020-02-19 DIAGNOSIS — O09523 Supervision of elderly multigravida, third trimester: Secondary | ICD-10-CM

## 2020-02-19 DIAGNOSIS — Z3A26 26 weeks gestation of pregnancy: Secondary | ICD-10-CM

## 2020-02-19 DIAGNOSIS — Z23 Encounter for immunization: Secondary | ICD-10-CM

## 2020-02-19 LAB — POCT URINALYSIS DIP (DEVICE)
Bilirubin Urine: NEGATIVE
Glucose, UA: NEGATIVE mg/dL
Hgb urine dipstick: NEGATIVE
Ketones, ur: NEGATIVE mg/dL
Nitrite: NEGATIVE
Protein, ur: NEGATIVE mg/dL
Specific Gravity, Urine: 1.02 (ref 1.005–1.030)
Urobilinogen, UA: 0.2 mg/dL (ref 0.0–1.0)
pH: 7 (ref 5.0–8.0)

## 2020-02-19 NOTE — Progress Notes (Addendum)
   PRENATAL VISIT NOTE  Subjective:  Aimee Benjamin is a 39 y.o. [phone] at [redacted]w[redacted]d being seen today for ongoing prenatal care.  She is currently monitored for the following issues for this high-risk pregnancy and has Multigravida of advanced maternal age in third trimester; History of cervical incompetence in pregnancy, currently pregnant; Gestational diabetes mellitus (GDM), antepartum; BMI 32.0-32.9,adult; Language barrier; Supervision of high risk pregnancy, antepartum; Umbilical hernia without obstruction and without gangrene; History of gestational diabetes mellitus (GDM); and History of preterm delivery, currently pregnant on their problem list.  Patient reports no complaints.  Contractions: Not present. Vag. Bleeding: None.  Movement: Present. Denies leaking of fluid.   The following portions of the patient's history were reviewed and updated as appropriate: allergies, current medications, past family history, past medical history, past social history, past surgical history and problem list.   Objective:   Vitals:   02/19/20 1014  BP: 113/65  Pulse: 72  Weight: 163 lb (73.9 kg)    Fetal Status: Fetal Heart Rate (bpm): 155   Movement: Present     General:  Alert, oriented and cooperative. Patient is in no acute distress.  Skin: Skin is warm and dry. No rash noted.   Cardiovascular: Normal heart rate noted  Respiratory: Normal respiratory effort, no problems with respiration noted  Abdomen: Soft, gravid, appropriate for gestational age.  Pain/Pressure: Absent     Pelvic: Cervical exam deferred        Extremities: Normal range of motion.  Edema: None  Mental Status: Normal mood and affect. Normal behavior. Normal judgment and thought content.   Imaging: 02/01/20 EFW 649g/3% at [facility], EDC was 05/26/2020 by [redacted]w[redacted]d week scan (different from LMP related [facility] 05/07/2020)    Assessment and Plan:  Pregnancy: [id number] at [redacted]w[redacted]d 1. Gestational diabetes mellitus (GDM) controlled on oral  hypoglycemic drug, antepartum Blood sugars reviewed on paper and ALL values are between 80-90!  Even postprandial. Denies hypoglycemic symptoms.  Told to bring glucometer with her next visit. Continue Metformin 500 mg po qhs. Other routine third trimester labs checked today.  On review of [facility] scan : 02/01/20 EFW 649g/3% at [facility], EDC was 05/26/2020 by [redacted]w[redacted]d week scan (different from LMP related [facility] 05/07/2020). Given this concern for IUGR and discrepancy with dates,  MFM scan ordered.  Will follow up results and manage accordingly.  - CBC - RPR - HIV Antibody (routine testing w rflx) - Comprehensive metabolic panel - Hemoglobin A1C - [place] MFM OB DETAIL +14 WK; Future  2. Concern about small for gestational age fetus affecting management of mother, third trimester, not applicable or unspecified fetus See above, MFM scan ordered. - [place] MFM OB DETAIL +14 WK; Future  3. Multigravida of advanced maternal age in third trimester Growth scan ordered - [place] MFM OB DETAIL +14 WK; Future  4. [redacted] weeks gestation of pregnancy 5. Supervision of high risk pregnancy, antepartum Already received Flu vaccine.  Declines COVID vaccine. Tdap given today. - Tdap vaccine greater than or equal to 7yo IM Preterm labor symptoms and general obstetric precautions including but not limited to vaginal bleeding, contractions, leaking of fluid and fetal movement were reviewed in detail with the patient. Please refer to After Visit Summary for other counseling recommendations.   Return in about 2 weeks (around 03/04/2020) for OFFICE OB VISIT (MD only).  No future appointments.  Jaynie Collins, MD

## 2020-02-19 NOTE — Patient Instructions (Signed)
Tercer trimestre de embarazo Third Trimester of Pregnancy El tercer trimestre comprende desde la semana28 hasta la semana40 (desde el mes7 hasta el mes9). El tercer trimestre es un perodo en el que el beb en gestacin (feto) crece rpidamente. Hacia el final del noveno mes, el feto mide alrededor de 20pulgadas (45cm) de largo y pesa entre 6 y 10 libras (2,700 y 4,500kg). Cambios en el cuerpo durante el tercer trimestre Su organismo continuar atravesando por muchos cambios durante el embarazo. Estos cambios varan de una mujer a otra. Durante el tercer trimestre:  Seguir aumentando de peso. Es de esperar que aumente entre 25 y 35libras (11 y 16kg) hacia el final del embarazo.  Podrn aparecer las primeras estras en las caderas, el abdomen y las mamas.  Puede tener necesidad de orinar con ms frecuencia porque el feto baja hacia la pelvis y ejerce presin sobre la vejiga.  Puede desarrollar o continuar teniendo acidez estomacal. Esto se debe a que el aumento de las hormonas hace que los msculos en el tubo digestivo trabajen ms lentamente.  Puede desarrollar o continuar teniendo estreimiento debido a que el aumento de las hormonas ralentiza la digestin y hace que los msculos que empujan los desechos a travs de los intestinos se relajen.  Puede desarrollar hemorroides. Estas son venas hinchadas (venas varicosas) en el recto que pueden causar picazn o dolor.  Puede desarrollar venas hinchadas y abultadas (venas varicosas) en las piernas.  Puede presentar ms dolor en la pelvis, la espalda o los muslos. Esto se debe al aumento de peso y al aumento de las hormonas que relajan las articulaciones.  Tal vez haya cambios en el cabello. Esto cambios pueden incluir su engrosamiento, crecimiento rpido y cambios en la textura. Adems, a algunas mujeres se les cae el cabello durante o despus del embarazo, o tienen el cabello seco o fino. Lo ms probable es que el cabello se le normalice  despus del nacimiento del beb.  Sus pechos seguirn creciendo y se pondrn cada vez ms sensibles. Un lquido amarillo (calostro) puede salir de sus pechos. Esta es la primera leche que usted produce para su beb.  El ombligo puede salir hacia afuera.  Puede observar que se le hinchan las manos, el rostro o los tobillos.  Puede presentar un aumento del hormigueo o entumecimiento en las manos, brazos y piernas. La piel de su vientre tambin puede sentirse entumecida.  Puede sentir que le falta el aire debido a que se expande el tero.  Puede tener ms problemas para dormir. Esto puede deberse al tamao de su vientre, una mayor necesidad de orinar y un aumento en el metabolismo de su cuerpo.  Puede notar que el feto "baja" o lo siente ms bajo, en el abdomen (aligeramiento).  Puede tener un aumento de la secrecin vaginal.  Puede notar que las articulaciones se sienten flojas y puede sentir dolor alrededor del hueso plvico. Qu debe esperar en las visitas prenatales Le harn exmenes prenatales cada 2semanas hasta la semana36. A partir de ese momento le harn los exmenes semanales. Durante una visita prenatal de rutina:  La pesarn para asegurarse de que usted y el beb estn creciendo normalmente.  Le tomarn la presin arterial.  Le medirn el abdomen para controlar el desarrollo del beb.  Se escucharn los latidos cardacos fetales.  Se evaluarn los resultados de los estudios solicitados en visitas anteriores.  Le revisarn el cuello del tero cuando est prxima la fecha de parto para controlar si el cuello uterino   se ha afinado o adelgazado (borrado).  Le harn una prueba de estreptococos del grupo B. Esto sucede entre las semanas 35 y 37. El mdico puede preguntarle lo siguiente:  Cmo le gustara que fuera el parto.  Cmo se siente.  Si siente los movimientos del beb.  Si ha tenido sntomas anormales, como prdida de lquido, sangrado, dolores de cabeza  intensos o clicos abdominales.  Si est consumiendo algn producto que contenga tabaco, como cigarrillos, tabaco de mascar y cigarrillos electrnicos.  Si tiene alguna pregunta. Otros exmenes o estudios de deteccin que pueden realizarse durante el tercer trimestre incluyen lo siguiente:  Anlisis de sangre para controlar los niveles de hierro (anemia).  Controles fetales para determinar su salud, nivel de actividad y crecimiento. Si tiene alguna enfermedad o hay problemas durante el embarazo, le harn estudios.  Prueba sin estrs. Esta prueba verifica la salud de su beb y se utiliza para detectar signos de problemas, tales como si el beb no est recibiendo suficiente oxgeno. Durante esta prueba, se coloca un cinturn alrededor de su vientre. Al moverse el beb, se controla su frecuencia cardaca. Qu es el falso trabajo de parto? El falso trabajo de parto es una afeccin en la que se sienten pequeos e irregulares espasmos de los msculos del tero (contracciones) que generalmente desaparecen al hacer reposo, cambiar de posicin o al beber agua. Estas contracciones se llaman contracciones de Braxton Hicks. Las contracciones pueden durar horas, das o incluso semanas, antes de que el verdadero trabajo de parto se inicie. Si las contracciones ocurren a intervalos regulares, se vuelven ms frecuentes, aumentan en intensidad o se vuelven dolorosas, debera ver al mdico.  Cules son los signos del trabajo de parto?  Clicos abdominales.  Contracciones regulares que comienzan en intervalos de 10 minutos y se vuelven ms fuertes y ms frecuentes con el tiempo.  Contracciones que comienzan en la parte superior del tero y se extienden hacia abajo, a la zona inferior del abdomen y la espalda.  Aumento de la presin en la pelvis y dolor latente en la espalda.  Una secrecin de mucosidad acuosa o con sangre que sale de la vagina.  Prdida de lquido amnitico. Esto tambin se conoce como  "ruptura de la bolsa de las aguas". Esto puede ser un chorro o un goteo constante y lento de lquido. Informe a su mdico si tiene un color u olor extrao. Si tiene alguno de estos signos, llame a su mdico de inmediato, incluso si es antes de la fecha de parto. Siga estas indicaciones en su casa: Medicamentos  Siga las indicaciones del mdico en relacin con el uso de medicamentos. Durante el embarazo, hay medicamentos que pueden tomarse y otros que no.  Tome vitaminas prenatales que contengan por lo menos 600microgramos (?g) de cido flico.  Si est estreida, tome un laxante suave, si el mdico lo autoriza. Qu debe comer y beber   Lleve una dieta equilibrada que incluya gran cantidad de frutas y verduras frescas, cereales integrales, buenas fuentes de protenas como carnes magras, huevos o tofu, y lcteos descremados. El mdico la ayudar a determinar la cantidad de peso que puede aumentar.  No coma carne cruda ni quesos sin cocinar. Estos elementos contienen grmenes que pueden causar defectos congnitos en el beb.  Si no consume muchos alimentos con calcio, hable con su mdico sobre si debera tomar un suplemento diario de calcio.  La ingesta diaria de cuatro o cinco comidas pequeas en lugar de tres comidas abundantes.  Limite el   consumo de alimentos con alto contenido de grasas y azcares procesados, como alimentos fritos o dulces.  Para evitar el estreimiento: ? Bebe suficiente lquido para mantener la orina clara o de color amarillo plido. ? Consuma alimentos ricos en fibra, como frutas y verduras frescas, cereales integrales y frijoles. Actividad  Haga ejercicio solamente como se lo haya indicado el mdico. La mayora de las mujeres pueden continuar su rutina de ejercicios durante el embarazo. Intente realizar como mnimo 30minutos de actividad fsica por lo menos 5das a la semana. Deje de hacer ejercicio si experimenta contracciones uterinas.  Evite levantar pesos  excesivos.  No haga ejercicio en condiciones de calor o humedad extremas, o a grandes alturas.  Use zapatos cmodos de tacn bajo.  Adopte una buena postura.  Puede seguir teniendo relaciones sexuales, excepto que el mdico le diga lo contrario. Alivio del dolor y del malestar  Haga pausas frecuentes y descanse con las piernas elevadas si tiene calambres en las piernas o dolor en la zona lumbar.  Dese baos de asiento con agua tibia para aliviar el dolor o las molestias causadas por las hemorroides. Use una crema para las hemorroides si el mdico la autoriza.  Use un sostn que le brinde buen soporte para prevenir las molestias causadas por la sensibilidad en los pechos.  Si tiene venas varicosas: ? Use pantimedias que brinden soporte o medias de compresin como se lo haya indicado el mdico. ? Eleve los pies durante 15minutos, 3 o 4veces por da. Cuidados prenatales  Escriba sus preguntas. Llvelas cuando concurra a las visitas prenatales.  Concurra a todas las visitas prenatales tal como se lo haya indicado el mdico. Esto es importante. Seguridad  Use el cinturn de seguridad en todo momento mientras conduce.  Haga una lista de los nmeros de telfono de emergencia, que incluya los nmeros de telfono de familiares, amigos, el hospital y los departamentos de polica y bomberos. Instrucciones generales  Evite el contacto con las bandejas sanitarias de los gatos y la tierra que estos animales usan. Estos elementos contienen grmenes que pueden causar defectos congnitos en el beb. Si tiene un gato, pdale a alguien que limpie la caja de arena por usted.  No haga viajes largos excepto que sea absolutamente necesario y solo con la autorizacin de su mdico.  No se d baos de inmersin en agua caliente, baos turcos ni saunas.  No beber alcohol.  No consuma ningn producto que contenga nicotina o tabaco, como cigarrillos y cigarrillos electrnicos. Si necesita ayuda para  dejar de fumar, consulte al mdico.  No use hierbas medicinales ni medicamentos que no le hayan recetado. Estas sustancias qumicas afectan la formacin y el desarrollo del beb.  No se haga duchas vaginales ni use tampones o toallas higinicas perfumadas.  No mantenga las piernas cruzadas durante largos periodos de tiempo.  Para prepararse para la llegada de su beb: ? Tome clases prenatales para entender, practicar, y hacer preguntas sobre el trabajo de parto y el parto. ? Haga un ensayo de la partida al hospital. ? Visite el hospital y recorra el rea de maternidad. ? Pida un permiso de maternidad o paternidad a sus empleadores. ? Organice para que algn familiar o amigo cuide a sus mascotas mientras usted est en el hospital. ? Compre un asiento de seguridad orientado hacia atrs, y asegrese de saber cmo instalarlo en su automvil. ? Prepare el bolso que llevar al hospital. ? Prepare la habitacin del beb. Asegrese de quitar todas las almohadas y animales   de peluche de la cuna del beb para evitar la asfixia.  Visite a su dentista si no lo ha hecho durante el embarazo. Use un cepillo de dientes blando para higienizarse los dientes y psese el hilo dental con suavidad. Comunquese con un mdico si:  No est segura de que est en trabajo de parto o de que ha roto la bolsa de las aguas.  Se siente mareada.  Siente clicos leves, presin en la pelvis o dolor persistente en el abdomen.  Siente dolor en la parte inferior de la espalda.  Tiene nuseas, vmitos o diarrea persistentes.  Observa una secrecin vaginal inusual o con mal olor.  Siente dolor al orinar. Solicite ayuda de inmediato si:  Rompe la bolsa de las aguas antes de la semana 37.  Tiene contracciones regulares en intervalos de menos de 5 minutos antes de la semana 37.  Tiene fiebre.  Tiene una prdida de lquido por la vagina.  Tiene sangrado o pequeas prdidas vaginales.  Tiene dolor o clicos  abdominales intensos.  Baja de peso o sube de peso rpidamente.  Tiene dificultad para respirar y siente dolor de pecho.  Sbitamente se le hinchan mucho el rostro, las manos, los tobillos, los pies o las piernas.  Su beb se mueve menos de 10 veces en 2 horas.  Siente un dolor de cabeza intenso que no se alivia al tomar medicamentos.  Nota cambios en la visin. Resumen  El tercer trimestre comprende desde la semana28 hasta la semana40, es decir, desde el mes7 hasta el mes9. El tercer trimestre es un perodo en el que el beb en gestacin (feto) crece rpidamente.  Durante el tercer trimestre, su incomodidad puede aumentar a medida que usted y su beb continan aumentando de peso. Es posible que tenga dolor abdominal, en las piernas y en la espalda, problemas para dormir y una mayor necesidad de orinar.  Durante el tercer trimestre, sus pechos seguirn creciendo y se pondrn cada vez ms sensibles. Un lquido amarillo (calostro) puede salir de sus pechos. Esta es la primera leche que usted produce para su beb.  El falso trabajo de parto es una afeccin en la que se sienten pequeos e irregulares espasmos de los msculos del tero (contracciones) que a la larga desaparecen. Estas contracciones se llaman contracciones de Braxton Hicks. Las contracciones pueden durar horas, das o incluso semanas, antes de que el verdadero trabajo de parto se inicie.  Los signos del trabajo de parto pueden incluir: calambres abdominales; contracciones regulares que comienzan en intervalos de 10 minutos y se vuelven ms fuertes y ms frecuentes con el tiempo; una secrecin de mucosidad acuosa o con sangre que sale de la vagina; aumento de la presin en la pelvis y dolor latente en la espalda; y prdida de lquido amnitico. Esta informacin no tiene como fin reemplazar el consejo del mdico. Asegrese de hacerle al mdico cualquier pregunta que tenga. Document Revised: 07/17/2016 Document Reviewed:  07/17/2016 Elsevier Patient Education  2020 [employer]  

## 2020-02-20 LAB — COMPREHENSIVE METABOLIC PANEL
ALT: 29 IU/L (ref 0–32)
AST: 30 IU/L (ref 0–40)
Albumin/Globulin Ratio: 1.2 (ref 1.2–2.2)
Albumin: 3.8 g/dL (ref 3.8–4.8)
Alkaline Phosphatase: 93 IU/L (ref 44–121)
BUN/Creatinine Ratio: 24 — ABNORMAL HIGH (ref 9–23)
BUN: 9 mg/dL (ref 6–24)
Bilirubin Total: 0.2 mg/dL (ref 0.0–1.2)
CO2: 19 mmol/L — ABNORMAL LOW (ref 20–29)
Calcium: 8.7 mg/dL (ref 8.7–10.2)
Chloride: 105 mmol/L (ref 96–106)
Creatinine, Ser: 0.38 mg/dL — ABNORMAL LOW (ref 0.57–1.00)
GFR calc Af Amer: 153 mL/min/{1.73_m2} (ref >59)
GFR calc non Af Amer: 133 mL/min/{1.73_m2} (ref >59)
Globulin, Total: 3.2 g/dL (ref 1.5–4.5)
Glucose: 79 mg/dL (ref 65–99)
Potassium: 4.3 mmol/L (ref 3.5–5.2)
Sodium: 138 mmol/L (ref 134–144)
Total Protein: 7 g/dL (ref 6.0–8.5)

## 2020-02-20 LAB — CBC
Hematocrit: 37.8 % (ref 34.0–46.6)
Hemoglobin: 12.7 g/dL (ref 11.1–15.9)
MCH: 30.4 pg (ref 26.6–33.0)
MCHC: 33.6 g/dL (ref 31.5–35.7)
MCV: 90 fL (ref 79–97)
Platelets: 192 10*3/uL (ref 150–450)
RBC: 4.18 x10E6/uL (ref 3.77–5.28)
RDW: 12.3 % (ref 11.7–15.4)
WBC: 8.9 10*3/uL (ref 3.4–10.8)

## 2020-02-20 LAB — HIV ANTIBODY (ROUTINE TESTING W REFLEX): HIV Screen 4th Generation wRfx: NONREACTIVE

## 2020-02-20 LAB — RPR: RPR Ser Ql: NONREACTIVE

## 2020-02-22 ENCOUNTER — Ambulatory Visit: Payer: Self-pay | Attending: Obstetrics & Gynecology

## 2020-02-22 ENCOUNTER — Encounter: Payer: Self-pay | Admitting: *Deleted

## 2020-02-22 ENCOUNTER — Ambulatory Visit: Payer: Self-pay | Admitting: *Deleted

## 2020-02-22 ENCOUNTER — Other Ambulatory Visit: Payer: Self-pay | Admitting: *Deleted

## 2020-02-22 ENCOUNTER — Other Ambulatory Visit: Payer: Self-pay

## 2020-02-22 DIAGNOSIS — O365930 Maternal care for other known or suspected poor fetal growth, third trimester, not applicable or unspecified: Secondary | ICD-10-CM | POA: Insufficient documentation

## 2020-02-22 DIAGNOSIS — O24415 Gestational diabetes mellitus in pregnancy, controlled by oral hypoglycemic drugs: Secondary | ICD-10-CM | POA: Insufficient documentation

## 2020-02-22 DIAGNOSIS — Z3A28 28 weeks gestation of pregnancy: Secondary | ICD-10-CM | POA: Insufficient documentation

## 2020-02-22 DIAGNOSIS — O0990 Supervision of high risk pregnancy, unspecified, unspecified trimester: Secondary | ICD-10-CM

## 2020-02-22 DIAGNOSIS — O09523 Supervision of elderly multigravida, third trimester: Secondary | ICD-10-CM | POA: Insufficient documentation

## 2020-02-22 DIAGNOSIS — O3662X0 Maternal care for excessive fetal growth, second trimester, not applicable or unspecified: Secondary | ICD-10-CM | POA: Insufficient documentation

## 2020-02-22 NOTE — Progress Notes (Signed)
Ultrasound Addendum  [place] MFM OB DETAIL +14 WK  Result Date: 02/22/2020 ----------------------------------------------------------------------  OBSTETRICS REPORT                       (Signed Final 02/22/2020 10:01 am) ---------------------------------------------------------------------- Patient Info  ID #:       [id number]                          D.O.B.:  1980-08-06 (39 yrs)  Name:       Aimee Benjamin               Visit Date: 02/22/2020 08:09 am ---------------------------------------------------------------------- Performed By  Attending:        Noralee Space MD        Ref. Address:     [address]                                                             [place], [place]                                                             [address]  Performed By:     Sandi Mealy        [facility]  Referred By:      [facility] ---------------------------------------------------------------------- Orders  #  Description                           Code        Ordered By  1  [place] MFM OB DETAIL +14 WK               19147.82    Jaynie Collins ----------------------------------------------------------------------  #  Order #  Accession #                Episode #  1  [id number]                   [id number]                 [id number] ---------------------------------------------------------------------- Indications  Gestational diabetes in pregnancy,             O24.415  controlled by oral hypoglycemic drugs  (metformin)  Small for gestational age fetus affecting      O36.5990  management of mother  Advanced maternal age multigravida 65+,        O37.523  third trimester (39yo)  Poor obstetric history: Hx of preterm          O09.219   delivery, currently pregnant  Poor obstetrical history: 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  Obesity complicating pregnancy, third          O99.213  trimester (BMI 30)  Encounter for antenatal screening for          Z36.3  malformations  [redacted] weeks gestation of pregnancy                Z3A.26 ---------------------------------------------------------------------- Fetal Evaluation  Num Of Fetuses:         1  Fetal Heart Rate(bpm):  132  Cardiac Activity:       Observed  Presentation:           Cephalic  Placenta:               Posterior  P. Cord Insertion:      Visualized  Amniotic Fluid  AFI FV:      Within normal limits  AFI Sum(cm)     %Tile       Largest Pocket(cm)  16.1            58          6.7  RUQ(cm)       RLQ(cm)       LUQ(cm)        LLQ(cm)  3.1           3.2           3.1            6.7 ---------------------------------------------------------------------- Biometry  BPD:      70.5  mm     G. Age:  28w 2d         87  %    CI:        76.38   %    70 - 86                                                          FL/HC:      20.3   %    18.6 - 20.4  HC:      255.6  mm     G. Age:  27w 5d         59  %    HC/AC:      1.00        1.05 - 1.21  AC:      256.5  mm     G. Age:  29w 6d  99  %    FL/BPD:     73.6   %    71 - 87  FL:       51.9  mm     G. Age:  27w 5d         65  %    FL/AC:      20.2   %    20 - 24  HUM:      48.3  mm     G. Age:  28w 2d         82  %  CER:      32.5  mm     G. Age:  27w 6d         88  %  LV:        4.2  mm  CM:        4.5  mm  Est. FW:    1289  gm    2 lb 13 oz      98  % ---------------------------------------------------------------------- OB History  Gravidity:    9         Term:   3  Living:       3 ---------------------------------------------------------------------- Gestational Age  LMP:           29w 2d        Date:  08/01/19                 EDD:   05/07/20  U/S Today:     28w 3d                                        EDD:   05/13/20  Best:          26w 5d      Det. ByMarcella Dubs         EDD:   05/25/20                                      (01/11/20) ---------------------------------------------------------------------- Anatomy  Cranium:               Appears normal         LVOT:                   Appears normal  Cavum:                 Appears normal         Aortic Arch:            Appears normal  Ventricles:            Appears normal         Ductal Arch:            Not well visualized  Choroid Plexus:        Appears normal         Diaphragm:              Appears normal  Cerebellum:            Appears normal         Stomach:                Appears normal, left  sided  Posterior Fossa:       Appears normal         Abdomen:                Appears normal  Nuchal Fold:           Not applicable (>20    Abdominal Wall:         Appears nml (cord                         wks GA)                                        insert, abd wall)  Face:                  Appears normal         Cord Vessels:           Appears normal (3                         (orbits and profile)                           vessel cord)  Lips:                  Not well visualized    Kidneys:                Appear normal  Palate:                Not well visualized    Bladder:                Appears normal  Thoracic:              Appears normal         Spine:                  Limited views                                                                        appear normal  Heart:                 Appears normal         Upper Extremities:      Not well visualized                         (4CH, axis, and                         situs)  RVOT:                  Not well visualized    Lower Extremities:      Appears normal  Other:  Nasal bone visualized. Fetus appears to be a female. Technically          difficult due to fetal position. ---------------------------------------------------------------------- Impression  G9 P3. Patient had an  ultrasound  at [facility] and  fetal growth restriction was suspected. On ultrasound  performed on 02/01/2020, the estimated fetal weight was at  the 3rd percentile.  Her EDD of 05/07/2020 was established by her LMP date. On  first ultrasound performed on 01/11/2020, fetal biometry was  consistent with 20 weeks and 5 days gestation. EDD based  on that biometry would be 05/25/2020, which is 20 days  behind her LMP established EDD.  Patient has gestational diabetes and takes Metformin for  control. Her most recent hemoglobin A1c was 5.9%.  Obstetric history significant for three previous term deliveries  and she had cerclage placed in two pregnancies.  Consistent with ACOG recommendations, the estimated due  date should be dated by first ultrasound (01/11/20) if it differs  from LMP date by more than 15 days. She does not give  history of fetal growth restriction in her previous pregnancies  and all her children weighed 7 lbs or more at birth.  I recommend assigning her EDD at 05/25/2020.  On today's ultrasound, the estimated fetal weight is at the  98th percentile. Amniotic fluid is normal and good fetal  activity seen. Fetal anatomical survey appears normal but  limited by advanced gestational age and maternal body  habitus.  I counseled the patient with help of Spanish language  interpreter present in the room. ---------------------------------------------------------------------- Recommendations  -An appointment was made for her to return in 3 weeks for  fetal growth assessment (to check interval growth).  -BPP in 5 weeks and then weekly till delivery. ----------------------------------------------------------------------                  Noralee Space, MD Electronically Signed Final Report   02/22/2020 10:01 am ----------------------------------------------------------------------  EDC changed as recommended. Will continue with scans, antenatal testing and prenatal care as recommended.   Jaynie Collins, MD

## 2020-02-22 NOTE — Addendum Note (Signed)
Addended by: Jaynie Collins A on: 02/22/2020 10:44 AM   Modules accepted: Orders

## 2020-02-23 LAB — HEMOGLOBIN A1C
Est. average glucose Bld gHb Est-mCnc: 103 mg/dL
Hgb A1c MFr Bld: 5.2 % (ref 4.8–5.6)

## 2020-02-23 LAB — SPECIMEN STATUS REPORT

## 2020-03-03 ENCOUNTER — Other Ambulatory Visit: Payer: Self-pay

## 2020-03-03 ENCOUNTER — Telehealth: Payer: Self-pay

## 2020-03-03 ENCOUNTER — Other Ambulatory Visit (HOSPITAL_COMMUNITY)
Admission: RE | Admit: 2020-03-03 | Discharge: 2020-03-03 | Disposition: A | Discharge status: Home / Self Care | Payer: Self-pay | Source: Ambulatory Visit | Attending: Women's Health | Admitting: Women's Health

## 2020-03-03 ENCOUNTER — Ambulatory Visit (INDEPENDENT_AMBULATORY_CARE_PROVIDER_SITE_OTHER): Payer: Self-pay | Admitting: Women's Health

## 2020-03-03 VITALS — BP 115/78 | HR 82 | Wt 168.0 lb

## 2020-03-03 DIAGNOSIS — Z789 Other specified health status: Secondary | ICD-10-CM

## 2020-03-03 DIAGNOSIS — O3662X0 Maternal care for excessive fetal growth, second trimester, not applicable or unspecified: Secondary | ICD-10-CM

## 2020-03-03 DIAGNOSIS — O09899 Supervision of other high risk pregnancies, unspecified trimester: Secondary | ICD-10-CM

## 2020-03-03 DIAGNOSIS — O24415 Gestational diabetes mellitus in pregnancy, controlled by oral hypoglycemic drugs: Secondary | ICD-10-CM

## 2020-03-03 DIAGNOSIS — O0990 Supervision of high risk pregnancy, unspecified, unspecified trimester: Secondary | ICD-10-CM

## 2020-03-03 DIAGNOSIS — O4700 False labor before 37 completed weeks of gestation, unspecified trimester: Secondary | ICD-10-CM

## 2020-03-03 DIAGNOSIS — O09299 Supervision of pregnancy with other poor reproductive or obstetric history, unspecified trimester: Secondary | ICD-10-CM

## 2020-03-03 DIAGNOSIS — Z603 Acculturation difficulty: Secondary | ICD-10-CM

## 2020-03-03 DIAGNOSIS — O09523 Supervision of elderly multigravida, third trimester: Secondary | ICD-10-CM

## 2020-03-03 LAB — FETAL FIBRONECTIN: Fetal Fibronectin: NEGATIVE

## 2020-03-03 NOTE — Telephone Encounter (Signed)
Called Pt using [address] [name] id # [phone] to advise of her FFN results showed Negative. Pt verbalized understanding.

## 2020-03-03 NOTE — Progress Notes (Signed)
Subjective:  Aimee Benjamin is a 39 y.o. [phone] at [redacted]w[redacted]d being seen today for ongoing prenatal care.  She is currently monitored for the following issues for this high-risk pregnancy and has Multigravida of advanced maternal age in third trimester; History of cervical incompetence in pregnancy, currently pregnant; Gestational diabetes mellitus (GDM), antepartum; BMI 32.0-32.9,adult; Language barrier; Supervision of high risk pregnancy, antepartum; Umbilical hernia without obstruction and without gangrene; History of preterm delivery, currently pregnant; and Excessive fetal growth affecting management of mother in second trimester, antepartum on their problem list.  Patient reports contractions since 3 days, about 6 per day.  Contractions: Irritability. Vag. Bleeding: None.  Movement: Present. Denies leaking of fluid.   The following portions of the patient's history were reviewed and updated as appropriate: allergies, current medications, past family history, past medical history, past social history, past surgical history and problem list. Problem list updated.  Objective:   Vitals:   03/03/20 1103  BP: 115/78  Pulse: 82  Weight: 168 lb (76.2 kg)    Fetal Status: Fetal Heart Rate (bpm): 160   Movement: Present     General:  Alert, oriented and cooperative. Patient is in no acute distress.  Skin: Skin is warm and dry. No rash noted.   Cardiovascular: Normal heart rate noted  Respiratory: Normal respiratory effort, no problems with respiration noted  Abdomen: Soft, gravid, appropriate for gestational age. Pain/Pressure: Present     Pelvic: Vag. Bleeding: None     Cervical exam performed        Extremities: Normal range of motion.  Edema: None  Mental Status: Normal mood and affect. Normal behavior. Normal judgment and thought content.   Urinalysis:      Assessment and Plan:  Pregnancy: [id number] at [redacted]w[redacted]d  1. Supervision of high risk pregnancy, antepartum  2. Excessive fetal growth  affecting management of pregnancy in second trimester, single or unspecified fetus -02/22/20 [redacted]w[redacted]d  EFW 1289g/98%, AC 99% -next [place] scheduled 03/14/2020  3. History of preterm delivery, currently pregnant -pt was told too late for [facility], so did not start  4. Language barrier -Engineer, structural used for entire visit  5. Multigravida of advanced maternal age in third trimester -next growth scan 03/14/2020 -age 38 at delivery  6. History of cervical incompetence in pregnancy, currently pregnant -CL not performed this pregnancy -pt refused cerclage  7. Gestational diabetes mellitus (GDM) controlled on oral hypoglycemic drug, antepartum -on Metformin 500 PO QHS -fasting 88-98, fasting elevated x3@98  -2hr 80-104, no elevations -watch BS next visit, increased Metformin to 500mg  PO BID  8. Preterm contractions - cervix today closed, with recheck by Dr. - Urine Culture - Cervicovaginal ancillary only( [place]) - FFN STAT today, discussed may need betamethasone if positive, will call with results  Preterm labor symptoms and general obstetric precautions including but not limited to vaginal bleeding, contractions, leaking of fluid and fetal movement were reviewed in detail with the patient. I discussed the assessment and treatment plan with the patient. The patient was provided an opportunity to ask questions and all were answered. The patient agreed with the plan and demonstrated an understanding of the instructions. The patient was advised to call back or seek an in-person office evaluation/go to MAU at [facility] for any urgent or concerning symptoms. Please refer to After Visit Summary for other counseling recommendations.  Return in about 2 weeks (around 03/17/2020) for HOB/MD ONLY.   Magaby Rumberger, 03/19/2020, NP

## 2020-03-03 NOTE — Patient Instructions (Addendum)
Maternity Assessment Unit (MAU)  The Maternity Assessment Unit (MAU) is located at the [facility] and [place] at [facility]. The address is: [address], [place], [place], [place] [address]. Please see map below for additional directions.    The Maternity Assessment Unit is designed to help you during your pregnancy, and for up to 6 weeks after delivery, with any pregnancy- or postpartum-related emergencies, if you think you are in labor, or if your water has broken. For example, if you experience nausea and vomiting, vaginal bleeding, severe abdominal or pelvic pain, elevated blood pressure or other problems related to your pregnancy or postpartum time, please come to the Maternity Assessment Unit for assistance.        Informacin sobre parto y [place] de parto prematuros (Preterm Labor and Birth Information) La duracin de un embarazo normal es de 69 a 41semanas. Se llama trabajo de parto prematuro cuando se inicia antes de las 37semanas de [place]. [place]? Existen mayores probabilidades de trabajo de parto prematuro en mujeres con las siguientes caractersticas:  Tienen ciertas infecciones durante el embarazo, como infeccin de vejiga, infeccin de transmisin sexual o infeccin en el tero (corioamnionitis).  Tienen el cuello del tero ms corto que lo normal.  Tuvieron trabajo de parto prematuro anteriormente.  Se sometieron a una ciruga en el cuello del tero.  Son menores de 17aos o mayores de 37aos de edad.  Son afroamericanas.  Estn embarazadas de [employer] o de varios bebs (gestacin mltiple).  Consumen drogas o fuman mientras estn embarazadas.  No aumentan de peso lo suficiente durante el [employer].  Se embarazan poco despus de [employer] CULES SON LOS SNTOMAS DEL [place]? Los sntomas del trabajo de parto prematuro incluyen lo  siguiente:  Marketing executive similares a los que ocurren durante el perodo menstrual. Los calambres pueden presentarse con diarrea.  Dolor en el abdomen o en la parte inferior de la espalda.  Contracciones uterinas regulares que se pueden sentir como una presin en el abdomen.  Una sensacin de mayor presin en la pelvis.  Aumento de la secrecin de moco acuoso o sanguinolento en la vagina.  Rotura de bolsa (rotura de saco amnitico). POR QU ES IMPORTANTE RECONOCER LOS SIGNOS DEL Max? Es Glass blower/designer los signos del trabajo de parto prematuro porque los bebs que nacen de forma prematura pueden no estar completamente desarrollados. Por lo tanto, pueden correr mayor riesgo de lo siguiente:  Problemas cardacos y pulmonares a Barrister's clerk (crnicos).  Inmediatamente despus del parto, dificultades para regular los sistemas corporales, que incluyen glucemia, temperatura corporal, frecuencia cardaca y frecuencia respiratoria.  Hemorragia cerebral.  Parlisis cerebral.  Dificultades en el aprendizaje.  Muerte. Estos riesgos son [employer] para bebs que nacen antes de las 34semanas de [place]. [place]? El tratamiento depende del tiempo de su [place], su afeccin y la salud de su beb. Puede incluir lo siguiente:  Tener un punto (sutura) en el cuello del tero para evitar que este se abra demasiado pronto (cerclaje).  Tomar medicamentos, por ejemplo: ? Medicamentos hormonales. Estos se pueden administrar de forma temprana en el embarazo para ayudar a Comptroller. ? [place] contracciones. ? Medicamentos que ayudan a [employer] del beb. Estos se pueden recetar si el riesgo de parto es [place]. ? Medicamentos para evitar que el beb desarrolle parlisis cerebral. Si el  trabajo de parto de inicia antes de las 34semanas de [place], es posible que deba hospitalizarse. QU DEBO  HACER SI CREO QUE ESTOY EN TRABAJO DE PARTO PREMATURO? Si cree que est iniciando trabajo de parto prematuro, llame al mdico de inmediato. CMO PUEDO EVITAR EL TRABAJO DE PARTO PREMATURO EN FUTUROS EMBARAZOS? Para aumentar las probabilidades de tener un embarazo a trmino, Financial planner en cuenta lo siguiente:  No consuma ningn producto que contenga tabaco, lo que incluye cigarrillos, tabaco de Theatre manager y Administrator, Civil Service. Si necesita ayuda para dejar de fumar, consulte al mdico.  No consuma drogas ni medicamentos que no sean recetados Academic librarian.  Hable con el mdico antes de tomar suplementos a base de hierbas aunque los [employer].  Asegrese de llegar a un peso Office manager.  Tenga cuidado con las infecciones. Si cree que puede tener una infeccin, consulte al mdico para que la revisen.  Asegrese de informarle al mdico si ha tenido trabajo de parto prematuro antes. Esta informacin no tiene Theme park manager el consejo del mdico. Asegrese de hacerle al mdico cualquier pregunta que tenga. Document Revised: 11/10/2015 Document Reviewed: 07/27/2015 Elsevier Patient Education  2020 Elsevier Inc.        Prueba de fibronectina fetal Fetal Fibronectin Test Por qu me debo realizar esta prueba? La fibronectina fetal (FNf) es una protena que produce el cuerpo durante el [place]. La presencia de FNf en la secrecin vaginal entre la semana22 y 36de embarazo podra ser una advertencia de que el beb nacer antes de tiempo (prematuro). Los bebs prematuros, o que nacen antes de la semana37, pueden tener problemas para respirar o alimentarse. Pueden hacerle esta prueba si tiene sntomas de trabajo de [address], como por ejemplo:  Contracciones.  Mayor secrecin vaginal.  Dolor de espalda. Qu se analiza? Esta prueba controla el nivel de FNf en la secrecin vaginal. Qu tipo de [place] se toma?  Este estudio  requiere una muestra de la secrecin de la parte interna de la vagina. El mdico obtiene esta muestra usando un hisopo de algodn. Cmo debo prepararme para esta prueba?  Durante las 24 horas antes de la prueba, no tenga relaciones sexuales ni introduzca nada en la vagina. Informe al mdico acerca de lo siguiente:  Cualquier alergia que tenga.  Cualquier afeccin mdica que tenga, especialmente cualquier infeccin mictica vaginal o cualquier sntoma de infeccin por hongos, lo que incluye: ? Picazn. ? Inflamacin. ? Secrecin inusual. Cmo se informan los resultados? Los resultados se informarn como positivos o negativos para FNf. De ser positivos, los resultados pueden tambin informarse como microgramos de FNf por mililitro de secrecin vaginal (mcg/ml). Un resultado de 0,05 mcg/ml o menos se considera negativo. Puede producirse un resultado positivo falso. Este tipo de resultado es incorrecto porque indica que una enfermedad est presente cuando en realidad no lo est. Varios factores pueden ocasionar resultados positivos falsos, incluyendo el sangrado vaginal, las relaciones sexuales recientes o un examen cervical reciente. Su mdico le indicar si es Medical sales representative repetir las pruebas para [employer]. Qu significan los resultados? Un resultado negativo significa que no se detect FNf en la secrecin vaginal, lo que significa que es improbable que se produzca un parto prematuro en las prximas 2 semanas. Si todava tiene sntomas de trabajo de [address], pueden hacerle esta prueba de [employer]. Un resultado positivo significa que se detect FNf en la secrecin vaginal. Esto significa que su riesgo de trabajo de [place] prematuro es  mayor, pero no significa que usted tendr Surveyor, mining. El mdico puede hacerle otras pruebas y exmenes para supervisar de cerca su Psychiatrist. Hable con su mdico sobre lo que significan sus [place]. Preguntas para hacerle al  mdico Consulte a su mdico o pregunte en el departamento donde se realiza la prueba acerca de lo siguiente:  Cundo estarn disponibles mis resultados?  Cmo obtendr mis resultados?  Cules son mis opciones de tratamiento?  Qu otras pruebas necesito?  Cules son los prximos pasos que debo seguir? Resumen  La fibronectina fetal (FNf) es una protena que produce el cuerpo durante el [place].  La presencia de FNf en la secrecin vaginal entre la semana22 y 36de embarazo podra ser una advertencia de que el beb nacer antes de tiempo (prematuro).  Un resultado negativo significa que no se detect FNf en la secrecin vaginal, lo que significa que es improbable que se produzca un parto prematuro durante las prximas 2 semanas. Esta informacin no tiene Theme park manager el consejo del mdico. Asegrese de hacerle al mdico cualquier pregunta que tenga. Document Revised: 12/31/2016 Document Reviewed: 12/31/2016 Elsevier Patient Education  2020 [employer].

## 2020-03-04 LAB — CERVICOVAGINAL ANCILLARY ONLY
Chlamydia: NEGATIVE
Comment: NEGATIVE
Comment: NEGATIVE
Comment: NORMAL
Neisseria Gonorrhea: NEGATIVE
Trichomonas: NEGATIVE

## 2020-03-06 LAB — URINE CULTURE

## 2020-03-14 ENCOUNTER — Other Ambulatory Visit: Payer: Self-pay | Admitting: *Deleted

## 2020-03-14 ENCOUNTER — Ambulatory Visit: Payer: Self-pay | Attending: Obstetrics and Gynecology

## 2020-03-14 ENCOUNTER — Ambulatory Visit: Payer: Self-pay | Admitting: *Deleted

## 2020-03-14 ENCOUNTER — Encounter: Payer: Self-pay | Admitting: *Deleted

## 2020-03-14 ENCOUNTER — Other Ambulatory Visit: Payer: Self-pay

## 2020-03-14 DIAGNOSIS — E669 Obesity, unspecified: Secondary | ICD-10-CM

## 2020-03-14 DIAGNOSIS — O99213 Obesity complicating pregnancy, third trimester: Secondary | ICD-10-CM

## 2020-03-14 DIAGNOSIS — O09523 Supervision of elderly multigravida, third trimester: Secondary | ICD-10-CM

## 2020-03-14 DIAGNOSIS — O09213 Supervision of pregnancy with history of pre-term labor, third trimester: Secondary | ICD-10-CM

## 2020-03-14 DIAGNOSIS — O0990 Supervision of high risk pregnancy, unspecified, unspecified trimester: Secondary | ICD-10-CM | POA: Insufficient documentation

## 2020-03-14 DIAGNOSIS — O09293 Supervision of pregnancy with other poor reproductive or obstetric history, third trimester: Secondary | ICD-10-CM

## 2020-03-14 DIAGNOSIS — O24415 Gestational diabetes mellitus in pregnancy, controlled by oral hypoglycemic drugs: Secondary | ICD-10-CM

## 2020-03-14 DIAGNOSIS — Z3A29 29 weeks gestation of pregnancy: Secondary | ICD-10-CM

## 2020-03-19 NOTE — L&D Delivery Note (Addendum)
OB/GYN Faculty Practice Delivery Note  Aimee Benjamin is a 40 y.o. [id number] s/p SVD at [redacted]w[redacted]d. She was admitted for PPROM.   ROM: 0h 66m with clear fluid (PPROM at 0830 on 2/13, but was likely forebag) GBS Status: Negative Maximum Maternal Temperature: 98.3  Labor Progress: Presented for PPROM at 0830 an 2/13/222.  Contraction pattern spaced after epidural and was started on pitocin.  Developed pre-eclampsia (by BP and UP:C) without severe features.  Had some recurrent variable decelerations when complete, so AROM was performed (PPROM was likely forebag).    Delivery Date/Time: 05/01/2020 at 2314 Delivery: Called to room and patient was complete and pushing. Head delivered LOA. No nuchal cord present. Shoulder and body delivered in usual fashion. Infant with spontaneous cry, placed on mother's abdomen, dried and stimulated. Cord clamped x 2 after 1-minute delay, and cut by FOB under my direct supervision. Cord blood drawn. Placenta delivered spontaneously with gentle cord traction. Fundus firm with massage and Pitocin. Labia, perineum, vagina, and cervix were inspected, and no lacerations were noted.  Given grand-multiparity, tranexamic acid 1000 mg IV was given shortly after delivery.  Approximately 15 minutes after delivery, she began passing some quarter sized blood clots.  Fundus again noted to be firm.  Manual fundal sweep was performed by Dr. Barb Merino and several quarter sized clots were removed from the lower uterine segment.  Cytotec 1000 mg was placed rectally.    Placenta: Intact, 3 vessel cord Complications: None Lacerations: None QBL: 274 cc Analgesia: Epidural  Infant: Viable female  APGARs 8 & 9  weight pending   EMILY M GREEN, MD 05/01/2020, 11:31 PM  Given continued bleeding secondary to atony, foley catheter was replaced. Methergine, hemabate and a second dose of TXA were also given. Additional uterine sweep was performed; several pieces of chorion were produced. Good  improvement prior to transfer to postpartum unit.  I was gloved and present for delivery of infant and placenta as noted above.  Sheila Oats, MD OB Fellow, Faculty Practice 05/02/2020 10:14 AM

## 2020-03-23 ENCOUNTER — Other Ambulatory Visit: Payer: Self-pay

## 2020-03-23 ENCOUNTER — Ambulatory Visit (INDEPENDENT_AMBULATORY_CARE_PROVIDER_SITE_OTHER): Payer: Self-pay | Admitting: Obstetrics & Gynecology

## 2020-03-23 ENCOUNTER — Encounter: Payer: Self-pay | Admitting: Obstetrics & Gynecology

## 2020-03-23 VITALS — BP 124/78 | HR 99 | Wt 167.1 lb

## 2020-03-23 DIAGNOSIS — O09899 Supervision of other high risk pregnancies, unspecified trimester: Secondary | ICD-10-CM

## 2020-03-23 DIAGNOSIS — O24415 Gestational diabetes mellitus in pregnancy, controlled by oral hypoglycemic drugs: Secondary | ICD-10-CM

## 2020-03-23 DIAGNOSIS — O0990 Supervision of high risk pregnancy, unspecified, unspecified trimester: Secondary | ICD-10-CM

## 2020-03-23 NOTE — Patient Instructions (Signed)
Tercer trimestre de embarazo Third Trimester of Pregnancy  El tercer trimestre comprende desde la semana28 hasta la semana40 (desde el mes7 hasta el mes9). En este trimestre, el beb en gestacin (feto) crece muy rpidamente. Hacia el final del noveno mes, el beb en gestacin mide alrededor de 20pulgadas (45cm) de largo. Pesa entre 6y 10libras (2,70y 4,50kg). Siga estas indicaciones en su casa: Medicamentos  Tome los medicamentos de venta libre y los recetados solamente como se lo haya indicado el mdico. Algunos medicamentos son seguros para tomar durante el embarazo y otros no lo son.  Tome vitaminas prenatales que contengan por lo menos 600microgramos (?g) de cido flico.  Si tiene dificultad para mover el intestino (estreimiento), tome un medicamento para ablandar las heces (laxante) si su mdico se lo autoriza. Comida y bebida   Ingiera alimentos saludables de manera regular.  No coma carne cruda ni quesos sin cocinar.  Si obtiene poca cantidad de calcio de los alimentos que ingiere, consulte a su mdico sobre la posibilidad de tomar un suplemento diario de calcio.  La ingesta diaria de cuatro o cinco comidas pequeas en lugar de tres comidas abundantes.  Evite el consumo de alimentos ricos en grasas y azcares, como los alimentos fritos y los dulces.  Para evitar el estreimiento: ? Consuma alimentos ricos en fibra, como frutas y verduras frescas, cereales integrales y frijoles. ? Beba suficiente lquido para mantener el pis (orina) claro o de color amarillo plido. Actividad  Haga ejercicios solamente como se lo haya indicado el mdico. Interrumpa la actividad fsica si comienza a tener calambres.  No levante objetos pesados, use zapatos de tacones bajos y sintese derecha.  No haga ejercicio si hace demasiado calor, hay demasiada humedad o se encuentra en un lugar de mucha altura (altitud alta).  Puede continuar teniendo relaciones sexuales, a menos que el  mdico le indique lo contrario. Alivio del dolor y del malestar  Use un sostn que le brinde buen soporte si sus mamas estn sensibles.  Haga pausas frecuentes y descanse con las piernas levantadas si tiene calambres en las piernas o dolor en la zona lumbar.  Dese baos de asiento con agua tibia para aliviar el dolor o las molestias causadas por las hemorroides. Use una crema para las hemorroides si el mdico la autoriza.  Si desarrolla venas hinchadas y abultadas (vrices) en las piernas: ? Use medias de compresin o medias de descanso como se lo haya indicado el mdico. ? Levante (eleve) los pies durante 15minutos, 3 o 4veces por da. ? Limite el consumo de sal en sus alimentos. Seguridad  Colquese el cinturn de seguridad cuando conduzca.  Haga una lista de los nmeros de telfono de emergencia, que incluya los nmeros de telfono de familiares, amigos, el hospital, as como los departamentos de polica y bomberos. Preparacin para la llegada del beb Para prepararse para la llegada de su beb:  Tome clases prenatales.  Practique ir manejando al hospital.  Visite el hospital y recorra el rea de maternidad.  Hable en su trabajo acerca de tomar licencia cuando llegue el beb.  Prepare el bolso que llevar al hospital.  Prepare la habitacin del beb.  Concurra a los controles mdicos.  Compre un asiento de seguridad orientado hacia atrs para llevar al beb en el automvil. Aprenda cmo instalarlo en el auto. Instrucciones generales  No se d baos de inmersin en agua caliente, baos turcos ni saunas.  No consuma ningn producto que contenga nicotina o tabaco, como cigarrillos y cigarrillos   electrnicos. Si necesita ayuda para dejar de fumar, consulte al mdico.  No beba alcohol.  No se haga duchas vaginales ni use tampones o toallas higinicas perfumadas.  No mantenga las piernas cruzadas durante mucho tiempo.  No haga viajes de larga distancia, excepto si es  obligatorio. Hgalos solamente si su mdico la autoriza.  Visite a su dentista si no lo ha hecho durante el embarazo. Use un cepillo de cerdas suaves para cepillarse los dientes. Psese el hilo dental con suavidad.  Evite el contacto con las bandejas sanitarias de los gatos y la tierra que estos animales usan. Estos elementos contienen bacterias que pueden causar defectos congnitos al beb y la posible prdida del beb (aborto espontneo) o la muerte fetal.  Concurra a todas las visitas prenatales como se lo haya indicado el mdico. Esto es importante. Comunquese con un mdico si:  No est segura de si est en trabajo de parto o si ha roto la bolsa de las aguas.  Tiene mareos.  Tiene clicos leves o siente presin en la parte baja del vientre.  Sufre un dolor persistente en el abdomen.  Sigue teniendo malestar estomacal, vomita o tiene heces lquidas.  Advierte un lquido con olor ftido que proviene de la vagina.  Siente dolor al orinar. Solicite ayuda de inmediato si:  Tiene fiebre.  Tiene una prdida de lquido por la vagina.  Tiene sangrado o pequeas prdidas vaginales.  Siente dolor intenso o clicos en el abdomen.  Aumenta o baja de peso rpidamente.  Tiene dificultades para recuperar el aliento y siente dolor en el pecho.  Sbitamente se le hinchan mucho el rostro, las manos, los tobillos, los pies o las piernas.  No ha sentido los movimientos del beb durante una hora.  Siente un dolor de cabeza intenso que no se alivia con medicamentos.  Tiene dificultad para ver.  Tiene prdida de lquido o le sale un chorro de lquido de la vagina antes de estar en la semana 37.  Tiene espasmos abdominales (contracciones) regulares antes de estar en la semana 37. Resumen  El tercer trimestre comprende desde la semana28 hasta la semana40 (desde el mes7 hasta el mes9). Esta es la poca en que el beb en gestacin crece muy rpidamente.  Siga los consejos del mdico  con respecto a los medicamentos, la alimentacin y la actividad.  Preprese para la llegada del beb tomando las clases prenatales, preparando todo lo que necesitar el beb, arreglando la habitacin del beb y concurriendo a los controles mdicos.  Solicite ayuda de inmediato si tiene sangrado por la vagina, siente dolor en el pecho o tiene dificultad para respirar, o si no ha sentido que su beb se mueve en el transcurso de ms de una hora. Esta informacin no tiene como fin reemplazar el consejo del mdico. Asegrese de hacerle al mdico cualquier pregunta que tenga. Document Revised: 10/08/2016 Document Reviewed: 10/08/2016 Elsevier Patient Education  2020 Elsevier Inc.  

## 2020-03-23 NOTE — Progress Notes (Signed)
   PRENATAL VISIT NOTE  Subjective:  Aimee Benjamin is a 40 y.o. [phone] at [redacted]w[redacted]d being seen today for ongoing prenatal care.  She is currently monitored for the following issues for this high-risk pregnancy and has Multigravida of advanced maternal age in third trimester; History of cervical incompetence in pregnancy, currently pregnant; Gestational diabetes mellitus (GDM), antepartum; BMI 32.0-32.9,adult; Language barrier; Supervision of high risk pregnancy, antepartum; Umbilical hernia without obstruction and without gangrene; History of preterm delivery, currently pregnant; and Excessive fetal growth affecting management of mother in second trimester, antepartum on their problem list.  Patient reports no complaints.  Contractions: Not present. Vag. Bleeding: None.  Movement: Present. Denies leaking of fluid.   The following portions of the patient's history were reviewed and updated as appropriate: allergies, current medications, past family history, past medical history, past social history, past surgical history and problem list.   Objective:   Vitals:   03/23/20 0828  BP: 124/78  Pulse: 99  Weight: 167 lb 1.6 oz (75.8 kg)    Fetal Status: Fetal Heart Rate (bpm): 140   Movement: Present     General:  Alert, oriented and cooperative. Patient is in no acute distress.  Skin: Skin is warm and dry. No rash noted.   Cardiovascular: Normal heart rate noted  Respiratory: Normal respiratory effort, no problems with respiration noted  Abdomen: Soft, gravid, appropriate for gestational age.  Pain/Pressure: Present     Pelvic: Cervical exam deferred        Extremities: Normal range of motion.  Edema: Trace  Mental Status: Normal mood and affect. Normal behavior. Normal judgment and thought content.   Assessment and Plan:  Pregnancy: [id number] at [redacted]w[redacted]d 1. Supervision of high risk pregnancy, antepartum GDM, [place] reviewed  2. History of preterm delivery, currently pregnant [redacted] weeks with  subsequent term  3. Gestational diabetes mellitus (GDM) controlled on oral hypoglycemic drug, antepartum All normal BG  Preterm labor symptoms and general obstetric precautions including but not limited to vaginal bleeding, contractions, leaking of fluid and fetal movement were reviewed in detail with the patient. Please refer to After Visit Summary for other counseling recommendations.   Return in about 2 weeks (around 04/06/2020).  Future Appointments  Date Time Provider Department Center  04/04/2020  1:00 PM [facility] NURSE [facility]  04/04/2020  1:15 PM [facility] US2 [facility]  04/11/2020  9:00 AM [facility] NURSE [facility]  04/11/2020  9:15 AM [facility] US2 [facility]  04/18/2020 10:30 AM [facility] NURSE [facility]  04/18/2020 10:45 AM [facility] US4 [facility]    Scheryl Darter, MD

## 2020-03-26 ENCOUNTER — Inpatient Hospital Stay (HOSPITAL_COMMUNITY)
Admission: EM | Admit: 2020-03-26 | Discharge: 2020-03-31 | DRG: 831 | Disposition: A | Discharge status: Home / Self Care | Payer: HRSA Program | Attending: Obstetrics and Gynecology | Admitting: Obstetrics and Gynecology

## 2020-03-26 ENCOUNTER — Other Ambulatory Visit: Payer: Self-pay

## 2020-03-26 DIAGNOSIS — D696 Thrombocytopenia, unspecified: Secondary | ICD-10-CM | POA: Diagnosis present

## 2020-03-26 DIAGNOSIS — O09523 Supervision of elderly multigravida, third trimester: Secondary | ICD-10-CM | POA: Diagnosis present

## 2020-03-26 DIAGNOSIS — O98513 Other viral diseases complicating pregnancy, third trimester: Principal | ICD-10-CM | POA: Diagnosis present

## 2020-03-26 DIAGNOSIS — U071 COVID-19: Secondary | ICD-10-CM | POA: Diagnosis present

## 2020-03-26 DIAGNOSIS — O99513 Diseases of the respiratory system complicating pregnancy, third trimester: Secondary | ICD-10-CM | POA: Diagnosis present

## 2020-03-26 DIAGNOSIS — J1282 Pneumonia due to coronavirus disease 2019: Secondary | ICD-10-CM | POA: Diagnosis present

## 2020-03-26 DIAGNOSIS — Z6832 Body mass index (BMI) 32.0-32.9, adult: Secondary | ICD-10-CM

## 2020-03-26 DIAGNOSIS — O99119 Other diseases of the blood and blood-forming organs and certain disorders involving the immune mechanism complicating pregnancy, unspecified trimester: Secondary | ICD-10-CM | POA: Diagnosis present

## 2020-03-26 DIAGNOSIS — Z789 Other specified health status: Secondary | ICD-10-CM | POA: Diagnosis present

## 2020-03-26 DIAGNOSIS — Z8616 Personal history of COVID-19: Secondary | ICD-10-CM | POA: Diagnosis present

## 2020-03-26 DIAGNOSIS — Z603 Acculturation difficulty: Secondary | ICD-10-CM | POA: Diagnosis present

## 2020-03-26 DIAGNOSIS — O99113 Other diseases of the blood and blood-forming organs and certain disorders involving the immune mechanism complicating pregnancy, third trimester: Secondary | ICD-10-CM | POA: Diagnosis present

## 2020-03-26 DIAGNOSIS — Z880 Allergy status to penicillin: Secondary | ICD-10-CM

## 2020-03-26 DIAGNOSIS — D6959 Other secondary thrombocytopenia: Secondary | ICD-10-CM | POA: Diagnosis present

## 2020-03-26 DIAGNOSIS — O24419 Gestational diabetes mellitus in pregnancy, unspecified control: Secondary | ICD-10-CM | POA: Diagnosis present

## 2020-03-26 DIAGNOSIS — O24410 Gestational diabetes mellitus in pregnancy, diet controlled: Secondary | ICD-10-CM | POA: Diagnosis present

## 2020-03-26 DIAGNOSIS — Z3A31 31 weeks gestation of pregnancy: Secondary | ICD-10-CM

## 2020-03-27 ENCOUNTER — Emergency Department (HOSPITAL_COMMUNITY): Payer: HRSA Program

## 2020-03-27 ENCOUNTER — Other Ambulatory Visit: Payer: Self-pay

## 2020-03-27 ENCOUNTER — Encounter (HOSPITAL_COMMUNITY): Payer: Self-pay | Admitting: Obstetrics and Gynecology

## 2020-03-27 DIAGNOSIS — O98513 Other viral diseases complicating pregnancy, third trimester: Principal | ICD-10-CM

## 2020-03-27 DIAGNOSIS — Z880 Allergy status to penicillin: Secondary | ICD-10-CM | POA: Diagnosis not present

## 2020-03-27 DIAGNOSIS — O99513 Diseases of the respiratory system complicating pregnancy, third trimester: Secondary | ICD-10-CM | POA: Diagnosis present

## 2020-03-27 DIAGNOSIS — U071 COVID-19: Secondary | ICD-10-CM | POA: Diagnosis present

## 2020-03-27 DIAGNOSIS — O99119 Other diseases of the blood and blood-forming organs and certain disorders involving the immune mechanism complicating pregnancy, unspecified trimester: Secondary | ICD-10-CM | POA: Diagnosis present

## 2020-03-27 DIAGNOSIS — D6959 Other secondary thrombocytopenia: Secondary | ICD-10-CM | POA: Diagnosis present

## 2020-03-27 DIAGNOSIS — Z8616 Personal history of COVID-19: Secondary | ICD-10-CM | POA: Diagnosis present

## 2020-03-27 DIAGNOSIS — O99113 Other diseases of the blood and blood-forming organs and certain disorders involving the immune mechanism complicating pregnancy, third trimester: Secondary | ICD-10-CM | POA: Diagnosis present

## 2020-03-27 DIAGNOSIS — Z3A31 31 weeks gestation of pregnancy: Secondary | ICD-10-CM | POA: Diagnosis not present

## 2020-03-27 DIAGNOSIS — D696 Thrombocytopenia, unspecified: Secondary | ICD-10-CM | POA: Diagnosis present

## 2020-03-27 DIAGNOSIS — O24410 Gestational diabetes mellitus in pregnancy, diet controlled: Secondary | ICD-10-CM | POA: Diagnosis present

## 2020-03-27 DIAGNOSIS — J1282 Pneumonia due to coronavirus disease 2019: Secondary | ICD-10-CM | POA: Diagnosis present

## 2020-03-27 DIAGNOSIS — O09523 Supervision of elderly multigravida, third trimester: Secondary | ICD-10-CM | POA: Diagnosis not present

## 2020-03-27 LAB — BASIC METABOLIC PANEL
Anion gap: 11 (ref 5–15)
BUN: 9 mg/dL (ref 6–20)
CO2: 16 mmol/L — ABNORMAL LOW (ref 22–32)
Calcium: 8.2 mg/dL — ABNORMAL LOW (ref 8.9–10.3)
Chloride: 107 mmol/L (ref 98–111)
Creatinine, Ser: 0.61 mg/dL (ref 0.44–1.00)
GFR, Estimated: >60 mL/min (ref >60)
Glucose, Bld: 86 mg/dL (ref 70–99)
Potassium: 4.2 mmol/L (ref 3.5–5.1)
Sodium: 134 mmol/L — ABNORMAL LOW (ref 135–145)

## 2020-03-27 LAB — PROCALCITONIN: Procalcitonin: 0.45 ng/mL

## 2020-03-27 LAB — CBC
HCT: 39.9 % (ref 36.0–46.0)
Hemoglobin: 13.6 g/dL (ref 12.0–15.0)
MCH: 30.2 pg (ref 26.0–34.0)
MCHC: 34.1 g/dL (ref 30.0–36.0)
MCV: 88.7 fL (ref 80.0–100.0)
Platelets: 112 10*3/uL — ABNORMAL LOW (ref 150–400)
RBC: 4.5 MIL/uL (ref 3.87–5.11)
RDW: 12.9 % (ref 11.5–15.5)
WBC: 5.2 10*3/uL (ref 4.0–10.5)
nRBC: 0 % (ref 0.0–0.2)

## 2020-03-27 LAB — GLUCOSE, CAPILLARY
Glucose-Capillary: 77 mg/dL (ref 70–99)
Glucose-Capillary: 149 mg/dL — ABNORMAL HIGH (ref 70–99)

## 2020-03-27 LAB — LACTATE DEHYDROGENASE: LDH: 188 U/L (ref 98–192)

## 2020-03-27 LAB — HEPATIC FUNCTION PANEL
ALT: 40 U/L (ref 0–44)
AST: 49 U/L — ABNORMAL HIGH (ref 15–41)
Albumin: 2.6 g/dL — ABNORMAL LOW (ref 3.5–5.0)
Alkaline Phosphatase: 96 U/L (ref 38–126)
Bilirubin, Direct: 0.2 mg/dL (ref 0.0–0.2)
Indirect Bilirubin: 0.4 mg/dL (ref 0.3–0.9)
Total Bilirubin: 0.6 mg/dL (ref 0.3–1.2)
Total Protein: 6.2 g/dL — ABNORMAL LOW (ref 6.5–8.1)

## 2020-03-27 LAB — RESP PANEL BY RT-PCR (FLU A&B, COVID) ARPGX2
Influenza A by PCR: NEGATIVE
Influenza B by PCR: NEGATIVE
SARS Coronavirus 2 by RT PCR: POSITIVE — AB

## 2020-03-27 LAB — TROPONIN I (HIGH SENSITIVITY)
Troponin I (High Sensitivity): 3 ng/L (ref <18)
Troponin I (High Sensitivity): 3 ng/L (ref <18)

## 2020-03-27 LAB — TYPE AND SCREEN
ABO/RH(D): O POS
Antibody Screen: NEGATIVE

## 2020-03-27 LAB — FIBRINOGEN: Fibrinogen: 469 mg/dL (ref 210–475)

## 2020-03-27 LAB — C-REACTIVE PROTEIN: CRP: 5 mg/dL — ABNORMAL HIGH (ref <1.0)

## 2020-03-27 LAB — BRAIN NATRIURETIC PEPTIDE: B Natriuretic Peptide: 20.3 pg/mL (ref 0.0–100.0)

## 2020-03-27 LAB — I-STAT BETA HCG BLOOD, ED (MC, WL, AP ONLY): I-stat hCG, quantitative: >2000 m[IU]/mL — ABNORMAL HIGH (ref <5)

## 2020-03-27 LAB — FERRITIN: Ferritin: 87 ng/mL (ref 11–307)

## 2020-03-27 LAB — D-DIMER, QUANTITATIVE: D-Dimer, Quant: 0.92 ug/mL-FEU — ABNORMAL HIGH (ref 0.00–0.50)

## 2020-03-27 MED ORDER — ACETAMINOPHEN 325 MG PO TABS
650.0000 mg | ORAL_TABLET | Freq: Four times a day (QID) | ORAL | Status: DC | PRN
  Administered 2020-03-28: 650 mg via ORAL
  Filled 2020-03-27: qty 2

## 2020-03-27 MED ORDER — ONDANSETRON HCL 4 MG PO TABS
4.0000 mg | ORAL_TABLET | Freq: Four times a day (QID) | ORAL | Status: DC | PRN
Start: 2020-03-27 — End: 2020-03-28

## 2020-03-27 MED ORDER — ACETAMINOPHEN 325 MG PO TABS
650.0000 mg | ORAL_TABLET | Freq: Once | ORAL | Status: AC
  Administered 2020-03-27: 650 mg via ORAL
  Filled 2020-03-27: qty 2

## 2020-03-27 MED ORDER — ENOXAPARIN SODIUM 40 MG/0.4ML ~~LOC~~ SOLN
40.0000 mg | SUBCUTANEOUS | Status: DC
  Administered 2020-03-27 – 2020-03-30 (×4): 40 mg via SUBCUTANEOUS
  Filled 2020-03-27 (×4): qty 0.4

## 2020-03-27 MED ORDER — SALINE SPRAY 0.65 % NA SOLN
1.0000 | Freq: Three times a day (TID) | NASAL | Status: DC
  Administered 2020-03-27 – 2020-03-31 (×11): 1 via NASAL
  Filled 2020-03-27: qty 44

## 2020-03-27 MED ORDER — CALCIUM CARBONATE ANTACID 500 MG PO CHEW: 2.0000 | CHEWABLE_TABLET | ORAL | Status: DC | PRN

## 2020-03-27 MED ORDER — SODIUM CHLORIDE 0.9% FLUSH
3.0000 mL | Freq: Two times a day (BID) | INTRAVENOUS | Status: DC
  Administered 2020-03-27 – 2020-03-31 (×8): 3 mL via INTRAVENOUS

## 2020-03-27 MED ORDER — DEXAMETHASONE SODIUM PHOSPHATE 10 MG/ML IJ SOLN
6.0000 mg | INTRAMUSCULAR | Status: DC
  Administered 2020-03-27 – 2020-03-30 (×4): 6 mg via INTRAVENOUS
  Filled 2020-03-27 (×4): qty 1

## 2020-03-27 MED ORDER — ONDANSETRON HCL 4 MG/2ML IJ SOLN: 4.0000 mg | Freq: Four times a day (QID) | INTRAMUSCULAR | Status: DC | PRN

## 2020-03-27 MED ORDER — FLUTICASONE PROPIONATE 50 MCG/ACT NA SUSP
1.0000 | Freq: Two times a day (BID) | NASAL | Status: DC
  Administered 2020-03-27 – 2020-03-31 (×8): 1 via NASAL
  Filled 2020-03-27: qty 16

## 2020-03-27 MED ORDER — SODIUM CHLORIDE 0.9% FLUSH: 3.0000 mL | INTRAVENOUS | Status: DC | PRN

## 2020-03-27 MED ORDER — ZINC SULFATE 220 (50 ZN) MG PO CAPS
220.0000 mg | ORAL_CAPSULE | Freq: Every day | ORAL | Status: DC
  Administered 2020-03-27 – 2020-03-31 (×5): 220 mg via ORAL
  Filled 2020-03-27 (×5): qty 1

## 2020-03-27 MED ORDER — ASCORBIC ACID 500 MG PO TABS
500.0000 mg | ORAL_TABLET | Freq: Every day | ORAL | Status: DC
  Administered 2020-03-28 – 2020-03-31 (×4): 500 mg via ORAL
  Filled 2020-03-27 (×5): qty 1

## 2020-03-27 MED ORDER — PRENATAL MULTIVITAMIN CH
1.0000 | ORAL_TABLET | Freq: Every day | ORAL | Status: DC
  Administered 2020-03-27 – 2020-03-31 (×5): 1 via ORAL
  Filled 2020-03-27 (×5): qty 1

## 2020-03-27 MED ORDER — INSULIN ASPART 100 UNIT/ML ~~LOC~~ SOLN
0.0000 [IU] | Freq: Three times a day (TID) | SUBCUTANEOUS | Status: DC
  Administered 2020-03-27: 2 [IU] via SUBCUTANEOUS
  Administered 2020-03-28: 4 [IU] via SUBCUTANEOUS
  Administered 2020-03-29: 8 [IU] via SUBCUTANEOUS
  Administered 2020-03-30: 2 [IU] via SUBCUTANEOUS

## 2020-03-27 MED ORDER — ALBUTEROL SULFATE HFA 108 (90 BASE) MCG/ACT IN AERS
2.0000 | INHALATION_SPRAY | Freq: Four times a day (QID) | RESPIRATORY_TRACT | Status: DC
  Administered 2020-03-27 – 2020-03-31 (×15): 2 via RESPIRATORY_TRACT
  Filled 2020-03-27: qty 6.7

## 2020-03-27 MED ORDER — SODIUM CHLORIDE 0.9 % IV SOLN
200.0000 mg | Freq: Once | INTRAVENOUS | Status: AC
  Administered 2020-03-27: 200 mg via INTRAVENOUS
  Filled 2020-03-27 (×2): qty 40

## 2020-03-27 MED ORDER — SODIUM CHLORIDE 0.9 % IV SOLN
100.0000 mg | Freq: Every day | INTRAVENOUS | Status: AC
  Administered 2020-03-28 – 2020-03-31 (×4): 100 mg via INTRAVENOUS
  Filled 2020-03-27: qty 2.5
  Filled 2020-03-27: qty 20
  Filled 2020-03-27 (×2): qty 100

## 2020-03-27 MED ORDER — DOCUSATE SODIUM 100 MG PO CAPS: 100.0000 mg | ORAL_CAPSULE | Freq: Two times a day (BID) | ORAL | Status: DC | PRN

## 2020-03-27 MED ORDER — METFORMIN HCL 500 MG PO TABS: 500.0000 mg | ORAL_TABLET | Freq: Every day | ORAL | Status: DC

## 2020-03-27 MED ORDER — VITAMIN D 25 MCG (1000 UNIT) PO TABS
1000.0000 [IU] | ORAL_TABLET | Freq: Every day | ORAL | Status: DC
  Administered 2020-03-27 – 2020-03-31 (×5): 1000 [IU] via ORAL
  Filled 2020-03-27 (×5): qty 1

## 2020-03-27 MED ORDER — SODIUM CHLORIDE 0.9 % IV SOLN
250.0000 mL | INTRAVENOUS | Status: DC | PRN
  Administered 2020-03-27: 250 mL via INTRAVENOUS

## 2020-03-27 NOTE — ED Provider Notes (Signed)
[facility] EMERGENCY DEPARTMENT Provider Note   CSN: [id number] Arrival date & time: 03/26/20  2225     History Chief Complaint  Patient presents with  . Chest Pain  . Covid Positive    Aimee Benjamin is a 40 y.o. female.  HPI      Aimee Benjamin is a 40 y.o. female, with a history of gestational diabetes, high risk pregnancy, presenting to the ED with cough, fever, sore throat, and intermittent shortness of breath beginning December 31 (day 10 of symptoms). Chest discomfort only when coughing.  Patient is currently pregnant with estimated due date March 9.  [redacted] weeks gestation, however, estimated by ultrasound and infant size on December 27 with updated estimated due date of February 28.  Patient followed by faculty practice. Positive fetal movement. No Covid vaccination.  Denies abdominal pain, vomiting, diarrhea, vaginal discharge, vaginal bleeding, other chest discomfort, persistent shortness of breath, or any other complaints.  Past Medical History:  Diagnosis Date  . Diabetes mellitus without complication (HCC)   . GDM, class A2 11/28/2015   PP GTT Nml  . Incompetence of cervix   . Preterm labor     Patient Active Problem List   Diagnosis Date Noted  . Excessive fetal growth affecting management of mother in second trimester, antepartum 02/22/2020  . Supervision of high risk pregnancy, antepartum 12/03/2019  . Umbilical hernia without obstruction and without gangrene 12/03/2019  . History of preterm delivery, currently pregnant 12/03/2019  . Language barrier 03/08/2016  . Multigravida of advanced maternal age in third trimester 11/28/2015  . History of cervical incompetence in pregnancy, currently pregnant 11/28/2015  . Gestational diabetes mellitus (GDM), antepartum 11/28/2015  . BMI 32.0-32.9,adult 11/28/2015    Past Surgical History:  Procedure Laterality Date  . CERVICAL CERCLAGE N/A 08/27/2012   Procedure: CERCLAGE CERVICAL;   Surgeon: [name], MD;  Location: [facility];  Service: Gynecology;  Laterality: N/A;  . CERVICAL CERCLAGE N/A 02/09/2014   Procedure: CERCLAGE CERVICAL;  Surgeon: [name], MD;  Location: [facility];  Service: Gynecology;  Laterality: N/A;  . CHOLECYSTECTOMY       OB History    Gravida  9   Para  4   Term  3   Preterm  1   AB  4   Living  3     SAB  4   IAB      Ectopic      Multiple  0   Live Births  4           Family History  Problem Relation Age of Onset  . Diabetes Mother     Social History   Tobacco Use  . Smoking status: Never Smoker  . Smokeless tobacco: Never Used  Vaping Use  . Vaping Use: Never used  Substance Use Topics  . Alcohol use: No  . Drug use: Not Currently    Home Medications Prior to Admission medications   Medication Sig Start Date End Date Taking? Authorizing Provider  aspirin EC 81 MG tablet Take 1 tablet (81 mg total) by mouth daily. Take after 12 weeks for prevention of preeclampsia later in pregnancy 01/01/20   Levie Heritage, DO  metFORMIN (GLUCOPHAGE) 500 MG tablet Take 1 tablet (500 mg total) by mouth daily with supper. 01/01/20   Levie Heritage, DO  Prenatal Vit-Fe Fumarate-FA (PRENATAL VITAMINS) 28-0.8 MG TABS Take 1 tablet by mouth daily.    [provider]  Allergies    Aspirin and Penicillins  Review of Systems   Review of Systems  Constitutional: Positive for fatigue and fever.  Respiratory: Positive for cough and shortness of breath.   Cardiovascular: Negative for leg swelling.  Gastrointestinal: Negative for abdominal pain, diarrhea, nausea and vomiting.  Genitourinary: Negative for difficulty urinating, vaginal bleeding and vaginal discharge.  Musculoskeletal: Negative for back pain.  Neurological: Negative for dizziness, syncope and weakness.  All other systems reviewed and are negative.   Physical Exam Updated Vital Signs BP 117/73   Pulse (!) 101   Temp 99 F (37.2 C)  (Oral)   Resp 20   LMP 08/01/2019   SpO2 100%   Physical Exam Vitals and nursing note reviewed.  Constitutional:      General: She is not in acute distress.    Appearance: She is well-developed. She is not diaphoretic.  HENT:     Head: Normocephalic and atraumatic.     Mouth/Throat:     Mouth: Mucous membranes are moist.     Pharynx: Oropharynx is clear.  Eyes:     Conjunctiva/sclera: Conjunctivae normal.  Cardiovascular:     Rate and Rhythm: Normal rate and regular rhythm.     Pulses: Normal pulses.          Radial pulses are 2+ on the right side and 2+ on the left side.       Posterior tibial pulses are 2+ on the right side and 2+ on the left side.     Heart sounds: Normal heart sounds.     Comments: Tactile temperature in the extremities appropriate and equal bilaterally. Not tachycardic on my exam. Pulmonary:     Effort: Pulmonary effort is normal. No respiratory distress.     Breath sounds: Normal breath sounds.  Abdominal:     Palpations: Abdomen is soft.     Tenderness: There is no abdominal tenderness. There is no guarding.  Musculoskeletal:     Cervical back: Neck supple.     Right lower leg: No edema.     Left lower leg: No edema.  Lymphadenopathy:     Cervical: No cervical adenopathy.  Skin:    General: Skin is warm and dry.  Neurological:     Mental Status: She is alert.  Psychiatric:        Mood and Affect: Mood and affect normal.        Speech: Speech normal.        Behavior: Behavior normal.     ED Results / Procedures / Treatments   Labs (all labs ordered are listed, but only abnormal results are displayed) Labs Reviewed  RESP PANEL BY RT-PCR (FLU A&B, COVID) ARPGX2 - Abnormal; Notable for the following components:      Result Value   SARS Coronavirus 2 by RT PCR POSITIVE (*)    All other components within normal limits  BASIC METABOLIC PANEL - Abnormal; Notable for the following components:   Sodium 134 (*)    CO2 16 (*)    Calcium 8.2 (*)     All other components within normal limits  CBC - Abnormal; Notable for the following components:   Platelets 112 (*)    All other components within normal limits  I-STAT BETA HCG BLOOD, ED ([facility], [facility], [facility] ONLY) - Abnormal; Notable for the following components:   I-stat hCG, quantitative >2,000.0 (*)    All other components within normal limits  TROPONIN I (HIGH SENSITIVITY)  TROPONIN I (HIGH SENSITIVITY)  EKG EKG Interpretation  Date/Time:  Sunday March 27 2020 01:00:32 EST Ventricular Rate:  103 PR Interval:  144 QRS Duration: 70 QT Interval:  330 QTC Calculation: 432 R Axis:   51 Text Interpretation: Sinus tachycardia Otherwise normal ECG No old tracing to compare Confirmed by Dione BoozeGlick, David ([id number]) on 03/27/2020 1:14:33 AM   Radiology DG Chest Portable 1 View  Result Date: 03/27/2020 CLINICAL DATA:  Shortness of breath. EXAM: PORTABLE CHEST 1 VIEW COMPARISON:  None. FINDINGS: Lung volumes are low. Bronchovascular crowding related to low lung volumes. There may be underlying peribronchial thickening. Upper normal heart size likely accentuated by AP technique. Normal mediastinal contours. No confluent consolidation. No pleural fluid. No pneumothorax. No acute osseous abnormalities are seen. IMPRESSION: 1. Low lung volumes with bronchovascular crowding. Possible underlying peribronchial thickening. 2. Upper normal heart size likely accentuated by AP technique. Electronically Signed   By: Narda RutherfordMelanie  Sanford M.D.   On: 03/27/2020 01:40    Procedures Procedures (including critical care time)  Medications Ordered in ED Medications  acetaminophen (TYLENOL) tablet 650 mg (650 mg Oral Given by Other 03/27/20 1147)    ED Course  I have reviewed the triage vital signs and the nursing notes.  Pertinent labs & imaging results that were available during my care of the patient were reviewed by me and considered in my medical decision making (see chart for details).  Clinical Course as of  03/27/20 1411  Wynelle LinkSun Mar 27, 2020  1150 Spanish interpreter, Nolon RodJuan Carlos via phone. [SJ]  [phone] Spoke with Dr. Vergie LivingPickens, OBGYN.  States he will comes assess the patient.  [SJ]  1311 Spoke with Corrie DandyMary, [facility] RN.  States patient is cleared from an OB point of view, however, she reaffirms Dr. Vergie LivingPickens will be coming to see the patient. [SJ]  1317 Dr. Vergie LivingPickens evaluated the patient and has decided to admit her for observation. [SJ]    Clinical Course User Index [SJ] Chia Rock C, PA-C   MDM Rules/Calculators/A&P                          Patient presents with cough, sore throat, fever, intermittent shortness of breath. Patient is nontoxic appearing, afebrile, not tachycardic, not hypotensive, maintains excellent SPO2 on room air, and is in no apparent distress.  Intermittently with mild tachypnea.  I have reviewed the patient's chart to obtain more information.   I reviewed and interpreted the patient's labs and radiological studies.  [facility] called by secretary to the patient's bedside for monitoring. Covid positive.  Patient admitted by OB/GYN for observation.  Findings and plan of care discussed with attending physician, Marguarite ArbourMatt Trifan, MD.    Aimee Benjamin was evaluated in Emergency Department on 03/27/2020 for the symptoms described in the history of present illness. She was evaluated in the context of the global COVID-19 pandemic, which necessitated consideration that the patient might be at risk for infection with the SARS-CoV-2 virus that causes COVID-19. Institutional protocols and algorithms that pertain to the evaluation of patients at risk for COVID-19 are in a state of rapid change based on information released by regulatory bodies including the CDC and federal and state organizations. These policies and algorithms were followed during the patient's care in the ED.  Final Clinical Impression(s) / ED Diagnoses Final diagnoses:  COVID-19    Rx / DC Orders ED Discharge Orders     None       Anselm PancoastJoy, Tommye Lehenbauer C, PA-C 03/27/20 1414  Terald Sleeper, MD 03/27/20 [phone]

## 2020-03-27 NOTE — Progress Notes (Addendum)
Pt is a G9P3 at 31 4/[redacted] weeks gestation presenting with c/o cough. Pt is covid positive. She denies vaginal bleeding or leaking of fluid. She says her chest hurts when she coughs. V/S are stable and 02 sat is 97%. She is high risk because she has had preterm deliveries. She is also GDM taking metformin. The pt does not speak very good english and the ED's [employer] interpreter ipad is not working. When I asked for another one, I was told they don't have any.

## 2020-03-27 NOTE — Progress Notes (Signed)
Spoke with Dr. Vergie Living. Pt is a G9P3 at 31 4/[redacted] weeks gestation with a hx of GDM, taking metformin, hx of preterm deliveries. Pt is positive for covid and is complaining of a cough. 02 sats are 97-98%, BP is stable. She is on room air. No vaginall bleeding or leaking of fluid. FHR is reactive. Pt has had two uc's in the past 30 min. Pt is OB cleared Dr. Vergie Living says he will come by and see the pt. Okay to dc fetal monitor.

## 2020-03-27 NOTE — H&P (Signed)
Obstetrics H&P  Date of Admission: 03/27/2020   Requesting Provider: [facility]  Primary OBGYN: [facility] Primary Care Provider: No primary care provider on file.  Reason for Admission: COVID at 31wks  History of Present Illness: Ms. Aimee Benjamin is a 40 y.o. [phone] (Patient's last menstrual period was 08/01/2019.), with the above CC. PMHx is significant for AMA >40, BMI 33, GDM2.   Patient states she started having s/s on 12/31 and was officially dx on 1/5. She presented to the ED very early this morning with worsening s/s of cough, cp with cough. No OB s/s.  In the ED, she has had a cxr that showed ?peri-bronchial thickening, negative troponins x 2, normal bmp except for co2 of 16 and a neg cbc except for plts of 112  RN just did her NST and it was reactive with two rare UCs noted.   She is not vaccinated. No one at home is sick.   ROS: A 12-point review of systems was performed and negative, except as stated in the above HPI.  OBGYN History: As per HPI. OB History  Gravida Para Term Preterm AB Living  9 4 3 1 4 3   SAB IAB Ectopic Multiple Live Births  4     0 4    # Outcome Date GA Lbr Len/2nd Weight Sex Delivery Anes PTL Lv  9 Current           8 Term 04/28/16 [redacted]w[redacted]d 00:51 / 00:12 3442 g F Vag-Spont EPI  LIV  7 Term 08/14/14 [redacted]w[redacted]d 10:54 / 00:53 3930 g F Vag-Spont EPI  LIV     Birth Comments: No problems at birth  79 Term 02/16/13 110w5d 06:36 / 00:37 3107 g [redacted]w[redacted]d EPI  LIV  5 SAB 2012 [redacted]w[redacted]d       DEC  4 Preterm 12/2009 [redacted]w[redacted]d    Vag-Spont   DEC  3 SAB 10/2008 [redacted]w[redacted]d       DEC     Birth Comments: Missed AB  2 SAB 09/2008 [redacted]w[redacted]d       DEC  1 SAB 2009 [redacted]w[redacted]d       DEC     Past Medical History: Past Medical History:  Diagnosis Date  . Diabetes mellitus without complication (HCC)   . GDM, class A2 11/28/2015   PP GTT Nml  . Incompetence of cervix   . Preterm labor     Past Surgical History: Past Surgical History:  Procedure  Laterality Date  . CERVICAL CERCLAGE N/A 08/27/2012   Procedure: CERCLAGE CERVICAL;  Surgeon: 10/27/2012, MD;  Location: [facility];  Service: Gynecology;  Laterality: N/A;  . CERVICAL CERCLAGE N/A 02/09/2014   Procedure: CERCLAGE CERVICAL;  Surgeon: 02/11/2014, MD;  Location: [facility];  Service: Gynecology;  Laterality: N/A;  . CHOLECYSTECTOMY      Family History:  Family History  Problem Relation Age of Onset  . Diabetes Mother     Social History:  Social History   Socioeconomic History  . Marital status: Married    Spouse name: Not on file  . Number of children: Not on file  . Years of education: Not on file  . Highest education level: Not on file  Occupational History  . Not on file  Tobacco Use  . Smoking status: Never Smoker  . Smokeless tobacco: Never Used  Vaping Use  . Vaping Use: Never used  Substance and Sexual Activity  . Alcohol use: No  .  Drug use: Not Currently  . Sexual activity: Yes    Birth control/protection: None  Other Topics Concern  . Not on file  Social History Narrative  . Not on file   Social Determinants of Health   Financial Resource Strain: Not on file  Food Insecurity: No Food Insecurity  . Worried About Programme researcher, broadcasting/film/video in the Last Year: Never true  . Ran Out of Food in the Last Year: Never true  Transportation Needs: No Transportation Needs  . Lack of Transportation (Medical): No  . Lack of Transportation (Non-Medical): No  Physical Activity: Not on file  Stress: Not on file  Social Connections: Not on file  Intimate Partner Violence: Not on file     Allergy: Allergies  Allergen Reactions  . Aspirin Shortness Of Breath and Anxiety    But patient can take motrin   . Penicillins Shortness Of Breath, Anxiety and Other (See Comments)    Per pts husband pt had no swelling reaction w/this medication just SOB.   Has patient had a PCN reaction causing immediate rash, facial/tongue/throat swelling, SOB or  lightheadedness with hypotension: Yes Has patient had a PCN reaction causing severe rash involving mucus membranes or skin necrosis: No Has patient had a PCN reaction that required hospitalization No Has patient had a PCN reaction occurring within the last 10 years: Yes If all of the above answers are "NO", then may proceed with Cepha    Current Outpatient Medications: (Not in a hospital admission)    Hospital Medications: Current Facility-Administered Medications  Medication Dose Route Frequency Provider Last Rate Last Admin  . 0.9 %  sodium chloride infusion  250 mL Intravenous PRN [place] Bing, MD      . acetaminophen (TYLENOL) tablet 650 mg  650 mg Oral Q6H PRN [place] Bing, MD      . calcium carbonate (TUMS - dosed in mg elemental calcium) chewable tablet 400 mg of elemental calcium  2 tablet Oral Q4H PRN [place] Bing, MD      . docusate sodium (COLACE) capsule 100 mg  100 mg Oral BID PRN [place] Bing, MD      . enoxaparin (LOVENOX) injection 40 mg  40 mg Subcutaneous Q24H [place] Bing, MD      . ondansetron (ZOFRAN) tablet 4 mg  4 mg Oral Q6H PRN [place] Bing, MD       Or  . ondansetron (ZOFRAN) injection 4 mg  4 mg Intravenous Q6H PRN [place] Bing, MD      . prenatal multivitamin tablet 1 tablet  1 tablet Oral Q1200 Ty[place] Jayson, MD      . sodium chloride flush (NS) 0.9 % injection 3 mL  3 mL Intravenous Q12H [place] Bing, MD      . sodium chloride flush (NS) 0.9 % injection 3 mL  3 mL Intravenous PRN [place] Bing, MD       Current Outpatient Medications  Medication Sig Dispense Refill  . aspirin EC 81 MG tablet Take 1 tablet (81 mg total) by mouth daily. Take after 12 weeks for prevention of preeclampsia later in pregnancy 300 tablet 2  . metFORMIN (GLUCOPHAGE) 500 MG tablet Take 1 tablet (500 mg total) by mouth daily with supper. 30 tablet 5  . Prenatal Vit-Fe Fumarate-FA (PRENATAL VITAMINS) 28-0.8 MG TABS Take 1 tablet by mouth daily.        Physical Exam:  Current Vital Signs 24h Vital Sign Ranges  T 99.9 F (37.7 C) Temp  Avg: 99.5 F (  37.5 C)  Min: 99 F (37.2 C)  Max: 99.9 F (37.7 C)  BP 126/67 BP  Min: 117/76  Max: 127/78  HR 100 Pulse  Avg: 93.6  Min: 76  Max: 101  RR (!) 29 Resp  Avg: 24.2  Min: 18  Max: 31  SaO2 97 % Room Air SpO2  Avg: 98.1 %  Min: 95 %  Max: 100 %       24 Hour I/O Current Shift I/O  Time Ins Outs No intake/output data recorded. No intake/output data recorded.   Patient Vitals for the past 24 hrs:  BP Temp Temp src Pulse Resp SpO2 Height Weight  03/27/20 1413 - 99.9 F (37.7 C) Oral - - - - -  03/27/20 1330 126/67 - - 100 (!) 29 97 % - -  03/27/20 1300 117/76 - - 92 (!) 24 98 % - -  03/27/20 1230 117/79 - - 92 (!) 29 95 % - -  03/27/20 1200 120/81 - - 95 (!) 27 98 % - -  03/27/20 1146 - - - - - - 5' (1.524 m) 75.8 kg  03/27/20 1141 127/78 99.6 F (37.6 C) Oral 100 (!) 31 97 % - -  03/27/20 0841 117/73 99 F (37.2 C) Oral (!) 101 20 100 % - -  03/27/20 0741 123/73 - - 89 (!) 22 99 % - -  03/27/20 0411 122/78 - - 76 18 100 % - -  03/27/20 0057 126/80 99.4 F (37.4 C) Oral 97 18 99 % - -    Body mass index is 32.63 kg/m. General appearance: mildly lethargic Cardiovascular: S1, S2 normal, no murmur, rub or gallop, regular rate and rhythm, HR low 100s Respiratory:  RR high 20s to low 30s. Slightly increased WOB. Unable to do lung exam due to cough with inspiration. No obvious abnormal sounds noted Abdomen: gravid, nttp Neuro/Psych:  Normal mood and affect.  Skin:  Warm and dry.  Extremities: no clubbing, cyanosis, or edema.    Laboratory: As per HPI  Imaging:  As per HPI  Assessment: Ms. Aimee Benjamin is a 40 y.o. [id number] @ 31/4 with covid 19. Pt stable  Plan: I told her that she more so has concerning s/s that collectively make me recommend admission: she's AMA >40, GDMa2 vs DM2, BMI 33, hispanic and her temperature and RR are borderline. She is satting well on RA but I  told her that I feel that she would benefit from admission and treatment as she is at high risk for getting worse. I will consult the hospitalist service for co-management. I told her that they'll likely recommend steroids and potentially monoclonal antibodies or remdesivir and both of which are fine in pregnancy. Patient is amenable to plan.   Interpreter used  Total time taking care of the patient was 35 minutes, with greater than 50% of the time spent in face to face interaction with the patient.  Cornelia Copa MD Attending Center for [facility])

## 2020-03-27 NOTE — ED Triage Notes (Signed)
Pt presents to ED POV. Pt c/o cp, SOB. Pt reports that CP gets worse with cough.

## 2020-03-27 NOTE — ED Notes (Signed)
OB RN at bedside

## 2020-03-27 NOTE — Consult Note (Signed)
Medical Consultation   Aimee Benjamin  WUX:[id number]  DOB: 01/07/1981  DOA: 03/26/2020  PCP: No primary care provider on file.  Outpatient Specialists: obgyn Requesting physician: Vincente Libertyharlie Pickens OB, MD  Reason for consultation: covid 19 without hypoxia but concern for progression given comorbidities    History of Present Illness: Aimee Benjamin is an 40 y.o. female patient is a 40 year old female obese, not covid vaccinated, t2dm, [phone] (Patient's last menstrual period was 08/01/2019.)  Who is [redacted] weeks pregnant and presents to the emergency department with coughing and shortness of breath found to have COVID.  Medicine team was consulted for comanagement.  Currently symptoms started on 12/31 she tested positive on 5 January.  She is Spanish-speaking and interpreter was used to clarify what was said.  Patient does state that she has chest pain but she thinks is due to her cough.  Patient states that she does have some upper chest pain pointing to the upper sternum that she associates with the cough and is pleuritic in nature, denies any hemoptysis, no history of blood clots in the past.  It is not exertional and does not get better with rest, EKG and troponins are reassuring.  She states they have not picked a name for the baby.  She stays at home and takes care of the children she does not currently work.  She does not have any hobbies besides taking care of the children.  She never smoked  After speaking with the patient using a translator, I talked about how it is difficult to know how to treat her symptoms because it is 10 days out from symptom initiation.  I explained there are some risks with using remdesivir and its a little off indication but she agreed to using this as well as adding steroids.  She states that she has been wheezing a little bit so I asked her to try some albuterol if able to because of her pregnancy and if she noticed some improvement with her pregnancy  I might have her try an inhaled corticosteroid.  She denies a history of asthma, smoking, bronchitis or other reactive airway disease.  Patient completely agrees with the plan, expressed understanding, answered all questions with an interpreter  In the emergency department, she was 126/80, 99.4 temperature, 99% on room air, 97, RR 18, NA 134, K4.2, CO2 16, troponin was 3 and 3 which is reassuring against an MI, hCG was positive, COVID was positive, glucose was 86, EKG qtc 432 shows sinus tachycardia, rate of 103, without significant ischemic changes, CXR-showed low lung volumes and borderline cardiomegaly which was questionable with AP technique. Covid positive   Impression/Recommendations Active Problems:   COVID-19 affecting pregnancy in third trimester   Gestational thrombocytopenia (HCC)  #Wonder a component of reactive airway and will see inhalers work for her if she can use albuterol with her pregnancy. And consider inhaled corticosteroid if improvement #Severe COVID-19 with concern for worsening giving comorbidities and unvaccinated status and feel warrants hospitalization.  -Get lab work done to help monitor her progression and get baselines, including a BNP --troponins negative, EKG qtc 432 shows sinus tachycardia, rate of 103, without significant ischemic changes  -I talked with the ED pharmacist who said there wasn't really any antibody infusions for inpatient use. since she is at the 10-day mark I am not sure if she would really benefit.  I think mostly this will be to give vitamins and steroids and remdesevir -  Started on zinc and vitamin C while here and also started vitamin D 2000 units daily on top of the prenatal vitamin - Encourage incentive spirometry and flutter valve, encourage movement within her means to prevent blood clots. --pleuritic chest pain, not persistently tachycardic, do not feel the need to pull trigger on CTa of chest currently --follow up covid labs  Nonanion  gap metabolic acidosis-CTM from thrombocytopenia-112-suspect from COVID and/or gestation, continue to monitor, wonder if will improve with treatment  Type 2 diabetes mellitus-hold metformin in case a contrasted study is needed to prevent lactic acidosis and will do sliding scale insulin  Thank you for this consultation.  [facility] hospital[facility] team will follow the patient with you.   Review of Systems:  Review of Systems  Constitutional: Positive for chills, fever and malaise/fatigue.  HENT: Positive for sore throat. Negative for congestion.   Eyes: Negative for blurred vision and double vision.  Respiratory: Positive for cough, shortness of breath and wheezing. Negative for hemoptysis and sputum production.   Cardiovascular: Positive for chest pain and orthopnea. Negative for leg swelling.  Gastrointestinal: Negative for abdominal pain, blood in stool, heartburn, nausea and vomiting.  Genitourinary: Negative for dysuria and urgency.  Musculoskeletal: Positive for myalgias. Negative for falls.  Skin: Negative for itching and rash.  Neurological: Negative for dizziness, speech change and headaches.  Endo/Heme/Allergies: Does not bruise/bleed easily.  Psychiatric/Behavioral: Negative for depression. The patient is not nervous/anxious.      As per HPI otherwise 10 point review of systems negative.   Past Medical History: Past Medical History:  Diagnosis Date  . Diabetes mellitus without complication (HCC)   . GDM, class A2 11/28/2015   PP GTT Nml  . Incompetence of cervix   . Preterm labor     Past Surgical History: Past Surgical History:  Procedure Laterality Date  . CERVICAL CERCLAGE N/A 08/27/2012   Procedure: CERCLAGE CERVICAL;  Surgeon: Kathreen CosierBernard A Marshall, MD;  Location: [facility];  Service: Gynecology;  Laterality: N/A;  . CERVICAL CERCLAGE N/A 02/09/2014   Procedure: CERCLAGE CERVICAL;  Surgeon: Kathreen CosierBernard A Marshall, MD;  Location: [facility];  Service: Gynecology;  Laterality: N/A;   . CHOLECYSTECTOMY       Allergies:   Allergies  Allergen Reactions  . Aspirin Shortness Of Breath and Anxiety    Pt is currently taking aspirin 81 with no problem (03/27/2020)  . Penicillins Shortness Of Breath, Anxiety and Other (See Comments)    Per pts husband pt had no swelling reaction w/this medication just SOB.   Has patient had a PCN reaction causing immediate rash, facial/tongue/throat swelling, SOB or lightheadedness with hypotension: Yes Has patient had a PCN reaction causing severe rash involving mucus membranes or skin necrosis: No Has patient had a PCN reaction that required hospitalization No Has patient had a PCN reaction occurring within the last 10 years: Yes If all of the above answers are "NO", then may proceed with Cepha     Social History:  reports that she has never smoked. She has never used smokeless tobacco. She reports previous drug use. She reports that she does not drink alcohol.   Family History: Family History  Problem Relation Age of Onset  . Diabetes Mother     Physical Exam: Vitals:   03/27/20 1300 03/27/20 1330 03/27/20 1400 03/27/20 1413  BP: 117/76 126/67 115/80   Pulse: 92 100 89   Resp: (!) 24 (!) 29 (!) 32   Temp:    99.9 F (37.7 C)  TempSrc:    Oral  SpO2: 98% 97% 98%   Weight:      Height:        Constitutional: Oriented appearing, Eyes: PERLA, EOMI, irises appear normal, anicteric sclera,  ENMT: external ears and nose appear normal, normal hearing            Lips appears normal, wearing a mask Neck: neck appears normal, no masses, normal ROM, no thyromegaly, no JVD  CVS: S1-S2 clear, no murmur rubs or gallops, no LE edema, normal pedal pulses  Respiratory:  clear to auscultation bilaterally, no wheezing, rales or rhonchi. Respiratory effort normal. No accessory muscle use.  Abdomen: soft nontender, nondistended, normal bowel sounds, no hepatosplenomegaly, no hernias  Musculoskeletal: : no cyanosis, clubbing or edema noted  bilaterally                       Joint/bones/muscle exam, strength, contractures or atrophy Neuro: Cranial nerves II-XII intact, strength, sensation, reflexes Psych: judgement and insight appear normal, stable mood and affect, mental status Skin: no rashes or lesions or ulcers, no induration or nodules    Patient looks mildly uncomfortable, she is Spanish-speaking, all in all normal work of breathing but appears that she does not feel well Regular rate and rhythm without any murmurs rubs or gallops, distant breath sounds but no rhonchi or coarse Rales noted, she does cough, normal bowel sounds, pregnant, no lower extremity edema, no calf tenderness noted, ambulating to the toilet okay, mentating and speaking appropriately, cranial nerves II through XII grossly intact, moving all 4 extremities, strength in upper and lower extremities: Grossly intact, no skin lesions noted   Data reviewed:  I have personally reviewed following labs and imaging studies Labs:  CBC: Recent Labs  Lab 03/27/20 0112  WBC 5.2  HGB 13.6  HCT 39.9  MCV 88.7  PLT 112*    Basic Metabolic Panel: Recent Labs  Lab 03/27/20 0112  NA 134*  K 4.2  CL 107  CO2 16*  GLUCOSE 86  BUN 9  CREATININE 0.61  CALCIUM 8.2*   GFR Estimated Creatinine Clearance: 85 mL/min (by C-G formula based on SCr of 0.61 mg/dL). Liver Function Tests: No results for input(s): AST, ALT, ALKPHOS, BILITOT, PROT, ALBUMIN in the last 168 hours. No results for input(s): LIPASE, AMYLASE in the last 168 hours. No results for input(s): AMMONIA in the last 168 hours. Coagulation profile No results for input(s): INR, PROTIME in the last 168 hours.  Cardiac Enzymes: No results for input(s): CKTOTAL, CKMB, CKMBINDEX, TROPONINI in the last 168 hours. BNP: Invalid input(s): POCBNP CBG: No results for input(s): GLUCAP in the last 168 hours. D-Dimer No results for input(s): DDIMER in the last 72 hours. Hgb A1c No results for input(s):  HGBA1C in the last 72 hours. Lipid Profile No results for input(s): CHOL, HDL, LDLCALC, TRIG, CHOLHDL, LDLDIRECT in the last 72 hours. Thyroid function studies No results for input(s): TSH, T4TOTAL, T3FREE, THYROIDAB in the last 72 hours.  Invalid input(s): FREET3 Anemia work up No results for input(s): VITAMINB12, FOLATE, FERRITIN, TIBC, IRON, RETICCTPCT in the last 72 hours. Urinalysis    Component Value Date/Time   COLORURINE YELLOW 08/31/2012 1004   APPEARANCEUR HAZY (A) 08/31/2012 1004   LABSPEC 1.020 02/19/2020 1035   PHURINE 7.0 02/19/2020 1035   GLUCOSEU NEGATIVE 02/19/2020 1035   HGBUR NEGATIVE 02/19/2020 1035   BILIRUBINUR NEGATIVE 02/19/2020 1035   KETONESUR NEGATIVE 02/19/2020 1035   PROTEINUR NEGATIVE 02/19/2020 1035  UROBILINOGEN 0.2 02/19/2020 1035   NITRITE NEGATIVE 02/19/2020 1035   LEUKOCYTESUR TRACE (A) 02/19/2020 1035     Microbiology Recent Results (from the past 240 hour(s))  Resp Panel by RT-PCR (Flu A&B, Covid) Nasopharyngeal Swab     Status: Abnormal   Collection Time: 03/27/20  1:14 AM   Specimen: Nasopharyngeal Swab; Nasopharyngeal(NP) swabs in vial transport medium  Result Value Ref Range Status   SARS Coronavirus 2 by RT PCR POSITIVE (A) NEGATIVE Final    Comment: RESULT CALLED TO, READ BACK BY AND VERIFIED WITH: RN FLORES MELANIE AT 0237 BY MESSAN H. ON 03/27/2020 (NOTE) SARS-CoV-2 target nucleic acids are DETECTED.  The SARS-CoV-2 RNA is generally detectable in upper respiratory specimens during the acute phase of infection. Positive results are indicative of the presence of the identified virus, but do not rule out bacterial infection or co-infection with other pathogens not detected by the test. Clinical correlation with patient history and other diagnostic information is necessary to determine patient infection status. The expected result is Negative.  Fact Sheet for Patients: BloggerCourse.com  Fact Sheet for  Healthcare Providers: SeriousBroker.it  This test is not yet approved or cleared by the [place] FDA and  has been authorized for detection and/or diagnosis of SARS-CoV-2 by FDA under an Emergency Use Authorization (EUA).  This EUA will remain in effect (meaning  this test can be used) for the duration of  the COVID-19 declaration under Section 564(b)(1) of the Act, 21 U.S.C. section 360bbb-3(b)(1), unless the authorization is terminated or revoked sooner.     Influenza A by PCR NEGATIVE NEGATIVE Final   Influenza B by PCR NEGATIVE NEGATIVE Final    Comment: (NOTE) The Xpert Xpress SARS-CoV-2/FLU/RSV plus assay is intended as an aid in the diagnosis of influenza from Nasopharyngeal swab specimens and should not be used as a sole basis for treatment. Nasal washings and aspirates are unacceptable for Xpert Xpress SARS-CoV-2/FLU/RSV testing.  Fact Sheet for Patients: BloggerCourse.com  Fact Sheet for Healthcare Providers: SeriousBroker.it  This test is not yet approved or cleared by the [place] FDA and has been authorized for detection and/or diagnosis of SARS-CoV-2 by FDA under an Emergency Use Authorization (EUA). This EUA will remain in effect (meaning this test can be used) for the duration of the COVID-19 declaration under Section 564(b)(1) of the Act, 21 U.S.C. section 360bbb-3(b)(1), unless the authorization is terminated or revoked.  Performed at [facility], [address], [place], [place] [address]        Inpatient Medications:   Scheduled Meds: . vitamin C  500 mg Oral Daily  . cholecalciferol  1,000 Units Oral Daily  . enoxaparin (LOVENOX) injection  40 mg Subcutaneous Q24H  . insulin aspart  0-12 Units Subcutaneous TID PC  . prenatal multivitamin  1 tablet Oral Q1200  . sodium chloride flush  3 mL Intravenous Q12H  . zinc sulfate  220 mg Oral Daily   Continuous  Infusions: . sodium chloride       Radiological Exams on Admission: DG Chest Portable 1 View  Result Date: 03/27/2020 CLINICAL DATA:  Shortness of breath. EXAM: PORTABLE CHEST 1 VIEW COMPARISON:  None. FINDINGS: Lung volumes are low. Bronchovascular crowding related to low lung volumes. There may be underlying peribronchial thickening. Upper normal heart size likely accentuated by AP technique. Normal mediastinal contours. No confluent consolidation. No pleural fluid. No pneumothorax. No acute osseous abnormalities are seen. IMPRESSION: 1. Low lung volumes with bronchovascular crowding. Possible underlying peribronchial  thickening. 2. Upper normal heart size likely accentuated by AP technique. Electronically Signed   By: Narda Rutherford M.D.   On: 03/27/2020 01:40    Time Spent: [id number] DO [facility] 03/27/2020, 3:18 PM

## 2020-03-28 ENCOUNTER — Telehealth: Payer: Self-pay

## 2020-03-28 DIAGNOSIS — U071 COVID-19: Secondary | ICD-10-CM

## 2020-03-28 LAB — CBC WITH DIFFERENTIAL/PLATELET
Abs Immature Granulocytes: 0.04 10*3/uL (ref 0.00–0.07)
Basophils Absolute: 0 10*3/uL (ref 0.0–0.1)
Basophils Relative: 0 %
Eosinophils Absolute: 0 10*3/uL (ref 0.0–0.5)
Eosinophils Relative: 0 %
HCT: 40 % (ref 36.0–46.0)
Hemoglobin: 13.8 g/dL (ref 12.0–15.0)
Immature Granulocytes: 1 %
Lymphocytes Relative: 33 %
Lymphs Abs: 1.1 10*3/uL (ref 0.7–4.0)
MCH: 30.3 pg (ref 26.0–34.0)
MCHC: 34.5 g/dL (ref 30.0–36.0)
MCV: 87.7 fL (ref 80.0–100.0)
Monocytes Absolute: 0.3 10*3/uL (ref 0.1–1.0)
Monocytes Relative: 8 %
Neutro Abs: 2 10*3/uL (ref 1.7–7.7)
Neutrophils Relative %: 58 %
Platelets: 127 10*3/uL — ABNORMAL LOW (ref 150–400)
RBC: 4.56 MIL/uL (ref 3.87–5.11)
RDW: 13 % (ref 11.5–15.5)
WBC: 3.5 10*3/uL — ABNORMAL LOW (ref 4.0–10.5)
nRBC: 0 % (ref 0.0–0.2)

## 2020-03-28 LAB — MAGNESIUM: Magnesium: 2 mg/dL (ref 1.7–2.4)

## 2020-03-28 LAB — C-REACTIVE PROTEIN: CRP: 4.1 mg/dL — ABNORMAL HIGH (ref <1.0)

## 2020-03-28 LAB — COMPREHENSIVE METABOLIC PANEL
ALT: 37 U/L (ref 0–44)
AST: 40 U/L (ref 15–41)
Albumin: 2.4 g/dL — ABNORMAL LOW (ref 3.5–5.0)
Alkaline Phosphatase: 105 U/L (ref 38–126)
Anion gap: 13 (ref 5–15)
BUN: 7 mg/dL (ref 6–20)
CO2: 14 mmol/L — ABNORMAL LOW (ref 22–32)
Calcium: 8 mg/dL — ABNORMAL LOW (ref 8.9–10.3)
Chloride: 107 mmol/L (ref 98–111)
Creatinine, Ser: 0.53 mg/dL (ref 0.44–1.00)
GFR, Estimated: >60 mL/min (ref >60)
Glucose, Bld: 89 mg/dL (ref 70–99)
Potassium: 3.7 mmol/L (ref 3.5–5.1)
Sodium: 134 mmol/L — ABNORMAL LOW (ref 135–145)
Total Bilirubin: 0.5 mg/dL (ref 0.3–1.2)
Total Protein: 6.1 g/dL — ABNORMAL LOW (ref 6.5–8.1)

## 2020-03-28 LAB — GLUCOSE, CAPILLARY
Glucose-Capillary: 99 mg/dL (ref 70–99)
Glucose-Capillary: 111 mg/dL — ABNORMAL HIGH (ref 70–99)
Glucose-Capillary: 200 mg/dL — ABNORMAL HIGH (ref 70–99)

## 2020-03-28 LAB — D-DIMER, QUANTITATIVE: D-Dimer, Quant: 0.55 ug/mL-FEU — ABNORMAL HIGH (ref 0.00–0.50)

## 2020-03-28 LAB — PROCALCITONIN: Procalcitonin: 0.34 ng/mL

## 2020-03-28 LAB — BRAIN NATRIURETIC PEPTIDE: B Natriuretic Peptide: 24.2 pg/mL (ref 0.0–100.0)

## 2020-03-28 NOTE — Telephone Encounter (Signed)
03/29/20 GFE being mailed 

## 2020-03-28 NOTE — Progress Notes (Signed)
PROGRESS NOTE                                                                                                                                                                                                             Patient Demographics:    Aimee Benjamin, is a 40 y.o. female, DOB - 03-May-1980, JJO:[id number]  Outpatient Primary MD for the patient is No primary care provider on file.    LOS - 1  Admit date - 03/26/2020    Chief Complaint  Patient presents with  . Chest Pain  . Covid Positive       Brief Narrative (HPI from H&P) - Aimee Benjamin is a 40 y.o. [id number] (Patient's last menstrual period was 08/01/2019.), with the above CC. PMHx is significant for AMA >40, BMI 33, GDM2, she was admitted to the [facility] unit with symptoms of cough and mild exertional shortness of breath, she was diagnosed with COVID-19 pneumonia and admitted to the [facility] hospital.  [facility] was consulted for Covid management.   Subjective:    Aimee Benjamin today has, No headache, No chest pain, No abdominal pain - No Nausea, No new weakness tingling or numbness, mild cough but no shortness of breath.   Assessment  & Plan :     1. Acute Covid 19 Viral Pneumonitis - she is unfortunately not vaccinated and seems to have incurred mild to moderate parenchymal lung injury, on IV steroids and Remdesivir and doing fairly well.  Continue to monitor closely.  Encouraged the patient to sit up in chair in the daytime use I-S and flutter valve for pulmonary toiletry and then prone in bed when at night.  Will advance activity and titrate down oxygen as possible.   SpO2: 99 %  Recent Labs  Lab 03/27/20 0112 03/27/20 0114 03/27/20 1718 03/28/20 0749  WBC 5.2  --   --  3.5*  HGB 13.6  --   --  13.8  HCT 39.9  --   --  40.0  PLT 112*  --   --  127*  CRP  --   --  5.0* 4.1*  BNP  --   --  20.3  --   DDIMER  --   --  0.92* 0.55*  PROCALCITON  --   --   0.45  --   AST  --   --  49* 40  ALT  --   --  40 37  ALKPHOS  --   --  96 105  BILITOT  --   --  0.6 0.5  ALBUMIN  --   --  2.6* 2.4*  SARSCOV2NAA  --  POSITIVE*  --   --    Lab Results  Component Value Date   HGBA1C 5.2 02/19/2020   CBG (last 3)  Recent Labs    03/27/20 1800 03/27/20 2158  GLUCAP 77 149*     2.  [redacted] weeks pregnant, type 2 diabetes.  Defer to primary team which is OB.         Condition - Fair  Family Communication  :  None bedside  Code Status :  Full  Consults  :  [facility] consulting for primary team OB.  Procedures  :    PUD Prophylaxis : None   DVT Prophylaxis  :  Lovenox   Lab Results  Component Value Date   PLT 127 (L) 03/28/2020    Diet :  Diet Order            Diet gestational carb mod Fluid consistency: Thin; Room service appropriate? Yes  Diet effective now                  Inpatient Medications  Scheduled Meds: . albuterol  2 puff Inhalation Q6H  . vitamin C  500 mg Oral Daily  . cholecalciferol  1,000 Units Oral Daily  . dexamethasone (DECADRON) injection  6 mg Intravenous Q24H  . enoxaparin (LOVENOX) injection  40 mg Subcutaneous Q24H  . fluticasone  1 spray Each Nare BID  . insulin aspart  0-12 Units Subcutaneous TID PC  . prenatal multivitamin  1 tablet Oral Q1200  . sodium chloride  1 spray Each Nare TID  . sodium chloride flush  3 mL Intravenous Q12H  . zinc sulfate  220 mg Oral Daily   Continuous Infusions: . sodium chloride Stopped (03/27/20 2035)  . remdesivir 100 mg in NS 100 mL     PRN Meds:.sodium chloride, acetaminophen, calcium carbonate, docusate sodium, [DISCONTINUED] ondansetron **OR** ondansetron (ZOFRAN) IV, sodium chloride flush  Antibiotics  :    Anti-infectives (From admission, onward)   Start     Dose/Rate Route Frequency Ordered Stop   03/28/20 1000  remdesivir 100 mg in sodium chloride 0.9 % 100 mL IVPB       "Followed by" Linked Group Details   100 mg 200 mL/hr over 30 Minutes  Intravenous Daily 03/27/20 1651 04/01/20 0959   03/27/20 1800  remdesivir 200 mg in sodium chloride 0.9% 250 mL IVPB       "Followed by" Linked Group Details   200 mg 580 mL/hr over 30 Minutes Intravenous Once 03/27/20 1651 03/27/20 2034       Time Spent in minutes  30   Susa Raring M.D on 03/28/2020 at 9:39 AM  To page go to [email or web address]   [facility] -  Office  [phone]    See all Orders from today for further details    Objective:   Vitals:   03/28/20 0400 03/28/20 0504 03/28/20 0600 03/28/20 0756  BP:  116/68  108/75  Pulse:  76  77  Resp:    18  Temp:  97.7 F (36.5 C)  98.2 F (36.8 C)  TempSrc:  Oral  Oral  SpO2: 98% 99% 98% 99%  Weight:      Height:  Wt Readings from Last 3 Encounters:  03/27/20 75.8 kg  03/23/20 75.8 kg  03/03/20 76.2 kg     Intake/Output Summary (Last 24 hours) at 03/28/2020 0939 Last data filed at 03/28/2020 0810 Gross per 24 hour  Intake 5.27 ml  Output 0 ml  Net 5.27 ml     Physical Exam  Awake Alert, No new F.N deficits, Normal affect [place].AT,PERRAL Supple Neck,No JVD, No cervical lymphadenopathy appriciated.  Symmetrical Chest wall movement, Good air movement bilaterally, CTAB RRR,No Gallops,Rubs or new Murmurs, No Parasternal Heave +ve B.Sounds, Abd Soft, No tenderness, No organomegaly appriciated, No rebound - guarding or rigidity. No Cyanosis, Clubbing or edema, No new Rash or bruise      Data Review:    CBC Recent Labs  Lab 03/27/20 0112 03/28/20 0749  WBC 5.2 3.5*  HGB 13.6 13.8  HCT 39.9 40.0  PLT 112* 127*  MCV 88.7 87.7  MCH 30.2 30.3  MCHC 34.1 34.5  RDW 12.9 13.0  LYMPHSABS  --  PENDING  MONOABS  --  PENDING  EOSABS  --  PENDING  BASOSABS  --  PENDING    Recent Labs  Lab 03/27/20 0112 03/27/20 1718 03/28/20 0749  NA 134*  --  134*  K 4.2  --  3.7  CL 107  --  107  CO2 16*  --  14*  GLUCOSE 86  --  89  BUN 9  --  7  CREATININE 0.61  --  0.53  CALCIUM 8.2*  --   8.0*  AST  --  49* 40  ALT  --  40 37  ALKPHOS  --  96 105  BILITOT  --  0.6 0.5  ALBUMIN  --  2.6* 2.4*  MG  --   --  2.0  CRP  --  5.0* 4.1*  DDIMER  --  0.92* 0.55*  PROCALCITON  --  0.45  --   BNP  --  20.3  --     ------------------------------------------------------------------------------------------------------------------ No results for input(s): CHOL, HDL, LDLCALC, TRIG, CHOLHDL, LDLDIRECT in the last 72 hours.  Lab Results  Component Value Date   HGBA1C 5.2 02/19/2020   ------------------------------------------------------------------------------------------------------------------ No results for input(s): TSH, T4TOTAL, T3FREE, THYROIDAB in the last 72 hours.  Invalid input(s): FREET3  Cardiac Enzymes No results for input(s): CKMB, TROPONINI, MYOGLOBIN in the last 168 hours.  Invalid input(s): CK ------------------------------------------------------------------------------------------------------------------    Component Value Date/Time   BNP 20.3 03/27/2020 1718    Micro Results Recent Results (from the past 240 hour(s))  Resp Panel by RT-PCR (Flu A&B, Covid) Nasopharyngeal Swab     Status: Abnormal   Collection Time: 03/27/20  1:14 AM   Specimen: Nasopharyngeal Swab; Nasopharyngeal(NP) swabs in vial transport medium  Result Value Ref Range Status   SARS Coronavirus 2 by RT PCR POSITIVE (A) NEGATIVE Final    Comment: RESULT CALLED TO, READ BACK BY AND VERIFIED WITH: RN FLORES MELANIE AT 0237 BY MESSAN H. ON 03/27/2020 (NOTE) SARS-CoV-2 target nucleic acids are DETECTED.  The SARS-CoV-2 RNA is generally detectable in upper respiratory specimens during the acute phase of infection. Positive results are indicative of the presence of the identified virus, but do not rule out bacterial infection or co-infection with other pathogens not detected by the test. Clinical correlation with patient history and other diagnostic information is necessary to determine  patient infection status. The expected result is Negative.  Fact Sheet for Patients: BloggerCourse.com  Fact Sheet for Healthcare Providers: SeriousBroker.it  This test  is not yet approved or cleared by the [place] and  has been authorized for detection and/or diagnosis of SARS-CoV-2 by FDA under an Emergency Use Authorization (EUA).  This EUA will remain in effect (meaning  this test can be used) for the duration of  the COVID-19 declaration under Section 564(b)(1) of the Act, 21 U.S.C. section 360bbb-3(b)(1), unless the authorization is terminated or revoked sooner.     Influenza A by PCR NEGATIVE NEGATIVE Final   Influenza B by PCR NEGATIVE NEGATIVE Final    Comment: (NOTE) The Xpert Xpress SARS-CoV-2/FLU/RSV plus assay is intended as an aid in the diagnosis of influenza from Nasopharyngeal swab specimens and should not be used as a sole basis for treatment. Nasal washings and aspirates are unacceptable for Xpert Xpress SARS-CoV-2/FLU/RSV testing.  Fact Sheet for Patients: BloggerCourse.com  Fact Sheet for Healthcare Providers: SeriousBroker.it  This test is not yet approved or cleared by the [place] FDA and has been authorized for detection and/or diagnosis of SARS-CoV-2 by FDA under an Emergency Use Authorization (EUA). This EUA will remain in effect (meaning this test can be used) for the duration of the COVID-19 declaration under Section 564(b)(1) of the Act, 21 U.S.C. section 360bbb-3(b)(1), unless the authorization is terminated or revoked.  Performed at [facility], [address] [place], [place] [address]     Radiology Reports DG Chest Portable 1 View  Result Date: 03/27/2020 CLINICAL DATA:  Shortness of breath. EXAM: PORTABLE CHEST 1 VIEW COMPARISON:  None. FINDINGS: Lung volumes are low. Bronchovascular crowding related to low lung  volumes. There may be underlying peribronchial thickening. Upper normal heart size likely accentuated by AP technique. Normal mediastinal contours. No confluent consolidation. No pleural fluid. No pneumothorax. No acute osseous abnormalities are seen. IMPRESSION: 1. Low lung volumes with bronchovascular crowding. Possible underlying peribronchial thickening. 2. Upper normal heart size likely accentuated by AP technique. Electronically Signed   By: Narda Rutherford M.D.   On: 03/27/2020 01:40   [place] MFM OB FOLLOW UP  Result Date: 03/14/2020 ----------------------------------------------------------------------  OBSTETRICS REPORT                       (Signed Final 03/14/2020 09:26 am) ---------------------------------------------------------------------- Patient Info  ID #:       [id number]                          D.O.B.:  13-Apr-1980 (39 yrs)  Name:       Aimee Benjamin               Visit Date: 03/14/2020 09:00 am ---------------------------------------------------------------------- Performed By  Attending:        Noralee Space MD        Ref. Address:     [address]                                                             [place], [place]  [address]  Performed By:     Jenel Lucks     [facility]  Referred By:      [facility] ---------------------------------------------------------------------- Orders  #  Description                           Code        Ordered By  1  [place] MFM OB FOLLOW UP                   [phone]    Noralee Space ----------------------------------------------------------------------  #  Order #                     Accession #                Episode #  1   [id number]                   [id number]                 [id number] ---------------------------------------------------------------------- Indications  [redacted] weeks gestation of pregnancy                Z3A.29  Gestational diabetes in pregnancy,             O24.415  controlled by oral hypoglycemic drugs  (metformin)  Advanced maternal age multigravida 11+,        O20.523  third trimester (39yo)  Poor obstetric history: Hx of preterm          O09.219  delivery, currently pregnant  Poor obstetrical history: 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  Obesity complicating pregnancy, third          O99.213  trimester (BMI 30) ---------------------------------------------------------------------- Fetal Evaluation  Num Of Fetuses:         1  Fetal Heart Rate(bpm):  126  Cardiac Activity:       Observed  Presentation:           Variable  Placenta:               Posterior  Amniotic Fluid  AFI FV:      Within normal limits  AFI Sum(cm)     %Tile       Largest Pocket(cm)  12.58           34          6.61  RUQ(cm)       RLQ(cm)       LUQ(cm)        LLQ(cm)  6.61          1.83          0              4.14 ---------------------------------------------------------------------- Biometry  BPD:      78.4  mm     G. Age:  31w 3d         87  %    CI:        75.83   %    70 - 86                                                          FL/HC:      19.8   %    19.2 - 21.4  HC:      285.4  mm     G. Age:  31w 2d         64  %    HC/AC:      1.05        0.99 - 1.21  AC:      272.3  mm     G. Age:  31w 2d         87  %    FL/BPD:     72.1   %    71 - 87  FL:       56.5  mm     G. Age:  29w 5d         34  %    FL/AC:      20.7   %    20 - 24  LV:        4.5  mm  Est. FW:    1645  gm    3 lb 10 oz      77  % ---------------------------------------------------------------------- OB History  Gravidity:    9         Term:   3  Living:       3 ---------------------------------------------------------------------- Gestational Age  LMP:           32w 2d         Date:  08/01/19                 EDD:   05/07/20  U/S Today:     31w 0d                                        EDD:   05/16/20  Best:          29w 5d     Det. ByMarcella Dubs:  Early Ultrasound         EDD:   05/25/20                                      (  01/11/20) ---------------------------------------------------------------------- Anatomy  Cranium:               Appears normal         LVOT:                   Appears normal  Cavum:                 Previously seen        Aortic Arch:            Appears normal  Ventricles:            Appears normal         Ductal Arch:            Appears normal  Choroid Plexus:        Previously seen        Diaphragm:              Appears normal  Cerebellum:            Previously seen        Stomach:                Appears normal, left                                                                        sided  Posterior Fossa:       Previously seen        Abdomen:                Appears normal  Nuchal Fold:           Not applicable (>20    Abdominal Wall:         Previously seen                         wks GA)  Face:                  Orbits and profile     Cord Vessels:           Appears normal (3                         previously seen                                vessel cord)  Lips:                  Appears normal         Kidneys:                Appear normal  Palate:                Appears normal         Bladder:                Appears normal  Thoracic:              Appears normal         Spine:  Limited views of                                                                        cspine  Heart:                 Appears normal         Upper Extremities:      Limited views                         (4CH, axis, and                         situs)  RVOT:                  Appears normal         Lower Extremities:      Previously seen  Other:  Unable to obtain images of cspine and right ulna/radius and hand,          due to fetal position  ---------------------------------------------------------------------- Cervix Uterus Adnexa  Cervix  Length:           4.86  cm.  Normal appearance by transabdominal scan.  Uterus  No abnormality visualized.  Right Ovary  Within normal limits.  Left Ovary  Within normal limits. ---------------------------------------------------------------------- Impression  Gestational diabetes.  Patient takes Metformin for control.  Blood pressure today at her office is 116/71 mmHg.  Fetal growth is appropriate for gestational age .Amniotic fluid  is normal and good fetal activity is seen .  Fetal anatomical  survey was completed and appeared normal (limited views of  upper extremity). ---------------------------------------------------------------------- Recommendations  BPP in 3 weeks and then weekly till delivery. ----------------------------------------------------------------------                  Noralee Spaceavi Shankar, MD Electronically Signed Final Report   03/14/2020 09:26 am ----------------------------------------------------------------------

## 2020-03-28 NOTE — Progress Notes (Signed)
FACULTY PRACTICE ANTEPARTUM PROGRESS NOTE  Aimee Benjamin is a 40 y.o. [phone] at [id number] who is admitted for COVID infection without hypoxia in pregnancy.  Estimated Date of Delivery: 05/25/20 Fetal presentation is unsure.  Length of Stay:  1 Days. Admitted 03/26/2020  Subjective: Patient reports feeling short of breath although slightly better than on arrival. No other complaints. Patient reports normal fetal movement.  She denies uterine contractions, denies bleeding and leaking of fluid per vagina.  Vitals:  Blood pressure 108/75, pulse 77, temperature 98.2 F (36.8 C), temperature source Oral, resp. rate 18, height 5' (1.524 m), weight 75.8 kg, last menstrual period 08/01/2019, SpO2 99 %, unknown if currently breastfeeding. Physical Examination: CONSTITUTIONAL: Well-developed, well-nourished female in mild distress, appears extremely fatigued.  HENT:  Normocephalic, atraumatic, External right and left ear normal. Oropharynx is clear and moist EYES: Conjunctivae and EOM are normal. Pupils are equal, round, and reactive to light. No scleral icterus.  NECK: Normal range of motion, supple, no masses. SKIN: Skin is warm and dry. No rash noted. Not diaphoretic. No erythema. No pallor. NEUROLGIC: Alert and oriented to person, place, and time. Normal reflexes, muscle tone coordination. No cranial nerve deficit noted. PSYCHIATRIC: Normal mood and affect. Normal behavior. Normal judgment and thought content. CARDIOVASCULAR: Normal heart rate noted RESPIRATORY: Effort normal, no problems with respiration noted MUSCULOSKELETAL: Normal range of motion. No edema and no tenderness. ABDOMEN: Soft, nontender, nondistended, gravid. CERVIX: deferred  Fetal monitoring: FHR: 110 bpm, Variability: moderate, Accelerations: Present, Decelerations: occasional  Uterine activity: no contractions per hour  Results for orders placed or performed during the hospital encounter of 03/26/20 (from the past 48 hour(s))   Basic metabolic panel     Status: Abnormal   Collection Time: 03/27/20  1:12 AM  Result Value Ref Range   Sodium 134 (L) 135 - 145 mmol/L   Potassium 4.2 3.5 - 5.1 mmol/L   Chloride 107 98 - 111 mmol/L   CO2 16 (L) 22 - 32 mmol/L   Glucose, Bld 86 70 - 99 mg/dL    Comment: Glucose reference range applies only to samples taken after fasting for at least 8 hours.   BUN 9 6 - 20 mg/dL   Creatinine, Ser 5.620.61 0.44 - 1.00 mg/dL   Calcium 8.2 (L) 8.9 - 10.3 mg/dL   GFR, Estimated >13>60 >08>60 mL/min    Comment: (NOTE) Calculated using the CKD-EPI Creatinine Equation (2021)    Anion gap 11 5 - 15    Comment: Performed at [facility], [address] [place], [place] [address]  CBC     Status: Abnormal   Collection Time: 03/27/20  1:12 AM  Result Value Ref Range   WBC 5.2 4.0 - 10.5 K/uL   RBC 4.50 3.87 - 5.11 MIL/uL   Hemoglobin 13.6 12.0 - 15.0 g/dL   HCT 69.639.9 29.536.0 - 28.446.0 %   MCV 88.7 80.0 - 100.0 fL   MCH 30.2 26.0 - 34.0 pg   MCHC 34.1 30.0 - 36.0 g/dL   RDW 13.212.9 44.011.5 - 10.215.5 %   Platelets 112 (L) 150 - 400 K/uL    Comment: REPEATED TO VERIFY PLATELET COUNT CONFIRMED BY SMEAR SPECIMEN CHECKED FOR CLOTS Immature Platelet Fraction may be clinically indicated, consider ordering this additional test [id number]    nRBC 0.0 0.0 - 0.2 %    Comment: Performed at [facility], [address] [place], [place] [address]  Troponin I (High Sensitivity)     Status: None  Collection Time: 03/27/20  1:12 AM  Result Value Ref Range   Troponin I (High Sensitivity) 3 <18 ng/L    Comment: (NOTE) Elevated high sensitivity troponin I (hsTnI) values and significant  changes across serial measurements may suggest ACS but many other  chronic and acute conditions are known to elevate hsTnI results.  Refer to the "Links" section for chest pain algorithms and additional  guidance. Performed at [facility], [address], [place], [place] [address]   Resp Panel by RT-PCR (Flu A&B,  Covid) Nasopharyngeal Swab     Status: Abnormal   Collection Time: 03/27/20  1:14 AM   Specimen: Nasopharyngeal Swab; Nasopharyngeal(NP) swabs in vial transport medium  Result Value Ref Range   SARS Coronavirus 2 by RT PCR POSITIVE (A) NEGATIVE    Comment: RESULT CALLED TO, READ BACK BY AND VERIFIED WITH: RN FLORES MELANIE AT 0237 BY MESSAN H. ON 03/27/2020 (NOTE) SARS-CoV-2 target nucleic acids are DETECTED.  The SARS-CoV-2 RNA is generally detectable in upper respiratory specimens during the acute phase of infection. Positive results are indicative of the presence of the identified virus, but do not rule out bacterial infection or co-infection with other pathogens not detected by the test. Clinical correlation with patient history and other diagnostic information is necessary to determine patient infection status. The expected result is Negative.  Fact Sheet for Patients: BloggerCourse.com  Fact Sheet for Healthcare Providers: SeriousBroker.it  This test is not yet approved or cleared by the [place] FDA and  has been authorized for detection and/or diagnosis of SARS-CoV-2 by FDA under an Emergency Use Authorization (EUA).  This EUA will remain in effect (meaning  this test can be used) for the duration of  the COVID-19 declaration under Section 564(b)(1) of the Act, 21 U.S.C. section 360bbb-3(b)(1), unless the authorization is terminated or revoked sooner.     Influenza A by PCR NEGATIVE NEGATIVE   Influenza B by PCR NEGATIVE NEGATIVE    Comment: (NOTE) The Xpert Xpress SARS-CoV-2/FLU/RSV plus assay is intended as an aid in the diagnosis of influenza from Nasopharyngeal swab specimens and should not be used as a sole basis for treatment. Nasal washings and aspirates are unacceptable for Xpert Xpress SARS-CoV-2/FLU/RSV testing.  Fact Sheet for Patients: BloggerCourse.com  Fact Sheet for  Healthcare Providers: SeriousBroker.it  This test is not yet approved or cleared by the [place] FDA and has been authorized for detection and/or diagnosis of SARS-CoV-2 by FDA under an Emergency Use Authorization (EUA). This EUA will remain in effect (meaning this test can be used) for the duration of the COVID-19 declaration under Section 564(b)(1) of the Act, 21 U.S.C. section 360bbb-3(b)(1), unless the authorization is terminated or revoked.  Performed at [facility], [address], [place], [place] [address]   I-Stat beta hCG blood, ED     Status: Abnormal   Collection Time: 03/27/20  1:25 AM  Result Value Ref Range   I-stat hCG, quantitative >2,000.0 (H) <5 mIU/mL   Comment 3            Comment:   GEST. AGE      CONC.  (mIU/mL)   <=1 WEEK        5 - 50     2 WEEKS       50 - 500     3 WEEKS       100 - 10,000     4 WEEKS     1,000 - 30,000  FEMALE AND NON-PREGNANT FEMALE:     LESS THAN 5 mIU/mL   Troponin I (High Sensitivity)     Status: None   Collection Time: 03/27/20  4:30 AM  Result Value Ref Range   Troponin I (High Sensitivity) 3 <18 ng/L    Comment: (NOTE) Elevated high sensitivity troponin I (hsTnI) values and significant  changes across serial measurements may suggest ACS but many other  chronic and acute conditions are known to elevate hsTnI results.  Refer to the "Links" section for chest pain algorithms and additional  guidance. Performed at [facility], [address] [place], [place] [address]   Type and screen [facility]     Status: None   Collection Time: 03/27/20  5:18 PM  Result Value Ref Range   ABO/RH(D) O POS    Antibody Screen NEG    Sample Expiration      03/30/2020,2359 Performed at [facility], [address], [place], [place] [address]   C-reactive protein     Status: Abnormal   Collection Time: 03/27/20  5:18 PM  Result Value Ref Range   CRP 5.0 (H) <1.0  mg/dL    Comment: Performed at [facility], [address], [place], [place] [address]  Brain natriuretic peptide     Status: None   Collection Time: 03/27/20  5:18 PM  Result Value Ref Range   B Natriuretic Peptide 20.3 0.0 - 100.0 pg/mL    Comment: Performed at [facility], [address] [place], [place] [address]  D-dimer, quantitative (not at [facility])     Status: Abnormal   Collection Time: 03/27/20  5:18 PM  Result Value Ref Range   D-Dimer, Quant 0.92 (H) 0.00 - 0.50 ug/mL-FEU    Comment: (NOTE) At the manufacturer cut-off value of 0.5 g/mL FEU, this assay has a negative predictive value of 95-100%.This assay is intended for use in conjunction with a clinical pretest probability (PTP) assessment model to exclude pulmonary embolism (PE) and deep venous thrombosis (DVT) in outpatients suspected of PE or DVT. Results should be correlated with clinical presentation. Performed at [facility], [address] [place], [place] [address]   Ferritin     Status: None   Collection Time: 03/27/20  5:18 PM  Result Value Ref Range   Ferritin 87 11 - 307 ng/mL    Comment: Performed at [facility], [address] [place], [place] [address]  Fibrinogen     Status: None   Collection Time: 03/27/20  5:18 PM  Result Value Ref Range   Fibrinogen 469 210 - 475 mg/dL    Comment: Performed at [facility], [address], [place], [place] [address]  Lactate dehydrogenase     Status: None   Collection Time: 03/27/20  5:18 PM  Result Value Ref Range   LDH 188 98 - 192 U/L    Comment: Performed at [facility], [address], [place], [place] [address]  Procalcitonin     Status: None   Collection Time: 03/27/20  5:18 PM  Result Value Ref Range   Procalcitonin 0.45 ng/mL    Comment:        Interpretation: PCT (Procalcitonin) <= 0.5 ng/mL: Systemic infection (sepsis) is not likely. Local bacterial infection is possible. (NOTE)       Sepsis PCT  Algorithm           Lower Respiratory Tract  Infection PCT Algorithm    ----------------------------     ----------------------------         PCT < 0.25 ng/mL                PCT < 0.10 ng/mL          Strongly encourage             Strongly discourage   discontinuation of antibiotics    initiation of antibiotics    ----------------------------     -----------------------------       PCT 0.25 - 0.50 ng/mL            PCT 0.10 - 0.25 ng/mL               OR       >80% decrease in PCT            Discourage initiation of                                            antibiotics      Encourage discontinuation           of antibiotics    ----------------------------     -----------------------------         PCT >= 0.50 ng/mL              PCT 0.26 - 0.50 ng/mL               AND        <80% decrease in PCT             Encourage initiation of                                             antibiotics       Encourage continuation           of antibiotics    ----------------------------     -----------------------------        PCT >= 0.50 ng/mL                  PCT > 0.50 ng/mL               AND         increase in PCT                  Strongly encourage                                      initiation of antibiotics    Strongly encourage escalation           of antibiotics                                     -----------------------------                                           PCT <= 0.25 ng/mL  OR                                        > 80% decrease in PCT                                      Discontinue / Do not initiate                                             antibiotics  Performed at [facility], [address] [place], [place] [address]   Hepatic function panel     Status: Abnormal   Collection Time: 03/27/20  5:18 PM  Result Value Ref Range   Total Protein 6.2 (L) 6.5 - 8.1 g/dL   Albumin  2.6 (L) 3.5 - 5.0 g/dL   AST 49 (H) 15 - 41 U/L   ALT 40 0 - 44 U/L   Alkaline Phosphatase 96 38 - 126 U/L   Total Bilirubin 0.6 0.3 - 1.2 mg/dL   Bilirubin, Direct 0.2 0.0 - 0.2 mg/dL   Indirect Bilirubin 0.4 0.3 - 0.9 mg/dL    Comment: Performed at [facility], [address] [place], [place] [address]  Glucose, capillary     Status: None   Collection Time: 03/27/20  6:00 PM  Result Value Ref Range   Glucose-Capillary 77 70 - 99 mg/dL    Comment: Glucose reference range applies only to samples taken after fasting for at least 8 hours.  Glucose, capillary     Status: Abnormal   Collection Time: 03/27/20  9:58 PM  Result Value Ref Range   Glucose-Capillary 149 (H) 70 - 99 mg/dL    Comment: Glucose reference range applies only to samples taken after fasting for at least 8 hours.  CBC with Differential/Platelet     Status: Abnormal   Collection Time: 03/28/20  7:49 AM  Result Value Ref Range   WBC 3.5 (L) 4.0 - 10.5 K/uL   RBC 4.56 3.87 - 5.11 MIL/uL   Hemoglobin 13.8 12.0 - 15.0 g/dL   HCT 19.140.0 47.836.0 - 29.546.0 %   MCV 87.7 80.0 - 100.0 fL   MCH 30.3 26.0 - 34.0 pg   MCHC 34.5 30.0 - 36.0 g/dL   RDW 62.113.0 30.811.5 - 65.715.5 %   Platelets 127 (L) 150 - 400 K/uL   nRBC 0.0 0.0 - 0.2 %   Neutrophils Relative % 58 %   Neutro Abs 2.0 1.7 - 7.7 K/uL   Lymphocytes Relative 33 %   Lymphs Abs 1.1 0.7 - 4.0 K/uL   Monocytes Relative 8 %   Monocytes Absolute 0.3 0.1 - 1.0 K/uL   Eosinophils Relative 0 %   Eosinophils Absolute 0.0 0.0 - 0.5 K/uL   Basophils Relative 0 %   Basophils Absolute 0.0 0.0 - 0.1 K/uL   Immature Granulocytes 1 %   Abs Immature Granulocytes 0.04 0.00 - 0.07 K/uL    Comment: Performed at [facility], [address] [place], [place] [address]  Comprehensive metabolic panel     Status: Abnormal   Collection Time: 03/28/20  7:49 AM  Result Value Ref Range   Sodium 134 (  L) 135 - 145 mmol/L   Potassium 3.7 3.5 - 5.1 mmol/L   Chloride 107 98 - 111 mmol/L   CO2  14 (L) 22 - 32 mmol/L   Glucose, Bld 89 70 - 99 mg/dL    Comment: Glucose reference range applies only to samples taken after fasting for at least 8 hours.   BUN 7 6 - 20 mg/dL   Creatinine, Ser 1.09 0.44 - 1.00 mg/dL   Calcium 8.0 (L) 8.9 - 10.3 mg/dL   Total Protein 6.1 (L) 6.5 - 8.1 g/dL   Albumin 2.4 (L) 3.5 - 5.0 g/dL   AST 40 15 - 41 U/L   ALT 37 0 - 44 U/L   Alkaline Phosphatase 105 38 - 126 U/L   Total Bilirubin 0.5 0.3 - 1.2 mg/dL   GFR, Estimated >32 >35 mL/min    Comment: (NOTE) Calculated using the CKD-EPI Creatinine Equation (2021)    Anion gap 13 5 - 15    Comment: Performed at [facility], [address] [place], [place] [address]  C-reactive protein     Status: Abnormal   Collection Time: 03/28/20  7:49 AM  Result Value Ref Range   CRP 4.1 (H) <1.0 mg/dL    Comment: Performed at [facility], [address] [place], [place] [address]  D-dimer, quantitative (not at [facility])     Status: Abnormal   Collection Time: 03/28/20  7:49 AM  Result Value Ref Range   D-Dimer, Quant 0.55 (H) 0.00 - 0.50 ug/mL-FEU    Comment: (NOTE) At the manufacturer cut-off value of 0.5 g/mL FEU, this assay has a negative predictive value of 95-100%.This assay is intended for use in conjunction with a clinical pretest probability (PTP) assessment model to exclude pulmonary embolism (PE) and deep venous thrombosis (DVT) in outpatients suspected of PE or DVT. Results should be correlated with clinical presentation. Performed at [facility], [address] [place], [place] [address]   Magnesium     Status: None   Collection Time: 03/28/20  7:49 AM  Result Value Ref Range   Magnesium 2.0 1.7 - 2.4 mg/dL    Comment: Performed at [facility], [address] [place], [place] [address]  Brain natriuretic peptide     Status: None   Collection Time: 03/28/20  7:49 AM  Result Value Ref Range   B Natriuretic Peptide 24.2 0.0 - 100.0 pg/mL    Comment: Performed at  [facility], [address], [place], [place] [address]  Procalcitonin - Baseline     Status: None   Collection Time: 03/28/20  7:49 AM  Result Value Ref Range   Procalcitonin 0.34 ng/mL    Comment:        Interpretation: PCT (Procalcitonin) <= 0.5 ng/mL: Systemic infection (sepsis) is not likely. Local bacterial infection is possible. (NOTE)       Sepsis PCT Algorithm           Lower Respiratory Tract                                      Infection PCT Algorithm    ----------------------------     ----------------------------         PCT < 0.25 ng/mL                PCT < 0.10 ng/mL  Strongly encourage             Strongly discourage   discontinuation of antibiotics    initiation of antibiotics    ----------------------------     -----------------------------       PCT 0.25 - 0.50 ng/mL            PCT 0.10 - 0.25 ng/mL               OR       >80% decrease in PCT            Discourage initiation of                                            antibiotics      Encourage discontinuation           of antibiotics    ----------------------------     -----------------------------         PCT >= 0.50 ng/mL              PCT 0.26 - 0.50 ng/mL               AND        <80% decrease in PCT             Encourage initiation of                                             antibiotics       Encourage continuation           of antibiotics    ----------------------------     -----------------------------        PCT >= 0.50 ng/mL                  PCT > 0.50 ng/mL               AND         increase in PCT                  Strongly encourage                                      initiation of antibiotics    Strongly encourage escalation           of antibiotics                                     -----------------------------                                           PCT <= 0.25 ng/mL                                                 OR                                        >   80%  decrease in PCT                                      Discontinue / Do not initiate                                             antibiotics  Performed at [facility], [address], [place], [place] [address]     I have reviewed the patient's current medications.  ASSESSMENT: Active Problems:   Multigravida of advanced maternal age in third trimester   Gestational diabetes mellitus (GDM), antepartum   BMI 32.0-32.9,adult   Language barrier   COVID-19 affecting pregnancy in third trimester   Gestational thrombocytopenia (HCC)   PLAN:  COVID 19 - cont IV solumedrol - cont remdesivir - O2 prn if sats decrease overnight  T2DM - SSI - monitor CBGs - diabetic diet  Routine prenatal care - Daily NST  Thrombocytopenia - will monitor as pregnancy progresses   Continue routine antenatal care. If patient feeling better tomorrow, possible discharge.  Engineer, structural used for interview  Appreciate hospitalist care, recommendations.    Baldemar Lenis, MD, [facility] Attending [facility] [employer] (Faculty Practice)  03/28/2020 11:28 AM

## 2020-03-29 LAB — COMPREHENSIVE METABOLIC PANEL
ALT: 36 U/L (ref 0–44)
AST: 40 U/L (ref 15–41)
Albumin: 2.4 g/dL — ABNORMAL LOW (ref 3.5–5.0)
Alkaline Phosphatase: 108 U/L (ref 38–126)
Anion gap: 12 (ref 5–15)
BUN: 10 mg/dL (ref 6–20)
CO2: 16 mmol/L — ABNORMAL LOW (ref 22–32)
Calcium: 8.1 mg/dL — ABNORMAL LOW (ref 8.9–10.3)
Chloride: 110 mmol/L (ref 98–111)
Creatinine, Ser: 0.56 mg/dL (ref 0.44–1.00)
GFR, Estimated: >60 mL/min (ref >60)
Glucose, Bld: 84 mg/dL (ref 70–99)
Potassium: 3.7 mmol/L (ref 3.5–5.1)
Sodium: 138 mmol/L (ref 135–145)
Total Bilirubin: 0.3 mg/dL (ref 0.3–1.2)
Total Protein: 6.1 g/dL — ABNORMAL LOW (ref 6.5–8.1)

## 2020-03-29 LAB — CBC WITH DIFFERENTIAL/PLATELET
Abs Immature Granulocytes: 0.05 10*3/uL (ref 0.00–0.07)
Basophils Absolute: 0 10*3/uL (ref 0.0–0.1)
Basophils Relative: 0 %
Eosinophils Absolute: 0 10*3/uL (ref 0.0–0.5)
Eosinophils Relative: 0 %
HCT: 41 % (ref 36.0–46.0)
Hemoglobin: 13.6 g/dL (ref 12.0–15.0)
Immature Granulocytes: 1 %
Lymphocytes Relative: 35 %
Lymphs Abs: 1.4 10*3/uL (ref 0.7–4.0)
MCH: 29.4 pg (ref 26.0–34.0)
MCHC: 33.2 g/dL (ref 30.0–36.0)
MCV: 88.7 fL (ref 80.0–100.0)
Monocytes Absolute: 0.3 10*3/uL (ref 0.1–1.0)
Monocytes Relative: 7 %
Neutro Abs: 2.2 10*3/uL (ref 1.7–7.7)
Neutrophils Relative %: 57 %
Platelets: 144 10*3/uL — ABNORMAL LOW (ref 150–400)
RBC: 4.62 MIL/uL (ref 3.87–5.11)
RDW: 12.9 % (ref 11.5–15.5)
WBC: 3.9 10*3/uL — ABNORMAL LOW (ref 4.0–10.5)
nRBC: 0 % (ref 0.0–0.2)

## 2020-03-29 LAB — D-DIMER, QUANTITATIVE: D-Dimer, Quant: 0.48 ug/mL-FEU (ref 0.00–0.50)

## 2020-03-29 LAB — BRAIN NATRIURETIC PEPTIDE: B Natriuretic Peptide: 46.6 pg/mL (ref 0.0–100.0)

## 2020-03-29 LAB — GLUCOSE, CAPILLARY
Glucose-Capillary: 85 mg/dL (ref 70–99)
Glucose-Capillary: 110 mg/dL — ABNORMAL HIGH (ref 70–99)
Glucose-Capillary: 104 mg/dL — ABNORMAL HIGH (ref 70–99)
Glucose-Capillary: 254 mg/dL — ABNORMAL HIGH (ref 70–99)

## 2020-03-29 LAB — C-REACTIVE PROTEIN: CRP: 1.9 mg/dL — ABNORMAL HIGH (ref <1.0)

## 2020-03-29 LAB — PROCALCITONIN: Procalcitonin: 0.25 ng/mL

## 2020-03-29 LAB — MAGNESIUM: Magnesium: 2 mg/dL (ref 1.7–2.4)

## 2020-03-29 NOTE — Progress Notes (Signed)
Ordered lunch and snack, by Orlan Leavens Spanish Medical interpreter.

## 2020-03-29 NOTE — Progress Notes (Signed)
FACULTY PRACTICE ANTEPARTUM PROGRESS NOTE  Aimee Benjamin is a 40 y.o. [id number] at [redacted]w[redacted]d who is admitted for COVID infection in pregnancy.  Estimated Date of Delivery: 05/25/20 Fetal presentation is unsure.  Length of Stay:  2 Days. Admitted 03/26/2020  Subjective: Patient reports feeling better. She is still short of breath but it is improved from yesterday. Able to ambulate. Patient reports normal fetal movement.  She denies uterine contractions, denies bleeding and leaking of fluid per vagina.  Vitals:  Blood pressure 106/64, pulse 68, temperature 98.6 F (37 C), temperature source Oral, resp. rate 17, height 5' (1.524 m), weight 75.8 kg, last menstrual period 08/01/2019, SpO2 98 %, unknown if currently breastfeeding. Physical Examination: CONSTITUTIONAL: Well-developed, well-nourished female in no acute distress. Appears less fatigued than yesterday HENT:  Normocephalic, atraumatic, External right and left ear normal. Oropharynx is clear and moist EYES: Conjunctivae and EOM are normal. Pupils are equal, round, and reactive to light. No scleral icterus.  NECK: Normal range of motion, supple, no masses. SKIN: Skin is warm and dry. No rash noted. Not diaphoretic. No erythema. No pallor. NEUROLGIC: Alert and oriented to person, place, and time. Normal reflexes, muscle tone coordination. No cranial nerve deficit noted. PSYCHIATRIC: Normal mood and affect. Normal behavior. Normal judgment and thought content. CARDIOVASCULAR: Normal heart rate noted RESPIRATORY: Effort normal, no problems with respiration noted MUSCULOSKELETAL: Normal range of motion. No edema and no tenderness. ABDOMEN: Soft, nontender, nondistended, gravid. CERVIX: deferred  Fetal monitoring: FHR: 120 bpm, Variability: moderate, Accelerations: Present, Decelerations: Absent  Uterine activity: no contractions per hour  Results for orders placed or performed during the hospital encounter of 03/26/20 (from the past 48 hour(s))   Type and screen [facility]     Status: None   Collection Time: 03/27/20  5:18 PM  Result Value Ref Range   ABO/RH(D) O POS    Antibody Screen NEG    Sample Expiration      03/30/2020,2359 Performed at [facility], [address], [place], [place] [address]   C-reactive protein     Status: Abnormal   Collection Time: 03/27/20  5:18 PM  Result Value Ref Range   CRP 5.0 (H) <1.0 mg/dL    Comment: Performed at [facility], [address], [place], [place] [address]  Brain natriuretic peptide     Status: None   Collection Time: 03/27/20  5:18 PM  Result Value Ref Range   B Natriuretic Peptide 20.3 0.0 - 100.0 pg/mL    Comment: Performed at [facility], [address], [place], [place] [address]  D-dimer, quantitative (not at [facility])     Status: Abnormal   Collection Time: 03/27/20  5:18 PM  Result Value Ref Range   D-Dimer, Quant 0.92 (H) 0.00 - 0.50 ug/mL-FEU    Comment: (NOTE) At the manufacturer cut-off value of 0.5 g/mL FEU, this assay has a negative predictive value of 95-100%.This assay is intended for use in conjunction with a clinical pretest probability (PTP) assessment model to exclude pulmonary embolism (PE) and deep venous thrombosis (DVT) in outpatients suspected of PE or DVT. Results should be correlated with clinical presentation. Performed at [facility], [address] [place], [place] [address]   Ferritin     Status: None   Collection Time: 03/27/20  5:18 PM  Result Value Ref Range   Ferritin 87 11 - 307 ng/mL    Comment: Performed at [facility], [address], [place], [place] [address]  Fibrinogen     Status: None   Collection Time: 03/27/20  5:18 PM  Result Value Ref Range   Fibrinogen 469 210 - 475 mg/dL    Comment: Performed at [facility], [address] [place], [place] [address]  Lactate dehydrogenase     Status: None   Collection Time: 03/27/20  5:18 PM  Result Value Ref Range   LDH  188 98 - 192 U/L    Comment: Performed at [facility], [address], [place], [place] [address]  Procalcitonin     Status: None   Collection Time: 03/27/20  5:18 PM  Result Value Ref Range   Procalcitonin 0.45 ng/mL    Comment:        Interpretation: PCT (Procalcitonin) <= 0.5 ng/mL: Systemic infection (sepsis) is not likely. Local bacterial infection is possible. (NOTE)       Sepsis PCT Algorithm           Lower Respiratory Tract                                      Infection PCT Algorithm    ----------------------------     ----------------------------         PCT < 0.25 ng/mL                PCT < 0.10 ng/mL          Strongly encourage             Strongly discourage   discontinuation of antibiotics    initiation of antibiotics    ----------------------------     -----------------------------       PCT 0.25 - 0.50 ng/mL            PCT 0.10 - 0.25 ng/mL               OR       >80% decrease in PCT            Discourage initiation of                                            antibiotics      Encourage discontinuation           of antibiotics    ----------------------------     -----------------------------         PCT >= 0.50 ng/mL              PCT 0.26 - 0.50 ng/mL               AND        <80% decrease in PCT             Encourage initiation of                                             antibiotics       Encourage continuation           of antibiotics    ----------------------------     -----------------------------        PCT >= 0.50 ng/mL  PCT > 0.50 ng/mL               AND         increase in PCT                  Strongly encourage                                      initiation of antibiotics    Strongly encourage escalation           of antibiotics                                     -----------------------------                                           PCT <= 0.25 ng/mL                                                 OR                                         > 80% decrease in PCT                                      Discontinue / Do not initiate                                             antibiotics  Performed at [facility], [address], [place], [place] [address]   Hepatic function panel     Status: Abnormal   Collection Time: 03/27/20  5:18 PM  Result Value Ref Range   Total Protein 6.2 (L) 6.5 - 8.1 g/dL   Albumin 2.6 (L) 3.5 - 5.0 g/dL   AST 49 (H) 15 - 41 U/L   ALT 40 0 - 44 U/L   Alkaline Phosphatase 96 38 - 126 U/L   Total Bilirubin 0.6 0.3 - 1.2 mg/dL   Bilirubin, Direct 0.2 0.0 - 0.2 mg/dL   Indirect Bilirubin 0.4 0.3 - 0.9 mg/dL    Comment: Performed at [facility], [address], [place], [place] [address]  Glucose, capillary     Status: None   Collection Time: 03/27/20  6:00 PM  Result Value Ref Range   Glucose-Capillary 77 70 - 99 mg/dL    Comment: Glucose reference range applies only to samples taken after fasting for at least 8 hours.  Glucose, capillary     Status: Abnormal   Collection Time: 03/27/20  9:58 PM  Result Value Ref Range   Glucose-Capillary 149 (H) 70 - 99 mg/dL    Comment: Glucose reference range applies only to samples taken after fasting for at least 8 hours.  CBC with  Differential/Platelet     Status: Abnormal   Collection Time: 03/28/20  7:49 AM  Result Value Ref Range   WBC 3.5 (L) 4.0 - 10.5 K/uL   RBC 4.56 3.87 - 5.11 MIL/uL   Hemoglobin 13.8 12.0 - 15.0 g/dL   HCT 86.5 78.4 - 69.6 %   MCV 87.7 80.0 - 100.0 fL   MCH 30.3 26.0 - 34.0 pg   MCHC 34.5 30.0 - 36.0 g/dL   RDW 29.5 28.4 - 13.2 %   Platelets 127 (L) 150 - 400 K/uL   nRBC 0.0 0.0 - 0.2 %   Neutrophils Relative % 58 %   Neutro Abs 2.0 1.7 - 7.7 K/uL   Lymphocytes Relative 33 %   Lymphs Abs 1.1 0.7 - 4.0 K/uL   Monocytes Relative 8 %   Monocytes Absolute 0.3 0.1 - 1.0 K/uL   Eosinophils Relative 0 %   Eosinophils Absolute 0.0 0.0 - 0.5 K/uL   Basophils Relative 0 %   Basophils Absolute  0.0 0.0 - 0.1 K/uL   Immature Granulocytes 1 %   Abs Immature Granulocytes 0.04 0.00 - 0.07 K/uL    Comment: Performed at [facility], [address], [place], [place] [address]  Comprehensive metabolic panel     Status: Abnormal   Collection Time: 03/28/20  7:49 AM  Result Value Ref Range   Sodium 134 (L) 135 - 145 mmol/L   Potassium 3.7 3.5 - 5.1 mmol/L   Chloride 107 98 - 111 mmol/L   CO2 14 (L) 22 - 32 mmol/L   Glucose, Bld 89 70 - 99 mg/dL    Comment: Glucose reference range applies only to samples taken after fasting for at least 8 hours.   BUN 7 6 - 20 mg/dL   Creatinine, Ser 2.72 0.44 - 1.00 mg/dL   Calcium 8.0 (L) 8.9 - 10.3 mg/dL   Total Protein 6.1 (L) 6.5 - 8.1 g/dL   Albumin 2.4 (L) 3.5 - 5.0 g/dL   AST 40 15 - 41 U/L   ALT 37 0 - 44 U/L   Alkaline Phosphatase 105 38 - 126 U/L   Total Bilirubin 0.5 0.3 - 1.2 mg/dL   GFR, Estimated >53 >66 mL/min    Comment: (NOTE) Calculated using the CKD-EPI Creatinine Equation (2021)    Anion gap 13 5 - 15    Comment: Performed at [facility], [address] [place], [place] [address]  C-reactive protein     Status: Abnormal   Collection Time: 03/28/20  7:49 AM  Result Value Ref Range   CRP 4.1 (H) <1.0 mg/dL    Comment: Performed at [facility], [address] [place], [place] [address]  D-dimer, quantitative (not at [facility])     Status: Abnormal   Collection Time: 03/28/20  7:49 AM  Result Value Ref Range   D-Dimer, Quant 0.55 (H) 0.00 - 0.50 ug/mL-FEU    Comment: (NOTE) At the manufacturer cut-off value of 0.5 g/mL FEU, this assay has a negative predictive value of 95-100%.This assay is intended for use in conjunction with a clinical pretest probability (PTP) assessment model to exclude pulmonary embolism (PE) and deep venous thrombosis (DVT) in outpatients suspected of PE or DVT. Results should be correlated with clinical presentation. Performed at [facility], [address],  [place], [place] [address]   Magnesium     Status: None   Collection Time: 03/28/20  7:49 AM  Result Value Ref Range  Magnesium 2.0 1.7 - 2.4 mg/dL    Comment: Performed at [facility], [address], [place], [place] [address]  Brain natriuretic peptide     Status: None   Collection Time: 03/28/20  7:49 AM  Result Value Ref Range   B Natriuretic Peptide 24.2 0.0 - 100.0 pg/mL    Comment: Performed at [facility], [address], [place], [place] [address]  Procalcitonin - Baseline     Status: None   Collection Time: 03/28/20  7:49 AM  Result Value Ref Range   Procalcitonin 0.34 ng/mL    Comment:        Interpretation: PCT (Procalcitonin) <= 0.5 ng/mL: Systemic infection (sepsis) is not likely. Local bacterial infection is possible. (NOTE)       Sepsis PCT Algorithm           Lower Respiratory Tract                                      Infection PCT Algorithm    ----------------------------     ----------------------------         PCT < 0.25 ng/mL                PCT < 0.10 ng/mL          Strongly encourage             Strongly discourage   discontinuation of antibiotics    initiation of antibiotics    ----------------------------     -----------------------------       PCT 0.25 - 0.50 ng/mL            PCT 0.10 - 0.25 ng/mL               OR       >80% decrease in PCT            Discourage initiation of                                            antibiotics      Encourage discontinuation           of antibiotics    ----------------------------     -----------------------------         PCT >= 0.50 ng/mL              PCT 0.26 - 0.50 ng/mL               AND        <80% decrease in PCT             Encourage initiation of                                             antibiotics       Encourage continuation           of antibiotics    ----------------------------     -----------------------------        PCT >= 0.50 ng/mL                  PCT > 0.50 ng/mL  AND         increase in PCT                  Strongly encourage                                      initiation of antibiotics    Strongly encourage escalation           of antibiotics                                     -----------------------------                                           PCT <= 0.25 ng/mL                                                 OR                                        > 80% decrease in PCT                                      Discontinue / Do not initiate                                             antibiotics  Performed at [facility], [address] [place], [place] [address]   Glucose, capillary     Status: None   Collection Time: 03/28/20 11:32 AM  Result Value Ref Range   Glucose-Capillary 99 70 - 99 mg/dL    Comment: Glucose reference range applies only to samples taken after fasting for at least 8 hours.  Glucose, capillary     Status: Abnormal   Collection Time: 03/28/20  3:59 PM  Result Value Ref Range   Glucose-Capillary 111 (H) 70 - 99 mg/dL    Comment: Glucose reference range applies only to samples taken after fasting for at least 8 hours.  Glucose, capillary     Status: Abnormal   Collection Time: 03/28/20  9:05 PM  Result Value Ref Range   Glucose-Capillary 200 (H) 70 - 99 mg/dL    Comment: Glucose reference range applies only to samples taken after fasting for at least 8 hours.  Glucose, capillary     Status: None   Collection Time: 03/29/20  7:27 AM  Result Value Ref Range   Glucose-Capillary 85 70 - 99 mg/dL    Comment: Glucose reference range applies only to samples taken after fasting for at least 8 hours.  CBC with Differential/Platelet     Status: Abnormal   Collection Time: 03/29/20  7:36 AM  Result Value Ref Range   WBC 3.9 (L) 4.0 - 10.5 K/uL   RBC 4.62  3.87 - 5.11 MIL/uL   Hemoglobin 13.6 12.0 - 15.0 g/dL   HCT 19.3 79.0 - 24.0 %   MCV 88.7 80.0 - 100.0 fL   MCH 29.4 26.0 - 34.0 pg   MCHC 33.2 30.0 - 36.0  g/dL   RDW 97.3 53.2 - 99.2 %   Platelets 144 (L) 150 - 400 K/uL   nRBC 0.0 0.0 - 0.2 %   Neutrophils Relative % 57 %   Neutro Abs 2.2 1.7 - 7.7 K/uL   Lymphocytes Relative 35 %   Lymphs Abs 1.4 0.7 - 4.0 K/uL   Monocytes Relative 7 %   Monocytes Absolute 0.3 0.1 - 1.0 K/uL   Eosinophils Relative 0 %   Eosinophils Absolute 0.0 0.0 - 0.5 K/uL   Basophils Relative 0 %   Basophils Absolute 0.0 0.0 - 0.1 K/uL   Immature Granulocytes 1 %   Abs Immature Granulocytes 0.05 0.00 - 0.07 K/uL    Comment: Performed at [facility], [address] [place], [place] [address]  Comprehensive metabolic panel     Status: Abnormal   Collection Time: 03/29/20  7:36 AM  Result Value Ref Range   Sodium 138 135 - 145 mmol/L   Potassium 3.7 3.5 - 5.1 mmol/L   Chloride 110 98 - 111 mmol/L   CO2 16 (L) 22 - 32 mmol/L   Glucose, Bld 84 70 - 99 mg/dL    Comment: Glucose reference range applies only to samples taken after fasting for at least 8 hours.   BUN 10 6 - 20 mg/dL   Creatinine, Ser 4.19 0.44 - 1.00 mg/dL   Calcium 8.1 (L) 8.9 - 10.3 mg/dL   Total Protein 6.1 (L) 6.5 - 8.1 g/dL   Albumin 2.4 (L) 3.5 - 5.0 g/dL   AST 40 15 - 41 U/L   ALT 36 0 - 44 U/L   Alkaline Phosphatase 108 38 - 126 U/L   Total Bilirubin 0.3 0.3 - 1.2 mg/dL   GFR, Estimated >62 >22 mL/min    Comment: (NOTE) Calculated using the CKD-EPI Creatinine Equation (2021)    Anion gap 12 5 - 15    Comment: Performed at [facility], [address] [place], [place] [address]  C-reactive protein     Status: Abnormal   Collection Time: 03/29/20  7:36 AM  Result Value Ref Range   CRP 1.9 (H) <1.0 mg/dL    Comment: Performed at [facility], [address], [place], [place] [address]  D-dimer, quantitative (not at [facility])     Status: None   Collection Time: 03/29/20  7:36 AM  Result Value Ref Range   D-Dimer, Quant 0.48 0.00 - 0.50 ug/mL-FEU    Comment: (NOTE) At the manufacturer cut-off value of 0.5 g/mL FEU,  this assay has a negative predictive value of 95-100%.This assay is intended for use in conjunction with a clinical pretest probability (PTP) assessment model to exclude pulmonary embolism (PE) and deep venous thrombosis (DVT) in outpatients suspected of PE or DVT. Results should be correlated with clinical presentation. Performed at [facility], [address] [place], [place] [address]   Magnesium     Status: None   Collection Time: 03/29/20  7:36 AM  Result Value Ref Range   Magnesium 2.0 1.7 - 2.4 mg/dL    Comment: Performed at [facility], [address], [place], [place] [address]  Brain natriuretic peptide     Status: None  Collection Time: 03/29/20  7:36 AM  Result Value Ref Range   B Natriuretic Peptide 46.6 0.0 - 100.0 pg/mL    Comment: Performed at [facility], [address] [place], [place] [address]  Procalcitonin     Status: None   Collection Time: 03/29/20  7:36 AM  Result Value Ref Range   Procalcitonin 0.25 ng/mL    Comment:        Interpretation: PCT (Procalcitonin) <= 0.5 ng/mL: Systemic infection (sepsis) is not likely. Local bacterial infection is possible. (NOTE)       Sepsis PCT Algorithm           Lower Respiratory Tract                                      Infection PCT Algorithm    ----------------------------     ----------------------------         PCT < 0.25 ng/mL                PCT < 0.10 ng/mL          Strongly encourage             Strongly discourage   discontinuation of antibiotics    initiation of antibiotics    ----------------------------     -----------------------------       PCT 0.25 - 0.50 ng/mL            PCT 0.10 - 0.25 ng/mL               OR       >80% decrease in PCT            Discourage initiation of                                            antibiotics      Encourage discontinuation           of antibiotics    ----------------------------     -----------------------------         PCT >= 0.50 ng/mL               PCT 0.26 - 0.50 ng/mL               AND        <80% decrease in PCT             Encourage initiation of                                             antibiotics       Encourage continuation           of antibiotics    ----------------------------     -----------------------------        PCT >= 0.50 ng/mL                  PCT > 0.50 ng/mL               AND         increase in PCT                  Strongly  encourage                                      initiation of antibiotics    Strongly encourage escalation           of antibiotics                                     -----------------------------                                           PCT <= 0.25 ng/mL                                                 OR                                        > 80% decrease in PCT                                      Discontinue / Do not initiate                                             antibiotics  Performed at [facility], [address], [place], [place] [address]   Glucose, capillary     Status: Abnormal   Collection Time: 03/29/20  9:26 AM  Result Value Ref Range   Glucose-Capillary 110 (H) 70 - 99 mg/dL    Comment: Glucose reference range applies only to samples taken after fasting for at least 8 hours.    I have reviewed the patient's current medications.  ASSESSMENT: Active Problems:   Multigravida of advanced maternal age in third trimester   Gestational diabetes mellitus (GDM), antepartum   BMI 32.0-32.9,adult   Language barrier   COVID-19 affecting pregnancy in third trimester   Gestational thrombocytopenia (HCC)   PLAN: COVID 19 - cont IV solumedrol - cont remdesivir - O2 prn if sats decrease overnight - per TRH, plan to finish steroids and remdesivir today then home tomorrow as long as she remains stable  T2DM - SSI - monitor CBGs - diabetic diet  Routine prenatal care - Daily NST  Thrombocytopenia - will monitor as pregnancy  progresses   Continue routine antenatal care.  Engineer, structural used for interview  Appreciate hospitalist care, recommendations.    Baldemar Lenis, MD, [facility] Attending Center for [employer] (Faculty Practice)  03/29/2020 12:26 PM

## 2020-03-29 NOTE — Progress Notes (Signed)
PROGRESS NOTE                                                                                                                                                                                                             Patient Demographics:    Aimee Benjamin, is a 40 y.o. female, DOB - 07/27/1980, ZOX:[id number]  Outpatient Primary MD for the patient is No primary care provider on file.    LOS - 2  Admit date - 03/26/2020    Chief Complaint  Patient presents with  . Chest Pain  . Covid Positive       Brief Narrative (HPI from H&P) - Aimee Benjamin is a 40 y.o. [id number] (Patient's last menstrual period was 08/01/2019.), with the above CC. PMHx is significant for AMA >40, BMI 33, GDM2, [facility] with symptoms of cough and mild exertional shortness of breath, she was diagnosed with COVID-19 pneumonia and [facility].  [facility].   Subjective:   Patient in bed, appears comfortable, denies any headache, no fever, no chest pain or pressure, mild cough but no shortness of breath , no abdominal pain. No focal weakness.    Assessment  & Plan :     1. Acute Covid 19 Viral Pneumonitis - she is unfortunately not vaccinated and seems to have incurred mild to moderate parenchymal lung injury, on IV steroids and Remdesivir and doing fairly well.  Continue to monitor closely.  If continues to improve will give 1 more dose of steroids and remdesivir on 03/30/2020 and then can be discharged home.  Encouraged the patient to sit up in chair in the daytime use I-S and flutter valve for pulmonary toiletry and then prone in bed when at night.  Will advance activity and titrate down oxygen as possible.   SpO2: 98 %  Recent Labs  Lab 03/27/20 0112 03/27/20 0114 03/27/20 1718 03/28/20 0749 03/29/20 0736  WBC 5.2  --   --  3.5* 3.9*  HGB 13.6  --   --  13.8 13.6  HCT  39.9  --   --  40.0 41.0  PLT 112*  --   --  127* 144*  CRP  --   --  5.0* 4.1* 1.9*  BNP  --   --  20.3  24.2 46.6  DDIMER  --   --  0.92* 0.55* 0.48  PROCALCITON  --   --  0.45 0.34 0.25  AST  --   --  49* 40 40  ALT  --   --  40 37 36  ALKPHOS  --   --  96 105 108  BILITOT  --   --  0.6 0.5 0.3  ALBUMIN  --   --  2.6* 2.4* 2.4*  SARSCOV2NAA  --  POSITIVE*  --   --   --    Lab Results  Component Value Date   HGBA1C 5.2 02/19/2020   CBG (last 3)  Recent Labs    03/28/20 2105 03/29/20 0727 03/29/20 0926  GLUCAP 200* 85 110*     2.  [redacted] weeks pregnant, type 2 diabetes.  Defer to primary team which is OB.         Condition - Fair  Family Communication  :  None bedside  Code Status :  Full  Consults  :  [facility] consulting for primary team OB.  Procedures  :    PUD Prophylaxis : None   DVT Prophylaxis  :  Lovenox   Lab Results  Component Value Date   PLT 144 (L) 03/29/2020    Diet :  Diet Order            Diet gestational carb mod Fluid consistency: Thin; Room service appropriate? Yes  Diet effective now                  Inpatient Medications  Scheduled Meds: . albuterol  2 puff Inhalation Q6H  . vitamin C  500 mg Oral Daily  . cholecalciferol  1,000 Units Oral Daily  . dexamethasone (DECADRON) injection  6 mg Intravenous Q24H  . enoxaparin (LOVENOX) injection  40 mg Subcutaneous Q24H  . fluticasone  1 spray Each Nare BID  . insulin aspart  0-12 Units Subcutaneous TID PC  . prenatal multivitamin  1 tablet Oral Q1200  . sodium chloride  1 spray Each Nare TID  . sodium chloride flush  3 mL Intravenous Q12H  . zinc sulfate  220 mg Oral Daily   Continuous Infusions: . sodium chloride Stopped (03/27/20 2035)  . remdesivir 100 mg in NS 100 mL 100 mg (03/29/20 1019)   PRN Meds:.sodium chloride, acetaminophen, calcium carbonate, docusate sodium, [DISCONTINUED] ondansetron **OR** ondansetron (ZOFRAN) IV, sodium chloride flush  Antibiotics  :     Anti-infectives (From admission, onward)   Start     Dose/Rate Route Frequency Ordered Stop   03/28/20 1000  remdesivir 100 mg in sodium chloride 0.9 % 100 mL IVPB       "Followed by" Linked Group Details   100 mg 200 mL/hr over 30 Minutes Intravenous Daily 03/27/20 1651 04/01/20 0959   03/27/20 1800  remdesivir 200 mg in sodium chloride 0.9% 250 mL IVPB       "Followed by" Linked Group Details   200 mg 580 mL/hr over 30 Minutes Intravenous Once 03/27/20 1651 03/27/20 2034       Time Spent in minutes  30   Susa Raring M.D on 03/29/2020 at 10:25 AM  To page go to [email or web address]   [facility] -  Office  [phone]    See all Orders from today for further details    Objective:   Vitals:   03/29/20 0600 03/29/20 0729 03/29/20 0800 03/29/20 1000  BP:  118/79    Pulse:  88  Resp:  20    Temp:  97.6 F (36.4 C)    TempSrc:  Oral    SpO2: 98% 98% 98% 98%  Weight:      Height:        Wt Readings from Last 3 Encounters:  03/27/20 75.8 kg  03/23/20 75.8 kg  03/03/20 76.2 kg     Intake/Output Summary (Last 24 hours) at 03/29/2020 1025 Last data filed at 03/28/2020 1604 Gross per 24 hour  Intake 100 ml  Output -  Net 100 ml     Physical Exam  Awake Alert, No new F.N deficits, Normal affect [place].AT,PERRAL Supple Neck,No JVD, No cervical lymphadenopathy appriciated.  Symmetrical Chest wall movement, Good air movement bilaterally, CTAB RRR,No Gallops, Rubs or new Murmurs, No Parasternal Heave +ve B.Sounds, pregnant abdomen. No Cyanosis, Clubbing or edema, No new Rash or bruise       Data Review:    CBC Recent Labs  Lab 03/27/20 0112 03/28/20 0749 03/29/20 0736  WBC 5.2 3.5* 3.9*  HGB 13.6 13.8 13.6  HCT 39.9 40.0 41.0  PLT 112* 127* 144*  MCV 88.7 87.7 88.7  MCH 30.2 30.3 29.4  MCHC 34.1 34.5 33.2  RDW 12.9 13.0 12.9  LYMPHSABS  --  1.1 1.4  MONOABS  --  0.3 0.3  EOSABS  --  0.0 0.0  BASOSABS  --  0.0 0.0    Recent Labs  Lab  03/27/20 0112 03/27/20 1718 03/28/20 0749 03/29/20 0736  NA 134*  --  134* 138  K 4.2  --  3.7 3.7  CL 107  --  107 110  CO2 16*  --  14* 16*  GLUCOSE 86  --  89 84  BUN 9  --  7 10  CREATININE 0.61  --  0.53 0.56  CALCIUM 8.2*  --  8.0* 8.1*  AST  --  49* 40 40  ALT  --  40 37 36  ALKPHOS  --  96 105 108  BILITOT  --  0.6 0.5 0.3  ALBUMIN  --  2.6* 2.4* 2.4*  MG  --   --  2.0 2.0  CRP  --  5.0* 4.1* 1.9*  DDIMER  --  0.92* 0.55* 0.48  PROCALCITON  --  0.45 0.34 0.25  BNP  --  20.3 24.2 46.6    ------------------------------------------------------------------------------------------------------------------ No results for input(s): CHOL, HDL, LDLCALC, TRIG, CHOLHDL, LDLDIRECT in the last 72 hours.  Lab Results  Component Value Date   HGBA1C 5.2 02/19/2020   ------------------------------------------------------------------------------------------------------------------ No results for input(s): TSH, T4TOTAL, T3FREE, THYROIDAB in the last 72 hours.  Invalid input(s): FREET3  Cardiac Enzymes No results for input(s): CKMB, TROPONINI, MYOGLOBIN in the last 168 hours.  Invalid input(s): CK ------------------------------------------------------------------------------------------------------------------    Component Value Date/Time   BNP 46.6 03/29/2020 0736    Micro Results Recent Results (from the past 240 hour(s))  Resp Panel by RT-PCR (Flu A&B, Covid) Nasopharyngeal Swab     Status: Abnormal   Collection Time: 03/27/20  1:14 AM   Specimen: Nasopharyngeal Swab; Nasopharyngeal(NP) swabs in vial transport medium  Result Value Ref Range Status   SARS Coronavirus 2 by RT PCR POSITIVE (A) NEGATIVE Final    Comment: RESULT CALLED TO, READ BACK BY AND VERIFIED WITH: RN FLORES MELANIE AT 0237 BY MESSAN H. ON 03/27/2020 (NOTE) SARS-CoV-2 target nucleic acids are DETECTED.  The SARS-CoV-2 RNA is generally detectable in upper respiratory specimens during the acute phase of  infection. Positive results are indicative  of the presence of the identified virus, but do not rule out bacterial infection or co-infection with other pathogens not detected by the test. Clinical correlation with patient history and other diagnostic information is necessary to determine patient infection status. The expected result is Negative.  Fact Sheet for Patients: BloggerCourse.comhttps://www.fda.gov/media/152166/download  Fact Sheet for Healthcare Providers: SeriousBroker.ithttps://www.fda.gov/media/152162/download  This test is not yet approved or cleared by the [place] FDA and  has been authorized for detection and/or diagnosis of SARS-CoV-2 by FDA under an Emergency Use Authorization (EUA).  This EUA will remain in effect (meaning  this test can be used) for the duration of  the COVID-19 declaration under Section 564(b)(1) of the Act, 21 U.S.C. section 360bbb-3(b)(1), unless the authorization is terminated or revoked sooner.     Influenza A by PCR NEGATIVE NEGATIVE Final   Influenza B by PCR NEGATIVE NEGATIVE Final    Comment: (NOTE) The Xpert Xpress SARS-CoV-2/FLU/RSV plus assay is intended as an aid in the diagnosis of influenza from Nasopharyngeal swab specimens and should not be used as a sole basis for treatment. Nasal washings and aspirates are unacceptable for Xpert Xpress SARS-CoV-2/FLU/RSV testing.  Fact Sheet for Patients: BloggerCourse.comhttps://www.fda.gov/media/152166/download  Fact Sheet for Healthcare Providers: SeriousBroker.ithttps://www.fda.gov/media/152162/download  This test is not yet approved or cleared by the [place] FDA and has been authorized for detection and/or diagnosis of SARS-CoV-2 by FDA under an Emergency Use Authorization (EUA). This EUA will remain in effect (meaning this test can be used) for the duration of the COVID-19 declaration under Section 564(b)(1) of the Act, 21 U.S.C. section 360bbb-3(b)(1), unless the authorization is terminated or revoked.  Performed at [facility], [address] [place], [place] [address]     Radiology Reports DG Chest Portable 1 View  Result Date: 03/27/2020 CLINICAL DATA:  Shortness of breath. EXAM: PORTABLE CHEST 1 VIEW COMPARISON:  None. FINDINGS: Lung volumes are low. Bronchovascular crowding related to low lung volumes. There may be underlying peribronchial thickening. Upper normal heart size likely accentuated by AP technique. Normal mediastinal contours. No confluent consolidation. No pleural fluid. No pneumothorax. No acute osseous abnormalities are seen. IMPRESSION: 1. Low lung volumes with bronchovascular crowding. Possible underlying peribronchial thickening. 2. Upper normal heart size likely accentuated by AP technique. Electronically Signed   By: Narda RutherfordMelanie  Sanford M.D.   On: 03/27/2020 01:40   US MFM OB FOLLOW UP  Result Date: 03/14/2020 ----------------------------------------------------------------------  OBSTETRICS REPORT                       (Signed Final 03/14/2020 09:26 am) ---------------------------------------------------------------------- Patient Info  ID #:       [id number]                          D.O.B.:  1981/01/25 (39 yrs)  Name:       Aimee Benjamin               Visit Date: 03/14/2020 09:00 am ---------------------------------------------------------------------- Performed By  Attending:        Noralee Spaceavi Shankar MD        Ref. Address:     [address]  [place], [place]                                                             [address]  Performed By:     Jenel Lucks     [facility]  Referred By:      [facility]  ---------------------------------------------------------------------- Orders  #  Description                           Code        Ordered By  1  [place] MFM OB FOLLOW UP                   [phone]    Noralee Space ----------------------------------------------------------------------  #  Order #                     Accession #                Episode #  1  [id number]                   [id number]                 [id number] ---------------------------------------------------------------------- Indications  [redacted] weeks gestation of pregnancy                Z3A.29  Gestational diabetes in pregnancy,             O24.415  controlled by oral hypoglycemic drugs  (metformin)  Advanced maternal age multigravida 70+,        O91.523  third trimester (39yo)  Poor obstetric history: Hx of preterm          O09.219  delivery, currently pregnant  Poor obstetrical history: 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  Obesity complicating pregnancy, third          O99.213  trimester (BMI 30) ---------------------------------------------------------------------- Fetal Evaluation  Num Of Fetuses:  1  Fetal Heart Rate(bpm):  126  Cardiac Activity:       Observed  Presentation:           Variable  Placenta:               Posterior  Amniotic Fluid  AFI FV:      Within normal limits  AFI Sum(cm)     %Tile       Largest Pocket(cm)  12.58           34          6.61  RUQ(cm)       RLQ(cm)       LUQ(cm)        LLQ(cm)  6.61          1.83          0              4.14 ---------------------------------------------------------------------- Biometry  BPD:      78.4  mm     G. Age:  31w 3d         87  %    CI:        75.83   %    70 - 86                                                          FL/HC:      19.8   %    19.2 - 21.4  HC:      285.4  mm     G. Age:  31w 2d         64  %    HC/AC:      1.05        0.99 - 1.21  AC:      272.3  mm     G. Age:  31w 2d         87  %    FL/BPD:     72.1   %    71 - 87  FL:       56.5  mm     G. Age:  29w 5d          34  %    FL/AC:      20.7   %    20 - 24  LV:        4.5  mm  Est. FW:    1645  gm    3 lb 10 oz      77  % ---------------------------------------------------------------------- OB History  Gravidity:    9         Term:   3  Living:       3 ---------------------------------------------------------------------- Gestational Age  LMP:           32w 2d        Date:  08/01/19                 EDD:   05/07/20  U/S Today:     31w 0d                                        EDD:   05/16/20  Best:          29w 5d     Det. ByMarcella Dubs         EDD:   05/25/20                                      (01/11/20) ---------------------------------------------------------------------- Anatomy  Cranium:               Appears normal         LVOT:                   Appears normal  Cavum:                 Previously seen        Aortic Arch:            Appears normal  Ventricles:            Appears normal         Ductal Arch:            Appears normal  Choroid Plexus:        Previously seen        Diaphragm:              Appears normal  Cerebellum:            Previously seen        Stomach:                Appears normal, left                                                                        sided  Posterior Fossa:       Previously seen        Abdomen:                Appears normal  Nuchal Fold:           Not applicable (>20    Abdominal Wall:         Previously seen                         wks GA)  Face:                  Orbits and profile     Cord Vessels:           Appears normal (3                         previously seen                                vessel cord)  Lips:                  Appears normal         Kidneys:                Appear normal  Palate:  Appears normal         Bladder:                Appears normal  Thoracic:              Appears normal         Spine:                  Limited views of                                                                        cspine  Heart:                 Appears  normal         Upper Extremities:      Limited views                         (4CH, axis, and                         situs)  RVOT:                  Appears normal         Lower Extremities:      Previously seen  Other:  Unable to obtain images of cspine and right ulna/radius and hand,          due to fetal position ---------------------------------------------------------------------- Cervix Uterus Adnexa  Cervix  Length:           4.86  cm.  Normal appearance by transabdominal scan.  Uterus  No abnormality visualized.  Right Ovary  Within normal limits.  Left Ovary  Within normal limits. ---------------------------------------------------------------------- Impression  Gestational diabetes.  Patient takes Metformin for control.  Blood pressure today at her office is 116/71 mmHg.  Fetal growth is appropriate for gestational age .Amniotic fluid  is normal and good fetal activity is seen .  Fetal anatomical  survey was completed and appeared normal (limited views of  upper extremity). ---------------------------------------------------------------------- Recommendations  BPP in 3 weeks and then weekly till delivery. ----------------------------------------------------------------------                  Noralee Space, MD Electronically Signed Final Report   03/14/2020 09:26 am ----------------------------------------------------------------------

## 2020-03-30 LAB — COMPREHENSIVE METABOLIC PANEL
ALT: 33 U/L (ref 0–44)
AST: 33 U/L (ref 15–41)
Albumin: 2.4 g/dL — ABNORMAL LOW (ref 3.5–5.0)
Alkaline Phosphatase: 108 U/L (ref 38–126)
Anion gap: 10 (ref 5–15)
BUN: 8 mg/dL (ref 6–20)
CO2: 18 mmol/L — ABNORMAL LOW (ref 22–32)
Calcium: 8.3 mg/dL — ABNORMAL LOW (ref 8.9–10.3)
Chloride: 110 mmol/L (ref 98–111)
Creatinine, Ser: 0.55 mg/dL (ref 0.44–1.00)
GFR, Estimated: >60 mL/min (ref >60)
Glucose, Bld: 88 mg/dL (ref 70–99)
Potassium: 3.8 mmol/L (ref 3.5–5.1)
Sodium: 138 mmol/L (ref 135–145)
Total Bilirubin: 0.2 mg/dL — ABNORMAL LOW (ref 0.3–1.2)
Total Protein: 6.5 g/dL (ref 6.5–8.1)

## 2020-03-30 LAB — CBC WITH DIFFERENTIAL/PLATELET
Abs Immature Granulocytes: 0.08 10*3/uL — ABNORMAL HIGH (ref 0.00–0.07)
Basophils Absolute: 0 10*3/uL (ref 0.0–0.1)
Basophils Relative: 0 %
Eosinophils Absolute: 0 10*3/uL (ref 0.0–0.5)
Eosinophils Relative: 0 %
HCT: 41.8 % (ref 36.0–46.0)
Hemoglobin: 14 g/dL (ref 12.0–15.0)
Immature Granulocytes: 2 %
Lymphocytes Relative: 26 %
Lymphs Abs: 1.4 10*3/uL (ref 0.7–4.0)
MCH: 29.6 pg (ref 26.0–34.0)
MCHC: 33.5 g/dL (ref 30.0–36.0)
MCV: 88.4 fL (ref 80.0–100.0)
Monocytes Absolute: 0.4 10*3/uL (ref 0.1–1.0)
Monocytes Relative: 7 %
Neutro Abs: 3.5 10*3/uL (ref 1.7–7.7)
Neutrophils Relative %: 65 %
Platelets: 169 10*3/uL (ref 150–400)
RBC: 4.73 MIL/uL (ref 3.87–5.11)
RDW: 12.8 % (ref 11.5–15.5)
WBC: 5.4 10*3/uL (ref 4.0–10.5)
nRBC: 0 % (ref 0.0–0.2)

## 2020-03-30 LAB — MAGNESIUM: Magnesium: 1.9 mg/dL (ref 1.7–2.4)

## 2020-03-30 LAB — C-REACTIVE PROTEIN: CRP: 0.9 mg/dL (ref <1.0)

## 2020-03-30 LAB — GLUCOSE, CAPILLARY
Glucose-Capillary: 112 mg/dL — ABNORMAL HIGH (ref 70–99)
Glucose-Capillary: 59 mg/dL — ABNORMAL LOW (ref 70–99)
Glucose-Capillary: 96 mg/dL (ref 70–99)
Glucose-Capillary: 109 mg/dL — ABNORMAL HIGH (ref 70–99)
Glucose-Capillary: 129 mg/dL — ABNORMAL HIGH (ref 70–99)
Glucose-Capillary: 122 mg/dL — ABNORMAL HIGH (ref 70–99)

## 2020-03-30 LAB — BRAIN NATRIURETIC PEPTIDE: B Natriuretic Peptide: 51.8 pg/mL (ref 0.0–100.0)

## 2020-03-30 LAB — D-DIMER, QUANTITATIVE: D-Dimer, Quant: 0.54 ug/mL-FEU — ABNORMAL HIGH (ref 0.00–0.50)

## 2020-03-30 LAB — PROCALCITONIN: Procalcitonin: 0.2 ng/mL

## 2020-03-30 MED ORDER — LACTATED RINGERS IV SOLN: INTRAVENOUS | Status: AC

## 2020-03-30 MED ORDER — INSULIN ASPART 100 UNIT/ML ~~LOC~~ SOLN: 0.0000 [IU] | Freq: Three times a day (TID) | SUBCUTANEOUS | Status: DC

## 2020-03-30 NOTE — Progress Notes (Signed)
Hypoglycemic Event  CBG: 59  Treatment: Snack PO   Symptoms: N/A  Follow-up CBG: [id number] CBG Result: 112  Possible Reasons for Event: Not eating bedtime snack     Audley Hinojos J Gyasi Hazzard

## 2020-03-30 NOTE — Progress Notes (Signed)
PROGRESS NOTE                                                                                                                                                                                                             Patient Demographics:    Aimee Benjamin, is a 40 y.o. female, DOB - 12-19-1980, ZOX:[id number]  Outpatient Primary MD for the patient is No primary care provider on file.    LOS - 3  Admit date - 03/26/2020    Chief Complaint  Patient presents with  . Chest Pain  . Covid Positive       Brief Narrative (HPI from H&P) - Aimee Benjamin is a 40 y.o. [id number] (Patient's last menstrual period was 08/01/2019.), with the above CC. PMHx is significant for AMA >40, BMI 33, GDM2, she was admitted to the [facility] unit with symptoms of cough and mild exertional shortness of breath, she was diagnosed with COVID-19 pneumonia and admitted to the [facility] hospital.  [facility] was consulted for Covid management.   Subjective:   Patient in bed, appears comfortable, denies any headache, no fever, no chest pain or pressure, mild cough but no shortness of breath , no abdominal pain. No focal weakness.    Assessment  & Plan :     Acute Covid 19 Viral Pneumonitis - she is unfortunately not vaccinated and seems to have incurred mild to moderate parenchymal lung injury, on IV steroids and Remdesivir and doing extremely well, symptom-free on room air, finishes her steroids and remdesivir course on 03/31/2020.  Thereafter stable from medicine standpoint for home discharge, if upon ambulation her pulse ox drops below 95% home oxygen can be arranged, must go home on a albuterol rescue inhaler and oxygen if needed.   Encouraged the patient to sit up in chair in the daytime use I-S and flutter valve for pulmonary toiletry and then prone in bed when at night.  Will advance activity and titrate down oxygen as possible.   SpO2: 98 %  Recent Labs  Lab  03/27/20 0112 03/27/20 0114 03/27/20 1718 03/28/20 0749 03/29/20 0736 03/30/20 0651  WBC 5.2  --   --  3.5* 3.9* 5.4  HGB 13.6  --   --  13.8 13.6 14.0  HCT 39.9  --   --  40.0 41.0 41.8  PLT 112*  --   --  127* 144* 169  CRP  --   --  5.0* 4.1* 1.9* 0.9  BNP  --   --  20.3 24.2 46.6 51.8  DDIMER  --   --  0.92* 0.55* 0.48 0.54*  PROCALCITON  --   --  0.45 0.34 0.25  --   AST  --   --  49* 40 40 33  ALT  --   --  40 37 36 33  ALKPHOS  --   --  96 105 108 108  BILITOT  --   --  0.6 0.5 0.3 0.2*  ALBUMIN  --   --  2.6* 2.4* 2.4* 2.4*  SARSCOV2NAA  --  POSITIVE*  --   --   --   --    Lab Results  Component Value Date   HGBA1C 5.2 02/19/2020   CBG (last 3)  Recent Labs    03/29/20 2249 03/30/20 0631 03/30/20 0658  GLUCAP 254* 59* 112*     2.  [redacted] weeks pregnant, type 2 diabetes.  Defer to primary team which is OB.         Condition - Fair  Family Communication  :  None bedside  Code Status :  Full  Consults  :  [facility] consulting for primary team OB.  Procedures  :    PUD Prophylaxis : None   DVT Prophylaxis  :  Lovenox   Lab Results  Component Value Date   PLT 169 03/30/2020    Diet :  Diet Order            Diet gestational carb mod Fluid consistency: Thin; Room service appropriate? Yes  Diet effective now                  Inpatient Medications  Scheduled Meds: . albuterol  2 puff Inhalation Q6H  . vitamin C  500 mg Oral Daily  . cholecalciferol  1,000 Units Oral Daily  . dexamethasone (DECADRON) injection  6 mg Intravenous Q24H  . enoxaparin (LOVENOX) injection  40 mg Subcutaneous Q24H  . fluticasone  1 spray Each Nare BID  . insulin aspart  0-12 Units Subcutaneous TID PC  . prenatal multivitamin  1 tablet Oral Q1200  . sodium chloride  1 spray Each Nare TID  . sodium chloride flush  3 mL Intravenous Q12H  . zinc sulfate  220 mg Oral Daily   Continuous Infusions: . sodium chloride Stopped (03/27/20 2035)  . lactated ringers    .  remdesivir 100 mg in NS 100 mL Stopped (03/29/20 1049)   PRN Meds:.sodium chloride, acetaminophen, calcium carbonate, docusate sodium, [DISCONTINUED] ondansetron **OR** ondansetron (ZOFRAN) IV, sodium chloride flush  Antibiotics  :    Anti-infectives (From admission, onward)   Start     Dose/Rate Route Frequency Ordered Stop   03/28/20 1000  remdesivir 100 mg in sodium chloride 0.9 % 100 mL IVPB       "Followed by" Linked Group Details   100 mg 200 mL/hr over 30 Minutes Intravenous Daily 03/27/20 1651 04/01/20 0959   03/27/20 1800  remdesivir 200 mg in sodium chloride 0.9% 250 mL IVPB       "Followed by" Linked Group Details   200 mg 580 mL/hr over 30 Minutes Intravenous Once 03/27/20 1651 03/27/20 2034       Time Spent in minutes  30   Susa Raring M.D on 03/30/2020 at 8:25 AM  To page go to [email or web address]   [facility] -  Office  [phone]    See all Orders from today for further details    Objective:   Vitals:   03/29/20 1745 03/29/20 2031 03/29/20 2253 03/30/20 0633  BP:  114/74 114/71 110/71  Pulse:  77 74 78  Resp:  19 18 18   Temp:  98.3 F (36.8 C) 98.2 F (36.8 C) 97.7 F (36.5 C)  TempSrc:  Oral Oral Oral  SpO2: 98% 99% 100% 98%  Weight:      Height:        Wt Readings from Last 3 Encounters:  03/27/20 75.8 kg  03/23/20 75.8 kg  03/03/20 76.2 kg     Intake/Output Summary (Last 24 hours) at 03/30/2020 0825 Last data filed at 03/29/2020 1849 Gross per 24 hour  Intake 340 ml  Output --  Net 340 ml     Physical Exam  Awake Alert, No new F.N deficits, Normal affect [place].AT,PERRAL Supple Neck,No JVD, No cervical lymphadenopathy appriciated.  Symmetrical Chest wall movement, Good air movement bilaterally, CTAB RRR,No Gallops, Rubs or new Murmurs, No Parasternal Heave +ve B.Sounds, Abd distended due to pregnancy. No Cyanosis, Clubbing or edema, No new Rash or bruise     Data Review:    CBC Recent Labs  Lab 03/27/20 0112  03/28/20 0749 03/29/20 0736 03/30/20 0651  WBC 5.2 3.5* 3.9* 5.4  HGB 13.6 13.8 13.6 14.0  HCT 39.9 40.0 41.0 41.8  PLT 112* 127* 144* 169  MCV 88.7 87.7 88.7 88.4  MCH 30.2 30.3 29.4 29.6  MCHC 34.1 34.5 33.2 33.5  RDW 12.9 13.0 12.9 12.8  LYMPHSABS  --  1.1 1.4 1.4  MONOABS  --  0.3 0.3 0.4  EOSABS  --  0.0 0.0 0.0  BASOSABS  --  0.0 0.0 0.0    Recent Labs  Lab 03/27/20 0112 03/27/20 1718 03/28/20 0749 03/29/20 0736 03/30/20 0651  NA 134*  --  134* 138 138  K 4.2  --  3.7 3.7 3.8  CL 107  --  107 110 110  CO2 16*  --  14* 16* 18*  GLUCOSE 86  --  89 84 88  BUN 9  --  7 10 8   CREATININE 0.61  --  0.53 0.56 0.55  CALCIUM 8.2*  --  8.0* 8.1* 8.3*  AST  --  49* 40 40 33  ALT  --  40 37 36 33  ALKPHOS  --  96 105 108 108  BILITOT  --  0.6 0.5 0.3 0.2*  ALBUMIN  --  2.6* 2.4* 2.4* 2.4*  MG  --   --  2.0 2.0 1.9  CRP  --  5.0* 4.1* 1.9* 0.9  DDIMER  --  0.92* 0.55* 0.48 0.54*  PROCALCITON  --  0.45 0.34 0.25  --   BNP  --  20.3 24.2 46.6 51.8    ------------------------------------------------------------------------------------------------------------------ No results for input(s): CHOL, HDL, LDLCALC, TRIG, CHOLHDL, LDLDIRECT in the last 72 hours.  Lab Results  Component Value Date   HGBA1C 5.2 02/19/2020   ------------------------------------------------------------------------------------------------------------------ No results for input(s): TSH, T4TOTAL, T3FREE, THYROIDAB in the last 72 hours.  Invalid input(s): FREET3  Cardiac Enzymes No results for input(s): CKMB, TROPONINI, MYOGLOBIN in the last 168 hours.  Invalid input(s): CK ------------------------------------------------------------------------------------------------------------------    Component Value Date/Time   BNP 51.8 03/30/2020 0651    Micro Results Recent Results (from the past 240 hour(s))  Resp Panel by RT-PCR (Flu A&B, Covid) Nasopharyngeal Swab     Status: Abnormal    Collection  Time: 03/27/20  1:14 AM   Specimen: Nasopharyngeal Swab; Nasopharyngeal(NP) swabs in vial transport medium  Result Value Ref Range Status   SARS Coronavirus 2 by RT PCR POSITIVE (A) NEGATIVE Final    Comment: RESULT CALLED TO, READ BACK BY AND VERIFIED WITH: RN FLORES MELANIE AT 0237 BY MESSAN H. ON 03/27/2020 (NOTE) SARS-CoV-2 target nucleic acids are DETECTED.  The SARS-CoV-2 RNA is generally detectable in upper respiratory specimens during the acute phase of infection. Positive results are indicative of the presence of the identified virus, but do not rule out bacterial infection or co-infection with other pathogens not detected by the test. Clinical correlation with patient history and other diagnostic information is necessary to determine patient infection status. The expected result is Negative.  Fact Sheet for Patients: BloggerCourse.com  Fact Sheet for Healthcare Providers: SeriousBroker.it  This test is not yet approved or cleared by the [place] FDA and  has been authorized for detection and/or diagnosis of SARS-CoV-2 by FDA under an Emergency Use Authorization (EUA).  This EUA will remain in effect (meaning  this test can be used) for the duration of  the COVID-19 declaration under Section 564(b)(1) of the Act, 21 U.S.C. section 360bbb-3(b)(1), unless the authorization is terminated or revoked sooner.     Influenza A by PCR NEGATIVE NEGATIVE Final   Influenza B by PCR NEGATIVE NEGATIVE Final    Comment: (NOTE) The Xpert Xpress SARS-CoV-2/FLU/RSV plus assay is intended as an aid in the diagnosis of influenza from Nasopharyngeal swab specimens and should not be used as a sole basis for treatment. Nasal washings and aspirates are unacceptable for Xpert Xpress SARS-CoV-2/FLU/RSV testing.  Fact Sheet for Patients: BloggerCourse.com  Fact Sheet for Healthcare  Providers: SeriousBroker.it  This test is not yet approved or cleared by the [place] FDA and has been authorized for detection and/or diagnosis of SARS-CoV-2 by FDA under an Emergency Use Authorization (EUA). This EUA will remain in effect (meaning this test can be used) for the duration of the COVID-19 declaration under Section 564(b)(1) of the Act, 21 U.S.C. section 360bbb-3(b)(1), unless the authorization is terminated or revoked.  Performed at [facility], [address], [place], [place] [address]     Radiology Reports DG Chest Portable 1 View  Result Date: 03/27/2020 CLINICAL DATA:  Shortness of breath. EXAM: PORTABLE CHEST 1 VIEW COMPARISON:  None. FINDINGS: Lung volumes are low. Bronchovascular crowding related to low lung volumes. There may be underlying peribronchial thickening. Upper normal heart size likely accentuated by AP technique. Normal mediastinal contours. No confluent consolidation. No pleural fluid. No pneumothorax. No acute osseous abnormalities are seen. IMPRESSION: 1. Low lung volumes with bronchovascular crowding. Possible underlying peribronchial thickening. 2. Upper normal heart size likely accentuated by AP technique. Electronically Signed   By: Narda Rutherford M.D.   On: 03/27/2020 01:40   [place] MFM OB FOLLOW UP  Result Date: 03/14/2020 ----------------------------------------------------------------------  OBSTETRICS REPORT                       (Signed Final 03/14/2020 09:26 am) ---------------------------------------------------------------------- Patient Info  ID #:       [id number]                          D.O.B.:  05/14/1980 (39 yrs)  Name:       Aimee Benjamin               Visit Date: 03/14/2020  09:00 am ---------------------------------------------------------------------- Performed By  Attending:        Noralee Space MD        Ref. Address:     [address]                                                              [place], [place]                                                             [address]  Performed By:     Jenel Lucks     [facility]  Referred By:      [facility] ---------------------------------------------------------------------- Orders  #  Description                           Code        Ordered By  1  [place] MFM OB FOLLOW UP                   [phone]    Noralee Space ----------------------------------------------------------------------  #  Order #                     Accession #                Episode #  1  [id number]                   [id number]                 [id number] ---------------------------------------------------------------------- Indications  [redacted] weeks gestation of pregnancy                Z3A.29  Gestational diabetes in pregnancy,             O24.415  controlled by oral  hypoglycemic drugs  (metformin)  Advanced maternal age multigravida 9035+,        60O09.523  third trimester (39yo)  Poor obstetric history: Hx of preterm          O09.219  delivery, currently pregnant  Poor obstetrical history: 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  Obesity complicating pregnancy, third          O99.213  trimester (BMI 30) ---------------------------------------------------------------------- Fetal Evaluation  Num Of Fetuses:         1  Fetal Heart Rate(bpm):  126  Cardiac Activity:       Observed  Presentation:           Variable  Placenta:               Posterior  Amniotic Fluid  AFI FV:      Within normal limits  AFI Sum(cm)     %Tile       Largest Pocket(cm)  12.58           34          6.61  RUQ(cm)       RLQ(cm)       LUQ(cm)        LLQ(cm)  6.61          1.83          0              4.14  ---------------------------------------------------------------------- Biometry  BPD:      78.4  mm     G. Age:  31w 3d         87  %    CI:        75.83   %    70 - 86                                                          FL/HC:      19.8   %    19.2 - 21.4  HC:      285.4  mm     G. Age:  31w 2d         64  %    HC/AC:      1.05        0.99 - 1.21  AC:      272.3  mm     G. Age:  31w 2d         87  %    FL/BPD:     72.1   %    71 - 87  FL:       56.5  mm     G. Age:  29w 5d         34  %    FL/AC:      20.7   %    20 - 24  LV:        4.5  mm  Est. FW:    1645  gm    3 lb 10 oz      77  % ---------------------------------------------------------------------- OB History  Gravidity:    9         Term:   3  Living:       3 ---------------------------------------------------------------------- Gestational Age  LMP:  32w 2d        Date:  08/01/19                 EDD:   05/07/20  U/S Today:     31w 0d                                        EDD:   05/16/20  Best:          29w 5d     Det. ByMarcella Dubs         EDD:   05/25/20                                      (01/11/20) ---------------------------------------------------------------------- Anatomy  Cranium:               Appears normal         LVOT:                   Appears normal  Cavum:                 Previously seen        Aortic Arch:            Appears normal  Ventricles:            Appears normal         Ductal Arch:            Appears normal  Choroid Plexus:        Previously seen        Diaphragm:              Appears normal  Cerebellum:            Previously seen        Stomach:                Appears normal, left                                                                        sided  Posterior Fossa:       Previously seen        Abdomen:                Appears normal  Nuchal Fold:           Not applicable (>20    Abdominal Wall:         Previously seen                         wks GA)  Face:                  Orbits and profile      Cord Vessels:           Appears normal (3                         previously seen  vessel cord)  Lips:                  Appears normal         Kidneys:                Appear normal  Palate:                Appears normal         Bladder:                Appears normal  Thoracic:              Appears normal         Spine:                  Limited views of                                                                        cspine  Heart:                 Appears normal         Upper Extremities:      Limited views                         (4CH, axis, and                         situs)  RVOT:                  Appears normal         Lower Extremities:      Previously seen  Other:  Unable to obtain images of cspine and right ulna/radius and hand,          due to fetal position ---------------------------------------------------------------------- Cervix Uterus Adnexa  Cervix  Length:           4.86  cm.  Normal appearance by transabdominal scan.  Uterus  No abnormality visualized.  Right Ovary  Within normal limits.  Left Ovary  Within normal limits. ---------------------------------------------------------------------- Impression  Gestational diabetes.  Patient takes Metformin for control.  Blood pressure today at her office is 116/71 mmHg.  Fetal growth is appropriate for gestational age .Amniotic fluid  is normal and good fetal activity is seen .  Fetal anatomical  survey was completed and appeared normal (limited views of  upper extremity). ---------------------------------------------------------------------- Recommendations  BPP in 3 weeks and then weekly till delivery. ----------------------------------------------------------------------                  Noralee Spaceavi Shankar, MD Electronically Signed Final Report   03/14/2020 09:26 am ----------------------------------------------------------------------

## 2020-03-30 NOTE — Progress Notes (Signed)
FACULTY PRACTICE ANTEPARTUM PROGRESS NOTE  Aimee Benjamin is a 40 y.o. [phone] at [id number] who is admitted for COVID infection.  Estimated Date of Delivery: 05/25/20 Fetal presentation is unsure.  Length of Stay:  3 Days. Admitted 03/26/2020  Subjective: Patient reports her shortness of breath is much improved, almost gone. She is able to ambulate without issue. Patient reports normal fetal movement.  She denies uterine contractions, denies bleeding and leaking of fluid per vagina.  Vitals:  Blood pressure 108/87, pulse 75, temperature 98.2 F (36.8 C), temperature source Oral, resp. rate 19, height 5' (1.524 m), weight 75.8 kg, last menstrual period 08/01/2019, SpO2 98 %, unknown if currently breastfeeding. Physical Examination: CONSTITUTIONAL: Well-developed, well-nourished female in no acute distress.  HENT:  Normocephalic, atraumatic, External right and left ear normal. Oropharynx is clear and moist EYES: Conjunctivae and EOM are normal. Pupils are equal, round, and reactive to light. No scleral icterus.  NECK: Normal range of motion, supple, no masses. SKIN: Skin is warm and dry. No rash noted. Not diaphoretic. No erythema. No pallor. NEUROLGIC: Alert and oriented to person, place, and time. Normal reflexes, muscle tone coordination. No cranial nerve deficit noted. PSYCHIATRIC: Normal mood and affect. Normal behavior. Normal judgment and thought content. CARDIOVASCULAR: Normal heart rate noted RESPIRATORY: Effort normal, no problems with respiration noted MUSCULOSKELETAL: Normal range of motion. No edema and no tenderness. ABDOMEN: Soft, nontender, nondistended, gravid. CERVIX: deferred  Fetal monitoring: FHR: 135 bpm, Variability: moderate, Accelerations: Present, Decelerations: Absent  Uterine activity: no contractions per hour  Results for orders placed or performed during the hospital encounter of 03/26/20 (from the past 48 hour(s))  Glucose, capillary     Status: Abnormal    Collection Time: 03/28/20  3:59 PM  Result Value Ref Range   Glucose-Capillary 111 (H) 70 - 99 mg/dL    Comment: Glucose reference range applies only to samples taken after fasting for at least 8 hours.  Glucose, capillary     Status: Abnormal   Collection Time: 03/28/20  9:05 PM  Result Value Ref Range   Glucose-Capillary 200 (H) 70 - 99 mg/dL    Comment: Glucose reference range applies only to samples taken after fasting for at least 8 hours.  Glucose, capillary     Status: None   Collection Time: 03/29/20  7:27 AM  Result Value Ref Range   Glucose-Capillary 85 70 - 99 mg/dL    Comment: Glucose reference range applies only to samples taken after fasting for at least 8 hours.  CBC with Differential/Platelet     Status: Abnormal   Collection Time: 03/29/20  7:36 AM  Result Value Ref Range   WBC 3.9 (L) 4.0 - 10.5 K/uL   RBC 4.62 3.87 - 5.11 MIL/uL   Hemoglobin 13.6 12.0 - 15.0 g/dL   HCT 45.441.0 09.836.0 - 11.946.0 %   MCV 88.7 80.0 - 100.0 fL   MCH 29.4 26.0 - 34.0 pg   MCHC 33.2 30.0 - 36.0 g/dL   RDW 14.712.9 82.911.5 - 56.215.5 %   Platelets 144 (L) 150 - 400 K/uL   nRBC 0.0 0.0 - 0.2 %   Neutrophils Relative % 57 %   Neutro Abs 2.2 1.7 - 7.7 K/uL   Lymphocytes Relative 35 %   Lymphs Abs 1.4 0.7 - 4.0 K/uL   Monocytes Relative 7 %   Monocytes Absolute 0.3 0.1 - 1.0 K/uL   Eosinophils Relative 0 %   Eosinophils Absolute 0.0 0.0 - 0.5 K/uL   Basophils Relative  0 %   Basophils Absolute 0.0 0.0 - 0.1 K/uL   Immature Granulocytes 1 %   Abs Immature Granulocytes 0.05 0.00 - 0.07 K/uL    Comment: Performed at [facility], [address], [place], [place] [address]  Comprehensive metabolic panel     Status: Abnormal   Collection Time: 03/29/20  7:36 AM  Result Value Ref Range   Sodium 138 135 - 145 mmol/L   Potassium 3.7 3.5 - 5.1 mmol/L   Chloride 110 98 - 111 mmol/L   CO2 16 (L) 22 - 32 mmol/L   Glucose, Bld 84 70 - 99 mg/dL    Comment: Glucose reference range applies only to samples  taken after fasting for at least 8 hours.   BUN 10 6 - 20 mg/dL   Creatinine, Ser 5.92 0.44 - 1.00 mg/dL   Calcium 8.1 (L) 8.9 - 10.3 mg/dL   Total Protein 6.1 (L) 6.5 - 8.1 g/dL   Albumin 2.4 (L) 3.5 - 5.0 g/dL   AST 40 15 - 41 U/L   ALT 36 0 - 44 U/L   Alkaline Phosphatase 108 38 - 126 U/L   Total Bilirubin 0.3 0.3 - 1.2 mg/dL   GFR, Estimated >92 >44 mL/min    Comment: (NOTE) Calculated using the CKD-EPI Creatinine Equation (2021)    Anion gap 12 5 - 15    Comment: Performed at [facility], [address], [place], [place] [address]  C-reactive protein     Status: Abnormal   Collection Time: 03/29/20  7:36 AM  Result Value Ref Range   CRP 1.9 (H) <1.0 mg/dL    Comment: Performed at [facility], [address] [place], [place] [address]  D-dimer, quantitative (not at [facility])     Status: None   Collection Time: 03/29/20  7:36 AM  Result Value Ref Range   D-Dimer, Quant 0.48 0.00 - 0.50 ug/mL-FEU    Comment: (NOTE) At the manufacturer cut-off value of 0.5 g/mL FEU, this assay has a negative predictive value of 95-100%.This assay is intended for use in conjunction with a clinical pretest probability (PTP) assessment model to exclude pulmonary embolism (PE) and deep venous thrombosis (DVT) in outpatients suspected of PE or DVT. Results should be correlated with clinical presentation. Performed at [facility], [address] [place], [place] [address]   Magnesium     Status: None   Collection Time: 03/29/20  7:36 AM  Result Value Ref Range   Magnesium 2.0 1.7 - 2.4 mg/dL    Comment: Performed at [facility], [address], [place], [place] [address]  Brain natriuretic peptide     Status: None   Collection Time: 03/29/20  7:36 AM  Result Value Ref Range   B Natriuretic Peptide 46.6 0.0 - 100.0 pg/mL    Comment: Performed at [facility], [address], [place], [place] [address]  Procalcitonin     Status: None   Collection Time: 03/29/20   7:36 AM  Result Value Ref Range   Procalcitonin 0.25 ng/mL    Comment:        Interpretation: PCT (Procalcitonin) <= 0.5 ng/mL: Systemic infection (sepsis) is not likely. Local bacterial infection is possible. (NOTE)       Sepsis PCT Algorithm           Lower Respiratory Tract  Infection PCT Algorithm    ----------------------------     ----------------------------         PCT < 0.25 ng/mL                PCT < 0.10 ng/mL          Strongly encourage             Strongly discourage   discontinuation of antibiotics    initiation of antibiotics    ----------------------------     -----------------------------       PCT 0.25 - 0.50 ng/mL            PCT 0.10 - 0.25 ng/mL               OR       >80% decrease in PCT            Discourage initiation of                                            antibiotics      Encourage discontinuation           of antibiotics    ----------------------------     -----------------------------         PCT >= 0.50 ng/mL              PCT 0.26 - 0.50 ng/mL               AND        <80% decrease in PCT             Encourage initiation of                                             antibiotics       Encourage continuation           of antibiotics    ----------------------------     -----------------------------        PCT >= 0.50 ng/mL                  PCT > 0.50 ng/mL               AND         increase in PCT                  Strongly encourage                                      initiation of antibiotics    Strongly encourage escalation           of antibiotics                                     -----------------------------                                           PCT <= 0.25 ng/mL  OR                                        > 80% decrease in PCT                                      Discontinue / Do not initiate                                              antibiotics  Performed at [facility], [address], [place], [place] [address]   Glucose, capillary     Status: Abnormal   Collection Time: 03/29/20  9:26 AM  Result Value Ref Range   Glucose-Capillary 110 (H) 70 - 99 mg/dL    Comment: Glucose reference range applies only to samples taken after fasting for at least 8 hours.  Glucose, capillary     Status: Abnormal   Collection Time: 03/29/20  2:28 PM  Result Value Ref Range   Glucose-Capillary 104 (H) 70 - 99 mg/dL    Comment: Glucose reference range applies only to samples taken after fasting for at least 8 hours.  Glucose, capillary     Status: Abnormal   Collection Time: 03/29/20 10:49 PM  Result Value Ref Range   Glucose-Capillary 254 (H) 70 - 99 mg/dL    Comment: Glucose reference range applies only to samples taken after fasting for at least 8 hours.  Glucose, capillary     Status: Abnormal   Collection Time: 03/30/20  6:31 AM  Result Value Ref Range   Glucose-Capillary 59 (L) 70 - 99 mg/dL    Comment: Glucose reference range applies only to samples taken after fasting for at least 8 hours.  CBC with Differential/Platelet     Status: Abnormal   Collection Time: 03/30/20  6:51 AM  Result Value Ref Range   WBC 5.4 4.0 - 10.5 K/uL   RBC 4.73 3.87 - 5.11 MIL/uL   Hemoglobin 14.0 12.0 - 15.0 g/dL   HCT 72.0 94.7 - 09.6 %   MCV 88.4 80.0 - 100.0 fL   MCH 29.6 26.0 - 34.0 pg   MCHC 33.5 30.0 - 36.0 g/dL   RDW 28.3 66.2 - 94.7 %   Platelets 169 150 - 400 K/uL   nRBC 0.0 0.0 - 0.2 %   Neutrophils Relative % 65 %   Neutro Abs 3.5 1.7 - 7.7 K/uL   Lymphocytes Relative 26 %   Lymphs Abs 1.4 0.7 - 4.0 K/uL   Monocytes Relative 7 %   Monocytes Absolute 0.4 0.1 - 1.0 K/uL   Eosinophils Relative 0 %   Eosinophils Absolute 0.0 0.0 - 0.5 K/uL   Basophils Relative 0 %   Basophils Absolute 0.0 0.0 - 0.1 K/uL   Immature Granulocytes 2 %   Abs Immature Granulocytes 0.08 (H) 0.00 - 0.07 K/uL    Comment: Performed at [facility], [address], [place], [place] [address]  Comprehensive metabolic panel     Status: Abnormal   Collection Time: 03/30/20  6:51 AM  Result Value Ref Range   Sodium 138 135 - 145 mmol/L   Potassium 3.8 3.5 - 5.1 mmol/L  Chloride 110 98 - 111 mmol/L   CO2 18 (L) 22 - 32 mmol/L   Glucose, Bld 88 70 - 99 mg/dL    Comment: Glucose reference range applies only to samples taken after fasting for at least 8 hours.   BUN 8 6 - 20 mg/dL   Creatinine, Ser 3.55 0.44 - 1.00 mg/dL   Calcium 8.3 (L) 8.9 - 10.3 mg/dL   Total Protein 6.5 6.5 - 8.1 g/dL   Albumin 2.4 (L) 3.5 - 5.0 g/dL   AST 33 15 - 41 U/L   ALT 33 0 - 44 U/L   Alkaline Phosphatase 108 38 - 126 U/L   Total Bilirubin 0.2 (L) 0.3 - 1.2 mg/dL   GFR, Estimated >97 >41 mL/min    Comment: (NOTE) Calculated using the CKD-EPI Creatinine Equation (2021)    Anion gap 10 5 - 15    Comment: Performed at [facility], [address] [place], [place] [address]  C-reactive protein     Status: None   Collection Time: 03/30/20  6:51 AM  Result Value Ref Range   CRP 0.9 <1.0 mg/dL    Comment: Performed at [facility], [address], [place], [place] [address]  D-dimer, quantitative (not at [facility])     Status: Abnormal   Collection Time: 03/30/20  6:51 AM  Result Value Ref Range   D-Dimer, Quant 0.54 (H) 0.00 - 0.50 ug/mL-FEU    Comment: (NOTE) At the manufacturer cut-off value of 0.5 g/mL FEU, this assay has a negative predictive value of 95-100%.This assay is intended for use in conjunction with a clinical pretest probability (PTP) assessment model to exclude pulmonary embolism (PE) and deep venous thrombosis (DVT) in outpatients suspected of PE or DVT. Results should be correlated with clinical presentation. Performed at [facility], [address] [place], [place] [address]   Magnesium     Status: None   Collection Time: 03/30/20  6:51 AM  Result Value Ref Range   Magnesium 1.9 1.7 - 2.4 mg/dL     Comment: Performed at [facility], [address], [place], [place] [address]  Brain natriuretic peptide     Status: None   Collection Time: 03/30/20  6:51 AM  Result Value Ref Range   B Natriuretic Peptide 51.8 0.0 - 100.0 pg/mL    Comment: Performed at [facility], [address], [place], [place] [address]  Procalcitonin     Status: None   Collection Time: 03/30/20  6:51 AM  Result Value Ref Range   Procalcitonin 0.20 ng/mL    Comment:        Interpretation: PCT (Procalcitonin) <= 0.5 ng/mL: Systemic infection (sepsis) is not likely. Local bacterial infection is possible. (NOTE)       Sepsis PCT Algorithm           Lower Respiratory Tract                                      Infection PCT Algorithm    ----------------------------     ----------------------------         PCT < 0.25 ng/mL                PCT < 0.10 ng/mL          Strongly encourage  Strongly discourage   discontinuation of antibiotics    initiation of antibiotics    ----------------------------     -----------------------------       PCT 0.25 - 0.50 ng/mL            PCT 0.10 - 0.25 ng/mL               OR       >80% decrease in PCT            Discourage initiation of                                            antibiotics      Encourage discontinuation           of antibiotics    ----------------------------     -----------------------------         PCT >= 0.50 ng/mL              PCT 0.26 - 0.50 ng/mL               AND        <80% decrease in PCT             Encourage initiation of                                             antibiotics       Encourage continuation           of antibiotics    ----------------------------     -----------------------------        PCT >= 0.50 ng/mL                  PCT > 0.50 ng/mL               AND         increase in PCT                  Strongly encourage                                      initiation of antibiotics    Strongly encourage escalation            of antibiotics                                     -----------------------------                                           PCT <= 0.25 ng/mL                                                 OR                                        >  80% decrease in PCT                                      Discontinue / Do not initiate                                             antibiotics  Performed at [facility], [address] [place], [place] [id number]   Glucose, capillary     Status: Abnormal   Collection Time: 03/30/20  6:58 AM  Result Value Ref Range   Glucose-Capillary 112 (H) 70 - 99 mg/dL    Comment: Glucose reference range applies only to samples taken after fasting for at least 8 hours.  Glucose, capillary     Status: None   Collection Time: 03/30/20 11:17 AM  Result Value Ref Range   Glucose-Capillary 96 70 - 99 mg/dL    Comment: Glucose reference range applies only to samples taken after fasting for at least 8 hours.    I have reviewed the patient's current medications.  ASSESSMENT: Active Problems:   Multigravida of advanced maternal age in third trimester   Gestational diabetes mellitus (GDM), antepartum   BMI 32.0-32.9,adult   Language barrier   COVID-19 affecting pregnancy in third trimester   Gestational thrombocytopenia (HCC)   PLAN: COVID 19 - cont IV solumedrol - cont remdesivir - O2 prnif sats decrease overnight - per TRH, plan for discharge tomorrow after last dose of remdesvir  T2DM - SSI - monitor CBGs - diabetic diet  Routine prenatal care - Daily NST  Thrombocytopenia - will monitor as pregnancy progresses   Continue routine antenatal care.  Engineer, structuralpanish translator used for interview  Appreciate hospitalist care, recommendations.    Continue routine antenatal care.   Baldemar LenisK. Meryl Shishir Krantz, MD, [facility] Attending [facility] (Faculty Practice)  03/30/2020 12:30 PM

## 2020-03-31 DIAGNOSIS — O99113 Other diseases of the blood and blood-forming organs and certain disorders involving the immune mechanism complicating pregnancy, third trimester: Secondary | ICD-10-CM

## 2020-03-31 DIAGNOSIS — O98513 Other viral diseases complicating pregnancy, third trimester: Principal | ICD-10-CM

## 2020-03-31 DIAGNOSIS — O09523 Supervision of elderly multigravida, third trimester: Secondary | ICD-10-CM

## 2020-03-31 DIAGNOSIS — O24419 Gestational diabetes mellitus in pregnancy, unspecified control: Secondary | ICD-10-CM

## 2020-03-31 DIAGNOSIS — Z3A32 32 weeks gestation of pregnancy: Secondary | ICD-10-CM

## 2020-03-31 DIAGNOSIS — Z603 Acculturation difficulty: Secondary | ICD-10-CM

## 2020-03-31 LAB — CBC WITH DIFFERENTIAL/PLATELET
Abs Immature Granulocytes: 0.05 10*3/uL (ref 0.00–0.07)
Basophils Absolute: 0 10*3/uL (ref 0.0–0.1)
Basophils Relative: 0 %
Eosinophils Absolute: 0 10*3/uL (ref 0.0–0.5)
Eosinophils Relative: 0 %
HCT: 40.5 % (ref 36.0–46.0)
Hemoglobin: 13.4 g/dL (ref 12.0–15.0)
Immature Granulocytes: 1 %
Lymphocytes Relative: 23 %
Lymphs Abs: 1.4 10*3/uL (ref 0.7–4.0)
MCH: 29.1 pg (ref 26.0–34.0)
MCHC: 33.1 g/dL (ref 30.0–36.0)
MCV: 88 fL (ref 80.0–100.0)
Monocytes Absolute: 0.4 10*3/uL (ref 0.1–1.0)
Monocytes Relative: 6 %
Neutro Abs: 4.3 10*3/uL (ref 1.7–7.7)
Neutrophils Relative %: 70 %
Platelets: 169 10*3/uL (ref 150–400)
RBC: 4.6 MIL/uL (ref 3.87–5.11)
RDW: 12.9 % (ref 11.5–15.5)
WBC: 6.1 10*3/uL (ref 4.0–10.5)
nRBC: 0.3 % — ABNORMAL HIGH (ref 0.0–0.2)

## 2020-03-31 LAB — GLUCOSE, CAPILLARY
Glucose-Capillary: 91 mg/dL (ref 70–99)
Glucose-Capillary: 87 mg/dL (ref 70–99)

## 2020-03-31 LAB — D-DIMER, QUANTITATIVE: D-Dimer, Quant: 0.48 ug/mL-FEU (ref 0.00–0.50)

## 2020-03-31 LAB — COMPREHENSIVE METABOLIC PANEL
ALT: 29 U/L (ref 0–44)
AST: 28 U/L (ref 15–41)
Albumin: 2.4 g/dL — ABNORMAL LOW (ref 3.5–5.0)
Alkaline Phosphatase: 102 U/L (ref 38–126)
Anion gap: 11 (ref 5–15)
BUN: 8 mg/dL (ref 6–20)
CO2: 17 mmol/L — ABNORMAL LOW (ref 22–32)
Calcium: 8.4 mg/dL — ABNORMAL LOW (ref 8.9–10.3)
Chloride: 107 mmol/L (ref 98–111)
Creatinine, Ser: 0.55 mg/dL (ref 0.44–1.00)
GFR, Estimated: >60 mL/min (ref >60)
Glucose, Bld: 93 mg/dL (ref 70–99)
Potassium: 3.8 mmol/L (ref 3.5–5.1)
Sodium: 135 mmol/L (ref 135–145)
Total Bilirubin: 0.4 mg/dL (ref 0.3–1.2)
Total Protein: 5.9 g/dL — ABNORMAL LOW (ref 6.5–8.1)

## 2020-03-31 LAB — BRAIN NATRIURETIC PEPTIDE: B Natriuretic Peptide: 83.2 pg/mL (ref 0.0–100.0)

## 2020-03-31 LAB — MAGNESIUM: Magnesium: 1.8 mg/dL (ref 1.7–2.4)

## 2020-03-31 LAB — PROCALCITONIN: Procalcitonin: 0.14 ng/mL

## 2020-03-31 LAB — C-REACTIVE PROTEIN: CRP: 0.7 mg/dL (ref <1.0)

## 2020-03-31 MED ORDER — ALBUTEROL SULFATE HFA 108 (90 BASE) MCG/ACT IN AERS: 2.0000 | INHALATION_SPRAY | Freq: Four times a day (QID) | RESPIRATORY_TRACT | 3 refills | Status: AC

## 2020-03-31 NOTE — Progress Notes (Signed)
PROGRESS NOTE                                                                                                                                                                                                             Patient Demographics:    Aimee Benjamin, is a 40 y.o. female, DOB - Jan 06, 1981, FBP:[id number]  Outpatient Primary MD for the patient is No primary care provider on file.    LOS - 4  Admit date - 03/26/2020    Chief Complaint  Patient presents with  . Chest Pain  . Covid Positive       Brief Narrative (HPI from H&P) - Aimee Benjamin is a 40 y.o. [id number] (Patient's last menstrual period was 08/01/2019.), with the above CC. PMHx is significant for AMA >40, BMI 33, GDM2, she was admitted to the [facility] unit with symptoms of cough and mild exertional shortness of breath, she was diagnosed with COVID-19 pneumonia and admitted [facility] the [facility] hospital.  [facility] was consulted for Covid management.   Subjective:   Patient in bed, appears comfortable, denies any headache, no fever, no chest pain or pressure, no shortness of breath , no abdominal pain. No focal weakness.   Assessment  & Plan :     Acute Covid 19 Viral Pneumonitis - she is unfortunately not vaccinated and seems to have incurred mild to moderate parenchymal lung injury, on IV steroids and Remdesivir and doing extremely well, symptom-free on room air, finishes her steroids and remdesivir course on 03/31/2020.  Thereafter stable from medicine standpoint for home discharge, if upon ambulation her pulse ox drops below 95% home oxygen can be arranged, must go home on a albuterol rescue inhaler, does not qualify for home oxygen, her quarantine ends on 04/17/2020.   Discharge patient with a rescue albuterol inhaler, I-S and flutter valve with 1 week follow-up with PCP.   SpO2: 98 %  Recent Labs  Lab 03/27/20 0112 03/27/20 0114 03/27/20 1718 03/28/20 0749  03/29/20 0736 03/30/20 0651 03/31/20 0620  WBC 5.2  --   --  3.5* 3.9* 5.4 6.1  HGB 13.6  --   --  13.8 13.6 14.0 13.4  HCT 39.9  --   --  40.0 41.0 41.8 40.5  PLT 112*  --   --  127* 144* 169 169  CRP  --   --  5.0* 4.1* 1.9* 0.9 0.7  BNP  --   --  20.3 24.2 46.6 51.8 83.2  DDIMER  --   --  0.92* 0.55* 0.48 0.54* 0.48  PROCALCITON  --   --  0.45 0.34 0.25 0.20  --   AST  --   --  49* 40 40 33 28  ALT  --   --  40 37 36 33 29  ALKPHOS  --   --  96 105 108 108 102  BILITOT  --   --  0.6 0.5 0.3 0.2* 0.4  ALBUMIN  --   --  2.6* 2.4* 2.4* 2.4* 2.4*  SARSCOV2NAA  --  POSITIVE*  --   --   --   --   --    Lab Results  Component Value Date   HGBA1C 5.2 02/19/2020   CBG (last 3)  Recent Labs    03/30/20 2023 03/30/20 2203 03/31/20 0559  GLUCAP 129* 122* 91     2.  [redacted] weeks pregnant, type 2 diabetes.  Defer to primary team which is OB.         Condition - Fair  Family Communication  :  None bedside  Code Status :  Full  Consults  :  TRH consulting for primary team OB.  Procedures  :    PUD Prophylaxis : None   DVT Prophylaxis  :  Lovenox   Lab Results  Component Value Date   PLT 169 03/31/2020    Diet :  Diet Order            Diet gestational carb mod Fluid consistency: Thin; Room service appropriate? Yes  Diet effective now                  Inpatient Medications  Scheduled Meds: . albuterol  2 puff Inhalation Q6H  . vitamin C  500 mg Oral Daily  . cholecalciferol  1,000 Units Oral Daily  . dexamethasone (DECADRON) injection  6 mg Intravenous Q24H  . enoxaparin (LOVENOX) injection  40 mg Subcutaneous Q24H  . fluticasone  1 spray Each Nare BID  . insulin aspart  0-12 Units Subcutaneous TID PC & HS  . prenatal multivitamin  1 tablet Oral Q1200  . sodium chloride  1 spray Each Nare TID  . sodium chloride flush  3 mL Intravenous Q12H  . zinc sulfate  220 mg Oral Daily   Continuous Infusions: . sodium chloride Stopped (03/27/20 2035)  .  remdesivir 100 mg in NS 100 mL 100 mg (03/30/20 1006)   PRN Meds:.sodium chloride, acetaminophen, calcium carbonate, docusate sodium, [DISCONTINUED] ondansetron **OR** ondansetron (ZOFRAN) IV, sodium chloride flush  Antibiotics  :    Anti-infectives (From admission, onward)   Start     Dose/Rate Route Frequency Ordered Stop   03/28/20 1000  remdesivir 100 mg in sodium chloride 0.9 % 100 mL IVPB       "Followed by" Linked Group Details   100 mg 200 mL/hr over 30 Minutes Intravenous Daily 03/27/20 1651 04/01/20 0959   03/27/20 1800  remdesivir 200 mg in sodium chloride 0.9% 250 mL IVPB       "Followed by" Linked Group Details   200 mg 580 mL/hr over 30 Minutes Intravenous Once 03/27/20 1651 03/27/20 2034       Time Spent in minutes  30   Susa Raring M.D on 03/31/2020 at 8:38 AM  To page go to [email or web address]   [facility] -  Office  [phone]  See all Orders from today for further details    Objective:   Vitals:   03/30/20 2345 03/31/20 0358 03/31/20 0359 03/31/20 0812  BP: 119/74  119/70 111/65  Pulse: 66  66 (!) 53  Resp: (!) 22  20 20   Temp: 97.9 F (36.6 C) 97.8 F (36.6 C)  98.1 F (36.7 C)  TempSrc: Oral Oral  Oral  SpO2: 98%  98% 98%  Weight:      Height:        Wt Readings from Last 3 Encounters:  03/27/20 75.8 kg  03/23/20 75.8 kg  03/03/20 76.2 kg    No intake or output data in the 24 hours ending 03/31/20 0838   Physical Exam  Awake Alert, No new F.N deficits, Normal affect [name].AT,PERRAL Supple Neck,No JVD, No cervical lymphadenopathy appriciated.  Symmetrical Chest wall movement, Good air movement bilaterally, CTAB RRR,No Gallops, Rubs or new Murmurs, No Parasternal Heave +ve B.Sounds, visibly pregnant abdomen nontender, No Cyanosis, Clubbing or edema, No new Rash or bruise    Data Review:    CBC Recent Labs  Lab 03/27/20 0112 03/28/20 0749 03/29/20 0736 03/30/20 0651 03/31/20 0620  WBC 5.2 3.5* 3.9* 5.4 6.1  HGB  13.6 13.8 13.6 14.0 13.4  HCT 39.9 40.0 41.0 41.8 40.5  PLT 112* 127* 144* 169 169  MCV 88.7 87.7 88.7 88.4 88.0  MCH 30.2 30.3 29.4 29.6 29.1  MCHC 34.1 34.5 33.2 33.5 33.1  RDW 12.9 13.0 12.9 12.8 12.9  LYMPHSABS  --  1.1 1.4 1.4 1.4  MONOABS  --  0.3 0.3 0.4 0.4  EOSABS  --  0.0 0.0 0.0 0.0  BASOSABS  --  0.0 0.0 0.0 0.0    Recent Labs  Lab 03/27/20 0112 03/27/20 1718 03/28/20 0749 03/29/20 0736 03/30/20 0651 03/31/20 0620  NA 134*  --  134* 138 138 135  K 4.2  --  3.7 3.7 3.8 3.8  CL 107  --  107 110 110 107  CO2 16*  --  14* 16* 18* 17*  GLUCOSE 86  --  89 84 88 93  BUN 9  --  7 10 8 8   CREATININE 0.61  --  0.53 0.56 0.55 0.55  CALCIUM 8.2*  --  8.0* 8.1* 8.3* 8.4*  AST  --  49* 40 40 33 28  ALT  --  40 37 36 33 29  ALKPHOS  --  96 105 108 108 102  BILITOT  --  0.6 0.5 0.3 0.2* 0.4  ALBUMIN  --  2.6* 2.4* 2.4* 2.4* 2.4*  MG  --   --  2.0 2.0 1.9 1.8  CRP  --  5.0* 4.1* 1.9* 0.9 0.7  DDIMER  --  0.92* 0.55* 0.48 0.54* 0.48  PROCALCITON  --  0.45 0.34 0.25 0.20  --   BNP  --  20.3 24.2 46.6 51.8 83.2    ------------------------------------------------------------------------------------------------------------------ No results for input(s): CHOL, HDL, LDLCALC, TRIG, CHOLHDL, LDLDIRECT in the last 72 hours.  Lab Results  Component Value Date   HGBA1C 5.2 02/19/2020   ------------------------------------------------------------------------------------------------------------------ No results for input(s): TSH, T4TOTAL, T3FREE, THYROIDAB in the last 72 hours.  Invalid input(s): FREET3  Cardiac Enzymes No results for input(s): CKMB, TROPONINI, MYOGLOBIN in the last 168 hours.  Invalid input(s): CK ------------------------------------------------------------------------------------------------------------------    Component Value Date/Time   BNP 83.2 03/31/2020 0620    Micro Results Recent Results (from the past 240 hour(s))  Resp Panel by RT-PCR (Flu  A&B, Covid) Nasopharyngeal  Swab     Status: Abnormal   Collection Time: 03/27/20  1:14 AM   Specimen: Nasopharyngeal Swab; Nasopharyngeal(NP) swabs in vial transport medium  Result Value Ref Range Status   SARS Coronavirus 2 by RT PCR POSITIVE (A) NEGATIVE Final    Comment: RESULT CALLED TO, READ BACK BY AND VERIFIED WITH: RN FLORES MELANIE AT 0237 BY MESSAN H. ON 03/27/2020 (NOTE) SARS-CoV-2 target nucleic acids are DETECTED.  The SARS-CoV-2 RNA is generally detectable in upper respiratory specimens during the acute phase of infection. Positive results are indicative of the presence of the identified virus, but do not rule out bacterial infection or co-infection with other pathogens not detected by the test. Clinical correlation with patient history and other diagnostic information is necessary to determine patient infection status. The expected result is Negative.  Fact Sheet for Patients: BloggerCourse.com  Fact Sheet for Healthcare Providers: SeriousBroker.it  This test is not yet approved or cleared by the [place] FDA and  has been authorized for detection and/or diagnosis of SARS-CoV-2 by FDA under an Emergency Use Authorization (EUA).  This EUA will remain in effect (meaning  this test can be used) for the duration of  the COVID-19 declaration under Section 564(b)(1) of the Act, 21 U.S.C. section 360bbb-3(b)(1), unless the authorization is terminated or revoked sooner.     Influenza A by PCR NEGATIVE NEGATIVE Final   Influenza B by PCR NEGATIVE NEGATIVE Final    Comment: (NOTE) The Xpert Xpress SARS-CoV-2/FLU/RSV plus assay is intended as an aid in the diagnosis of influenza from Nasopharyngeal swab specimens and should not be used as a sole basis for treatment. Nasal washings and aspirates are unacceptable for Xpert Xpress SARS-CoV-2/FLU/RSV testing.  Fact Sheet for  Patients: BloggerCourse.com  Fact Sheet for Healthcare Providers: SeriousBroker.it  This test is not yet approved or cleared by the [place] FDA and has been authorized for detection and/or diagnosis of SARS-CoV-2 by FDA under an Emergency Use Authorization (EUA). This EUA will remain in effect (meaning this test can be used) for the duration of the COVID-19 declaration under Section 564(b)(1) of the Act, 21 U.S.C. section 360bbb-3(b)(1), unless the authorization is terminated or revoked.  Performed at [facility], [address] [place], [place] [address]     Radiology Reports DG Chest Portable 1 View  Result Date: 03/27/2020 CLINICAL DATA:  Shortness of breath. EXAM: PORTABLE CHEST 1 VIEW COMPARISON:  None. FINDINGS: Lung volumes are low. Bronchovascular crowding related to low lung volumes. There may be underlying peribronchial thickening. Upper normal heart size likely accentuated by AP technique. Normal mediastinal contours. No confluent consolidation. No pleural fluid. No pneumothorax. No acute osseous abnormalities are seen. IMPRESSION: 1. Low lung volumes with bronchovascular crowding. Possible underlying peribronchial thickening. 2. Upper normal heart size likely accentuated by AP technique. Electronically Signed   By: Narda Rutherford M.D.   On: 03/27/2020 01:40   [place] MFM OB FOLLOW UP  Result Date: 03/14/2020 ----------------------------------------------------------------------  OBSTETRICS REPORT                       (Signed Final 03/14/2020 09:26 am) ---------------------------------------------------------------------- Patient Info  ID #:       [id number]                          D.O.B.:  1981-03-08 (39 yrs)  Name:       Tanja Port  Visit Date: 03/14/2020 09:00 am ---------------------------------------------------------------------- Performed By  Attending:        Noralee Space MD        Ref.  Address:     [address]                                                             [place], [place]                                                             [address]  Performed By:     Jenel Lucks     [facility]  Referred By:      [facility] ---------------------------------------------------------------------- Orders  #  Description                           Code        Ordered By  1  [place] MFM OB FOLLOW UP                   [phone]    Noralee Space ----------------------------------------------------------------------  #  Order #                     Accession #                Episode #  1  [id number]                   [id number]                 [id number] ---------------------------------------------------------------------- Indications  [redacted] weeks gestation of pregnancy                Z3A.29  Gestational diabetes in pregnancy,             O24.415  controlled by oral hypoglycemic drugs  (metformin)  Advanced maternal age multigravida 6435+,        32O09.523  third trimester (39yo)  Poor obstetric history: Hx of preterm          O09.219  delivery, currently pregnant  Poor obstetrical history: 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  Obesity complicating pregnancy, third          O99.213  trimester (BMI 30) ---------------------------------------------------------------------- Fetal Evaluation  Num Of Fetuses:         1  Fetal Heart Rate(bpm):  126  Cardiac Activity:       Observed  Presentation:           Variable  Placenta:               Posterior  Amniotic Fluid  AFI FV:      Within normal limits  AFI Sum(cm)     %Tile       Largest Pocket(cm)  12.58           34          6.61  RUQ(cm)       RLQ(cm)       LUQ(cm)        LLQ(cm)  6.61           1.83          0              4.14 ---------------------------------------------------------------------- Biometry  BPD:      78.4  mm     G. Age:  31w 3d         87  %    CI:        75.83   %    70 - 86                                                          FL/HC:      19.8   %    19.2 - 21.4  HC:      285.4  mm     G. Age:  31w 2d         64  %    HC/AC:      1.05        0.99 - 1.21  AC:      272.3  mm     G. Age:  31w 2d         87  %    FL/BPD:     72.1   %    71 - 87  FL:       56.5  mm     G. Age:  29w 5d         34  %    FL/AC:      20.7   %    20 - 24  LV:        4.5  mm  Est. FW:    1645  gm    3 lb 10 oz      77  % ---------------------------------------------------------------------- OB History  Gravidity:    9         Term:   3  Living:       3 ---------------------------------------------------------------------- Gestational Age  LMP:           32w 2d        Date:  08/01/19                 EDD:   05/07/20  U/S Today:     31w 0d                                        EDD:   05/16/20  Best:          29w 5d     Det. ByMarcella Dubs:  Early Ultrasound         EDD:   05/25/20                                      (01/11/20) ---------------------------------------------------------------------- Anatomy  Cranium:               Appears normal         LVOT:                   Appears normal  Cavum:                 Previously seen        Aortic Arch:            Appears normal  Ventricles:            Appears normal         Ductal Arch:            Appears normal  Choroid Plexus:        Previously seen        Diaphragm:              Appears normal  Cerebellum:            Previously seen        Stomach:                Appears normal, left                                                                        sided  Posterior Fossa:       Previously seen        Abdomen:                Appears normal  Nuchal Fold:           Not applicable (>20    Abdominal Wall:         Previously seen                         wks GA)  Face:                   Orbits and profile     Cord Vessels:           Appears normal (3  previously seen                                vessel cord)  Lips:                  Appears normal         Kidneys:                Appear normal  Palate:                Appears normal         Bladder:                Appears normal  Thoracic:              Appears normal         Spine:                  Limited views of                                                                        cspine  Heart:                 Appears normal         Upper Extremities:      Limited views                         (4CH, axis, and                         situs)  RVOT:                  Appears normal         Lower Extremities:      Previously seen  Other:  Unable to obtain images of cspine and right ulna/radius and hand,          due to fetal position ---------------------------------------------------------------------- Cervix Uterus Adnexa  Cervix  Length:           4.86  cm.  Normal appearance by transabdominal scan.  Uterus  No abnormality visualized.  Right Ovary  Within normal limits.  Left Ovary  Within normal limits. ---------------------------------------------------------------------- Impression  Gestational diabetes.  Patient takes Metformin for control.  Blood pressure today at her office is 116/71 mmHg.  Fetal growth is appropriate for gestational age .Amniotic fluid  is normal and good fetal activity is seen .  Fetal anatomical  survey was completed and appeared normal (limited views of  upper extremity). ---------------------------------------------------------------------- Recommendations  BPP in 3 weeks and then weekly till delivery. ----------------------------------------------------------------------                  Noralee Space, MD Electronically Signed Final Report   03/14/2020 09:26 am ----------------------------------------------------------------------

## 2020-03-31 NOTE — Discharge Instructions (Signed)
COVID-19: Diferencia entre cuarentena y aislamiento COVID-19 Quarantine vs. Isolation [place] obliga a la persona que ha [employer] en contacto estrecho con alguien que tiene COVID-19 a mantenerse alejada de los dems. Haga cuarentena si ha estado en contacto estrecho con alguien que tiene COVID-19, a menos que tenga la vacunacin [place]. Si tiene la vacunacin completa  NO necesita hacer cuarentena a menos que tenga sntomas  Hgase la prueba entre 3 y 5 das despus de su exposicin, incluso si no tiene sntomas  Use Primary school teacher en espacios pblicos interiores durante 14 das despus de la exposicin o Teacher, adult education que el resultado de la prueba sea negativo Si no tiene la vacunacin Barista en casa durante 14 das despus del ltimo contacto con una persona que tiene COVID-19  Preste atencin a si tiene fiebre (100.98F), tos, dificultad para respirar u otros sntomas de COVID-19  Si es posible, mantngase lejos de las personas con las que vive, sobre todo si tienen un riesgo mayor de enfermarse gravemente de COVID-19  Pngase en contacto con el departamento local de salud pblica para Solicitor las opciones en su rea para posiblemente acortar su cuarentena [place] obliga a una persona enferma o que obtuvo resultado positivo de la prueba de COVID-19, aunque no presente sntomas, a Microbiologist de Economist, incluso en Hotel manager. Las personas que estn aisladas deben quedarse en casa y Personal assistant en una "habitacin de enfermo" o zona especfica y usar un bao separado (si est disponible). Si est enfermo y piensa o sabe que tiene COVID-19 [employer] en su casa hasta despus  De que hayan pasado al menos 10 das desde que tuvo sntomas por primera vez y  [place] haya pasado al menos 24 horas sin fiebre sin tomar medicamentos para Personal assistant fiebre y  [place] que los sntomas hayan mejorado Si obtuvo resultado positivo en la prueba de COVID-19 pero no presenta  sntomas  Qudese en casa hasta que hayan pasado 10 das desde que obtuvo resultado positivo en la prueba viral  Si presenta sntomas despus de un resultado positivo, siga los pasos anteriores para las personas enfermas [email or web address] 11/19/2018 Esta informacin no tiene Theme park manager el consejo del mdico. Asegrese de hacerle al mdico cualquier pregunta que tenga. Document Revised: 01/18/2020 Document Reviewed: 01/18/2020 Elsevier Patient Education  2021 Elsevier Inc.  10 cosas que puede hacer para controlar los sntomas de COVID-19 en casa 10 Things You Can Do to Manage Your COVID-19 Symptoms at Home Si tiene la infeccin por COVID-19 posible o confirmada: 1. Qudese en su casa, excepto para obtener atencin mdica. 2. Preste atencin a sus sntomas cuidadosamente. Si sus sntomas empeoran, llame al mdico de inmediato. 3. Descanse y mantngase hidratado. 4. Si tiene una cita mdica, llame al mdico con anticipacin e infrmele que tiene o puede tener COVID-19. 5. Si tiene una emergencia mdica, llame al 911 y avise al personal de despacho que tiene o puede tener COVID-19. 6. Al toser y al estornudar, cbrase la boca y la nariz con un pauelo descartable o con el pliegue del codo. 7. Lvese las manos con frecuencia con agua y jabn durante al menos 20 segundos, o bien lmpiese las manos con un desinfectante de manos a base de alcohol que contenga al menos un 60% de alcohol. 8. En la mayor medida posible, permanezca en una habitacin especfica y lejos de Music therapist. Adems, debe utilizar un bao aparte, si es posible. Si necesita estar cerca de [place]  personas dentro o fuera de la casa, use Primary school teacher. 9. Evite compartir objetos personales con [employer] de la casa, como platos, toallas y ropa de [place]. 10. Limpie todas las superficies que se tocan con frecuencia, como encimeras, mesas y picaportes. Utilice los [employer] o las toallitas hmedas de  limpieza domstica segn las instrucciones de la etiqueta. [email or web address] 09/17/2018 Esta informacin no tiene Theme park manager el consejo del mdico. Asegrese de hacerle al mdico cualquier pregunta que tenga. Document Revised: 01/18/2020 Document Reviewed: 01/18/2020 Elsevier Patient Education  2021 [employer].

## 2020-03-31 NOTE — Progress Notes (Signed)
Ambulated w/ pt around room and to bathroom w/ portable pulse ox. Pt's Sp02 remained at 97% on RA w/out labored breathing or discomfort.

## 2020-03-31 NOTE — Progress Notes (Signed)
Video Interpreters used three times this morning to review plan of care, update about MD roundings, and complete discharge instructions.  Patient discharged home with family, condition stable.  Pt to car via wheelchair with T. Spears, NT.  Patient home with IS and flutter valve.  No other equipment for home ordered at discharge.

## 2020-03-31 NOTE — Discharge Summary (Signed)
Antenatal Physician Discharge Summary  Patient ID: Aimee Benjamin MRN: [id number] DOB/AGE: 41-04-1980 40 y.o.  Admit date: 03/26/2020 Discharge date: 03/31/2020  Admission Diagnoses: COVID 19 infection  Discharge Diagnoses:  Active Problems:   Multigravida of advanced maternal age in third trimester   Gestational diabetes mellitus (GDM), antepartum   BMI 32.0-32.9,adult   Language barrier   COVID-19 affecting pregnancy in third trimester   Gestational thrombocytopenia (HCC)   Prenatal Procedures: NST  Consults: [facility] hospitalist  Hospital Course:  This is a 40 y.o. [id number] with IUP at [redacted]w[redacted]d admitted for shortness of breath in the setting of pregnancy and COVID infection. She was started on IV steroids and remdesivir and slowly improved throughout her course. She never required supplemental O2 ut was started on an albuterol inhaler. She was discharged to home in good condition with no remaining shortness of breath on HD#5. Fetal status reassuring and patient denies contractions, leaking, bleeding, reports normal fetal movement.   She was deemed stable for discharge to home with outpatient follow up.  Discharge Exam: Temp:  [97.8 F (36.6 C)-98.3 F (36.8 C)] 98.1 F (36.7 C) (01/13 0812) Pulse Rate:  [53-66] 53 (01/13 0812) Resp:  [20-30] 20 (01/13 0812) BP: (111-127)/(65-74) 111/65 (01/13 0812) SpO2:  [98 %-99 %] 98 % (01/13 1478) Physical Examination: CONSTITUTIONAL: Well-developed, well-nourished female in no acute distress.  HENT:  Normocephalic, atraumatic, External right and left ear normal. Oropharynx is clear and moist EYES: Conjunctivae and EOM are normal. Pupils are equal, round, and reactive to light. No scleral icterus.  NECK: Normal range of motion, supple, no masses SKIN: Skin is warm and dry. No rash noted. Not diaphoretic. No erythema. No pallor. NEUROLGIC: Alert and oriented to person, place, and time. Normal reflexes, muscle tone coordination. No cranial nerve  deficit noted. PSYCHIATRIC: Normal mood and affect. Normal behavior. Normal judgment and thought content. CARDIOVASCULAR: Normal heart rate noted RESPIRATORY: Effort normal, no problems with respiration noted MUSCULOSKELETAL: Normal range of motion. No edema and no tenderness. 2+ distal pulses. ABDOMEN: Soft, nontender, nondistended, gravid. CERVIX:   deferred  Fetal monitoring: FHR: 140 bpm, Variability: moderate, Accelerations: Present, Decelerations: Absent  Uterine activity: 1 contractions per hour  Significant Diagnostic Studies:  Results for orders placed or performed during the hospital encounter of 03/26/20 (from the past 168 hour(s))  Basic metabolic panel   Collection Time: 03/27/20  1:12 AM  Result Value Ref Range   Sodium 134 (L) 135 - 145 mmol/L   Potassium 4.2 3.5 - 5.1 mmol/L   Chloride 107 98 - 111 mmol/L   CO2 16 (L) 22 - 32 mmol/L   Glucose, Bld 86 70 - 99 mg/dL   BUN 9 6 - 20 mg/dL   Creatinine, Ser 2.95 0.44 - 1.00 mg/dL   Calcium 8.2 (L) 8.9 - 10.3 mg/dL   GFR, Estimated >62 >13 mL/min   Anion gap 11 5 - 15  CBC   Collection Time: 03/27/20  1:12 AM  Result Value Ref Range   WBC 5.2 4.0 - 10.5 K/uL   RBC 4.50 3.87 - 5.11 MIL/uL   Hemoglobin 13.6 12.0 - 15.0 g/dL   HCT 08.6 57.8 - 46.9 %   MCV 88.7 80.0 - 100.0 fL   MCH 30.2 26.0 - 34.0 pg   MCHC 34.1 30.0 - 36.0 g/dL   RDW 62.9 52.8 - 41.3 %   Platelets 112 (L) 150 - 400 K/uL   nRBC 0.0 0.0 - 0.2 %  Troponin I (High Sensitivity)  Collection Time: 03/27/20  1:12 AM  Result Value Ref Range   Troponin I (High Sensitivity) 3 <18 ng/L  Resp Panel by RT-PCR (Flu A&B, Covid) Nasopharyngeal Swab   Collection Time: 03/27/20  1:14 AM   Specimen: Nasopharyngeal Swab; Nasopharyngeal(NP) swabs in vial transport medium  Result Value Ref Range   SARS Coronavirus 2 by RT PCR POSITIVE (A) NEGATIVE   Influenza A by PCR NEGATIVE NEGATIVE   Influenza B by PCR NEGATIVE NEGATIVE  I-Stat beta hCG blood, ED    Collection Time: 03/27/20  1:25 AM  Result Value Ref Range   I-stat hCG, quantitative >2,000.0 (H) <5 mIU/mL   Comment 3          Troponin I (High Sensitivity)   Collection Time: 03/27/20  4:30 AM  Result Value Ref Range   Troponin I (High Sensitivity) 3 <18 ng/L  C-reactive protein   Collection Time: 03/27/20  5:18 PM  Result Value Ref Range   CRP 5.0 (H) <1.0 mg/dL  Brain natriuretic peptide   Collection Time: 03/27/20  5:18 PM  Result Value Ref Range   B Natriuretic Peptide 20.3 0.0 - 100.0 pg/mL  D-dimer, quantitative (not at [facility])   Collection Time: 03/27/20  5:18 PM  Result Value Ref Range   D-Dimer, Quant 0.92 (H) 0.00 - 0.50 ug/mL-FEU  Ferritin   Collection Time: 03/27/20  5:18 PM  Result Value Ref Range   Ferritin 87 11 - 307 ng/mL  Fibrinogen   Collection Time: 03/27/20  5:18 PM  Result Value Ref Range   Fibrinogen 469 210 - 475 mg/dL  Lactate dehydrogenase   Collection Time: 03/27/20  5:18 PM  Result Value Ref Range   LDH 188 98 - 192 U/L  Procalcitonin   Collection Time: 03/27/20  5:18 PM  Result Value Ref Range   Procalcitonin 0.45 ng/mL  Hepatic function panel   Collection Time: 03/27/20  5:18 PM  Result Value Ref Range   Total Protein 6.2 (L) 6.5 - 8.1 g/dL   Albumin 2.6 (L) 3.5 - 5.0 g/dL   AST 49 (H) 15 - 41 U/L   ALT 40 0 - 44 U/L   Alkaline Phosphatase 96 38 - 126 U/L   Total Bilirubin 0.6 0.3 - 1.2 mg/dL   Bilirubin, Direct 0.2 0.0 - 0.2 mg/dL   Indirect Bilirubin 0.4 0.3 - 0.9 mg/dL  Type and screen [facility]   Collection Time: 03/27/20  5:18 PM  Result Value Ref Range   ABO/RH(D) O POS    Antibody Screen NEG    Sample Expiration      03/30/2020,2359 Performed at [facility], [address] [place], [place] [address]   Glucose, capillary   Collection Time: 03/27/20  6:00 PM  Result Value Ref Range   Glucose-Capillary 77 70 - 99 mg/dL  Glucose, capillary   Collection Time: 03/27/20  9:58 PM  Result Value Ref  Range   Glucose-Capillary 149 (H) 70 - 99 mg/dL  CBC with Differential/Platelet   Collection Time: 03/28/20  7:49 AM  Result Value Ref Range   WBC 3.5 (L) 4.0 - 10.5 K/uL   RBC 4.56 3.87 - 5.11 MIL/uL   Hemoglobin 13.8 12.0 - 15.0 g/dL   HCT 60.440.0 54.036.0 - 98.146.0 %   MCV 87.7 80.0 - 100.0 fL   MCH 30.3 26.0 - 34.0 pg   MCHC 34.5 30.0 - 36.0 g/dL   RDW 19.113.0 47.811.5 - 29.515.5 %   Platelets 127 (  L) 150 - 400 K/uL   nRBC 0.0 0.0 - 0.2 %   Neutrophils Relative % 58 %   Neutro Abs 2.0 1.7 - 7.7 K/uL   Lymphocytes Relative 33 %   Lymphs Abs 1.1 0.7 - 4.0 K/uL   Monocytes Relative 8 %   Monocytes Absolute 0.3 0.1 - 1.0 K/uL   Eosinophils Relative 0 %   Eosinophils Absolute 0.0 0.0 - 0.5 K/uL   Basophils Relative 0 %   Basophils Absolute 0.0 0.0 - 0.1 K/uL   Immature Granulocytes 1 %   Abs Immature Granulocytes 0.04 0.00 - 0.07 K/uL  Comprehensive metabolic panel   Collection Time: 03/28/20  7:49 AM  Result Value Ref Range   Sodium 134 (L) 135 - 145 mmol/L   Potassium 3.7 3.5 - 5.1 mmol/L   Chloride 107 98 - 111 mmol/L   CO2 14 (L) 22 - 32 mmol/L   Glucose, Bld 89 70 - 99 mg/dL   BUN 7 6 - 20 mg/dL   Creatinine, Ser 0.980.53 0.44 - 1.00 mg/dL   Calcium 8.0 (L) 8.9 - 10.3 mg/dL   Total Protein 6.1 (L) 6.5 - 8.1 g/dL   Albumin 2.4 (L) 3.5 - 5.0 g/dL   AST 40 15 - 41 U/L   ALT 37 0 - 44 U/L   Alkaline Phosphatase 105 38 - 126 U/L   Total Bilirubin 0.5 0.3 - 1.2 mg/dL   GFR, Estimated >11>60 >91>60 mL/min   Anion gap 13 5 - 15  C-reactive protein   Collection Time: 03/28/20  7:49 AM  Result Value Ref Range   CRP 4.1 (H) <1.0 mg/dL  D-dimer, quantitative (not at [facility])   Collection Time: 03/28/20  7:49 AM  Result Value Ref Range   D-Dimer, Quant 0.55 (H) 0.00 - 0.50 ug/mL-FEU  Magnesium   Collection Time: 03/28/20  7:49 AM  Result Value Ref Range   Magnesium 2.0 1.7 - 2.4 mg/dL  Brain natriuretic peptide   Collection Time: 03/28/20  7:49 AM  Result Value Ref Range   B Natriuretic Peptide  24.2 0.0 - 100.0 pg/mL  Procalcitonin - Baseline   Collection Time: 03/28/20  7:49 AM  Result Value Ref Range   Procalcitonin 0.34 ng/mL  Glucose, capillary   Collection Time: 03/28/20 11:32 AM  Result Value Ref Range   Glucose-Capillary 99 70 - 99 mg/dL  Glucose, capillary   Collection Time: 03/28/20  3:59 PM  Result Value Ref Range   Glucose-Capillary 111 (H) 70 - 99 mg/dL  Glucose, capillary   Collection Time: 03/28/20  9:05 PM  Result Value Ref Range   Glucose-Capillary 200 (H) 70 - 99 mg/dL  Glucose, capillary   Collection Time: 03/29/20  7:27 AM  Result Value Ref Range   Glucose-Capillary 85 70 - 99 mg/dL  CBC with Differential/Platelet   Collection Time: 03/29/20  7:36 AM  Result Value Ref Range   WBC 3.9 (L) 4.0 - 10.5 K/uL   RBC 4.62 3.87 - 5.11 MIL/uL   Hemoglobin 13.6 12.0 - 15.0 g/dL   HCT 47.841.0 29.536.0 - 62.146.0 %   MCV 88.7 80.0 - 100.0 fL   MCH 29.4 26.0 - 34.0 pg   MCHC 33.2 30.0 - 36.0 g/dL   RDW 30.812.9 65.711.5 - 84.615.5 %   Platelets 144 (L) 150 - 400 K/uL   nRBC 0.0 0.0 - 0.2 %   Neutrophils Relative % 57 %   Neutro Abs 2.2 1.7 - 7.7 K/uL   Lymphocytes Relative  35 %   Lymphs Abs 1.4 0.7 - 4.0 K/uL   Monocytes Relative 7 %   Monocytes Absolute 0.3 0.1 - 1.0 K/uL   Eosinophils Relative 0 %   Eosinophils Absolute 0.0 0.0 - 0.5 K/uL   Basophils Relative 0 %   Basophils Absolute 0.0 0.0 - 0.1 K/uL   Immature Granulocytes 1 %   Abs Immature Granulocytes 0.05 0.00 - 0.07 K/uL  Comprehensive metabolic panel   Collection Time: 03/29/20  7:36 AM  Result Value Ref Range   Sodium 138 135 - 145 mmol/L   Potassium 3.7 3.5 - 5.1 mmol/L   Chloride 110 98 - 111 mmol/L   CO2 16 (L) 22 - 32 mmol/L   Glucose, Bld 84 70 - 99 mg/dL   BUN 10 6 - 20 mg/dL   Creatinine, Ser 1.24 0.44 - 1.00 mg/dL   Calcium 8.1 (L) 8.9 - 10.3 mg/dL   Total Protein 6.1 (L) 6.5 - 8.1 g/dL   Albumin 2.4 (L) 3.5 - 5.0 g/dL   AST 40 15 - 41 U/L   ALT 36 0 - 44 U/L   Alkaline Phosphatase 108 38 - 126  U/L   Total Bilirubin 0.3 0.3 - 1.2 mg/dL   GFR, Estimated >58 >09 mL/min   Anion gap 12 5 - 15  C-reactive protein   Collection Time: 03/29/20  7:36 AM  Result Value Ref Range   CRP 1.9 (H) <1.0 mg/dL  D-dimer, quantitative (not at [facility])   Collection Time: 03/29/20  7:36 AM  Result Value Ref Range   D-Dimer, Quant 0.48 0.00 - 0.50 ug/mL-FEU  Magnesium   Collection Time: 03/29/20  7:36 AM  Result Value Ref Range   Magnesium 2.0 1.7 - 2.4 mg/dL  Brain natriuretic peptide   Collection Time: 03/29/20  7:36 AM  Result Value Ref Range   B Natriuretic Peptide 46.6 0.0 - 100.0 pg/mL  Procalcitonin   Collection Time: 03/29/20  7:36 AM  Result Value Ref Range   Procalcitonin 0.25 ng/mL  Glucose, capillary   Collection Time: 03/29/20  9:26 AM  Result Value Ref Range   Glucose-Capillary 110 (H) 70 - 99 mg/dL  Glucose, capillary   Collection Time: 03/29/20  2:28 PM  Result Value Ref Range   Glucose-Capillary 104 (H) 70 - 99 mg/dL  Glucose, capillary   Collection Time: 03/29/20 10:49 PM  Result Value Ref Range   Glucose-Capillary 254 (H) 70 - 99 mg/dL  Glucose, capillary   Collection Time: 03/30/20  6:31 AM  Result Value Ref Range   Glucose-Capillary 59 (L) 70 - 99 mg/dL  CBC with Differential/Platelet   Collection Time: 03/30/20  6:51 AM  Result Value Ref Range   WBC 5.4 4.0 - 10.5 K/uL   RBC 4.73 3.87 - 5.11 MIL/uL   Hemoglobin 14.0 12.0 - 15.0 g/dL   HCT 98.3 38.2 - 50.5 %   MCV 88.4 80.0 - 100.0 fL   MCH 29.6 26.0 - 34.0 pg   MCHC 33.5 30.0 - 36.0 g/dL   RDW 39.7 67.3 - 41.9 %   Platelets 169 150 - 400 K/uL   nRBC 0.0 0.0 - 0.2 %   Neutrophils Relative % 65 %   Neutro Abs 3.5 1.7 - 7.7 K/uL   Lymphocytes Relative 26 %   Lymphs Abs 1.4 0.7 - 4.0 K/uL   Monocytes Relative 7 %   Monocytes Absolute 0.4 0.1 - 1.0 K/uL   Eosinophils Relative 0 %   Eosinophils Absolute 0.0  0.0 - 0.5 K/uL   Basophils Relative 0 %   Basophils Absolute 0.0 0.0 - 0.1 K/uL   Immature  Granulocytes 2 %   Abs Immature Granulocytes 0.08 (H) 0.00 - 0.07 K/uL  Comprehensive metabolic panel   Collection Time: 03/30/20  6:51 AM  Result Value Ref Range   Sodium 138 135 - 145 mmol/L   Potassium 3.8 3.5 - 5.1 mmol/L   Chloride 110 98 - 111 mmol/L   CO2 18 (L) 22 - 32 mmol/L   Glucose, Bld 88 70 - 99 mg/dL   BUN 8 6 - 20 mg/dL   Creatinine, Ser 6.81 0.44 - 1.00 mg/dL   Calcium 8.3 (L) 8.9 - 10.3 mg/dL   Total Protein 6.5 6.5 - 8.1 g/dL   Albumin 2.4 (L) 3.5 - 5.0 g/dL   AST 33 15 - 41 U/L   ALT 33 0 - 44 U/L   Alkaline Phosphatase 108 38 - 126 U/L   Total Bilirubin 0.2 (L) 0.3 - 1.2 mg/dL   GFR, Estimated >15 >72 mL/min   Anion gap 10 5 - 15  C-reactive protein   Collection Time: 03/30/20  6:51 AM  Result Value Ref Range   CRP 0.9 <1.0 mg/dL  D-dimer, quantitative (not at [facility])   Collection Time: 03/30/20  6:51 AM  Result Value Ref Range   D-Dimer, Quant 0.54 (H) 0.00 - 0.50 ug/mL-FEU  Magnesium   Collection Time: 03/30/20  6:51 AM  Result Value Ref Range   Magnesium 1.9 1.7 - 2.4 mg/dL  Brain natriuretic peptide   Collection Time: 03/30/20  6:51 AM  Result Value Ref Range   B Natriuretic Peptide 51.8 0.0 - 100.0 pg/mL  Procalcitonin   Collection Time: 03/30/20  6:51 AM  Result Value Ref Range   Procalcitonin 0.20 ng/mL  Glucose, capillary   Collection Time: 03/30/20  6:58 AM  Result Value Ref Range   Glucose-Capillary 112 (H) 70 - 99 mg/dL  Glucose, capillary   Collection Time: 03/30/20 11:17 AM  Result Value Ref Range   Glucose-Capillary 96 70 - 99 mg/dL  Glucose, capillary   Collection Time: 03/30/20  4:05 PM  Result Value Ref Range   Glucose-Capillary 109 (H) 70 - 99 mg/dL  Glucose, capillary   Collection Time: 03/30/20  8:23 PM  Result Value Ref Range   Glucose-Capillary 129 (H) 70 - 99 mg/dL  Glucose, capillary   Collection Time: 03/30/20 10:03 PM  Result Value Ref Range   Glucose-Capillary 122 (H) 70 - 99 mg/dL  Glucose, capillary    Collection Time: 03/31/20  5:59 AM  Result Value Ref Range   Glucose-Capillary 91 70 - 99 mg/dL  CBC with Differential/Platelet   Collection Time: 03/31/20  6:20 AM  Result Value Ref Range   WBC 6.1 4.0 - 10.5 K/uL   RBC 4.60 3.87 - 5.11 MIL/uL   Hemoglobin 13.4 12.0 - 15.0 g/dL   HCT 62.0 35.5 - 97.4 %   MCV 88.0 80.0 - 100.0 fL   MCH 29.1 26.0 - 34.0 pg   MCHC 33.1 30.0 - 36.0 g/dL   RDW 16.3 84.5 - 36.4 %   Platelets 169 150 - 400 K/uL   nRBC 0.3 (H) 0.0 - 0.2 %   Neutrophils Relative % 70 %   Neutro Abs 4.3 1.7 - 7.7 K/uL   Lymphocytes Relative 23 %   Lymphs Abs 1.4 0.7 - 4.0 K/uL   Monocytes Relative 6 %   Monocytes Absolute 0.4 0.1 -  1.0 K/uL   Eosinophils Relative 0 %   Eosinophils Absolute 0.0 0.0 - 0.5 K/uL   Basophils Relative 0 %   Basophils Absolute 0.0 0.0 - 0.1 K/uL   Immature Granulocytes 1 %   Abs Immature Granulocytes 0.05 0.00 - 0.07 K/uL  Comprehensive metabolic panel   Collection Time: 03/31/20  6:20 AM  Result Value Ref Range   Sodium 135 135 - 145 mmol/L   Potassium 3.8 3.5 - 5.1 mmol/L   Chloride 107 98 - 111 mmol/L   CO2 17 (L) 22 - 32 mmol/L   Glucose, Bld 93 70 - 99 mg/dL   BUN 8 6 - 20 mg/dL   Creatinine, Ser 3.87 0.44 - 1.00 mg/dL   Calcium 8.4 (L) 8.9 - 10.3 mg/dL   Total Protein 5.9 (L) 6.5 - 8.1 g/dL   Albumin 2.4 (L) 3.5 - 5.0 g/dL   AST 28 15 - 41 U/L   ALT 29 0 - 44 U/L   Alkaline Phosphatase 102 38 - 126 U/L   Total Bilirubin 0.4 0.3 - 1.2 mg/dL   GFR, Estimated >56 >43 mL/min   Anion gap 11 5 - 15  C-reactive protein   Collection Time: 03/31/20  6:20 AM  Result Value Ref Range   CRP 0.7 <1.0 mg/dL  D-dimer, quantitative (not at [facility])   Collection Time: 03/31/20  6:20 AM  Result Value Ref Range   D-Dimer, Quant 0.48 0.00 - 0.50 ug/mL-FEU  Magnesium   Collection Time: 03/31/20  6:20 AM  Result Value Ref Range   Magnesium 1.8 1.7 - 2.4 mg/dL  Brain natriuretic peptide   Collection Time: 03/31/20  6:20 AM  Result Value Ref  Range   B Natriuretic Peptide 83.2 0.0 - 100.0 pg/mL  Procalcitonin   Collection Time: 03/31/20  6:20 AM  Result Value Ref Range   Procalcitonin 0.14 ng/mL  Glucose, capillary   Collection Time: 03/31/20 10:34 AM  Result Value Ref Range   Glucose-Capillary 87 70 - 99 mg/dL    Discharge Condition: Stable  Disposition: Discharge disposition: 01-Home or Self Care        Discharge Instructions    Notify physician for a general feeling that "something is not right"   Complete by: As directed    Notify physician for increase or change in vaginal discharge   Complete by: As directed    Notify physician for intestinal cramps, with or without diarrhea, sometimes described as "gas pain"   Complete by: As directed    Notify physician for leaking of fluid   Complete by: As directed    Notify physician for low, dull backache, unrelieved by heat or Tylenol   Complete by: As directed    Notify physician for menstrual like cramps   Complete by: As directed    Notify physician for pelvic pressure   Complete by: As directed    Notify physician for uterine contractions.  These may be painless and feel like the uterus is tightening or the baby is  "balling up"   Complete by: As directed    Notify physician for vaginal bleeding   Complete by: As directed    PRETERM LABOR:  Includes any of the follwing symptoms that occur between 20 - [redacted] weeks gestation.  If these symptoms are not stopped, preterm labor can result in preterm delivery, placing your baby at risk   Complete by: As directed      Allergies as of 03/31/2020      Reactions  Aspirin Shortness Of Breath, Anxiety   Pt is currently taking aspirin 81 with no problem (03/27/2020)   Penicillins Shortness Of Breath, Anxiety, Other (See Comments)   Per pts husband pt had no swelling reaction w/this medication just SOB.   Has patient had a PCN reaction causing immediate rash, facial/tongue/throat swelling, SOB or lightheadedness with  hypotension: Yes Has patient had a PCN reaction causing severe rash involving mucus membranes or skin necrosis: No Has patient had a PCN reaction that required hospitalization No Has patient had a PCN reaction occurring within the last 10 years: Yes If all of the above answers are "NO", then may proceed with Cepha      Medication List    TAKE these medications   acetaminophen 500 MG tablet Commonly known as: TYLENOL Take 500 mg by mouth every 6 (six) hours as needed for fever or headache (pain).   albuterol 108 (90 Base) MCG/ACT inhaler Commonly known as: VENTOLIN HFA Inhale 2 puffs into the lungs every 6 (six) hours.   aspirin EC 81 MG tablet Take 1 tablet (81 mg total) by mouth daily. Take after 12 weeks for prevention of preeclampsia later in pregnancy   metFORMIN 500 MG tablet Commonly known as: GLUCOPHAGE Take 1 tablet (500 mg total) by mouth daily with supper.   Prenatal Vitamins 28-0.8 MG Tabs Take 1 tablet by mouth daily.       Follow-up Information    [facility]. Go to.   Specialty: Obstetrics and Gynecology Why: as scheduled Contact information: [address] [place] [address] [phone]              Signed: Baldemar Lenis, MD, [facility] Attending Center for [facility] (Faculty Practice)  03/31/2020, 11:31 AM

## 2020-04-04 ENCOUNTER — Ambulatory Visit: Payer: Self-pay

## 2020-04-06 ENCOUNTER — Encounter: Payer: Self-pay | Admitting: *Deleted

## 2020-04-06 ENCOUNTER — Encounter: Payer: Self-pay | Admitting: Family Medicine

## 2020-04-06 ENCOUNTER — Ambulatory Visit: Payer: Self-pay | Attending: Obstetrics and Gynecology

## 2020-04-06 ENCOUNTER — Other Ambulatory Visit: Payer: Self-pay

## 2020-04-06 ENCOUNTER — Telehealth (INDEPENDENT_AMBULATORY_CARE_PROVIDER_SITE_OTHER): Payer: Self-pay | Admitting: Family Medicine

## 2020-04-06 ENCOUNTER — Ambulatory Visit: Payer: Self-pay | Admitting: *Deleted

## 2020-04-06 ENCOUNTER — Other Ambulatory Visit: Payer: Self-pay | Admitting: *Deleted

## 2020-04-06 DIAGNOSIS — O0990 Supervision of high risk pregnancy, unspecified, unspecified trimester: Secondary | ICD-10-CM | POA: Insufficient documentation

## 2020-04-06 DIAGNOSIS — E669 Obesity, unspecified: Secondary | ICD-10-CM

## 2020-04-06 DIAGNOSIS — Z789 Other specified health status: Secondary | ICD-10-CM

## 2020-04-06 DIAGNOSIS — O09299 Supervision of pregnancy with other poor reproductive or obstetric history, unspecified trimester: Secondary | ICD-10-CM

## 2020-04-06 DIAGNOSIS — O09293 Supervision of pregnancy with other poor reproductive or obstetric history, third trimester: Secondary | ICD-10-CM

## 2020-04-06 DIAGNOSIS — O99113 Other diseases of the blood and blood-forming organs and certain disorders involving the immune mechanism complicating pregnancy, third trimester: Secondary | ICD-10-CM

## 2020-04-06 DIAGNOSIS — O09523 Supervision of elderly multigravida, third trimester: Secondary | ICD-10-CM

## 2020-04-06 DIAGNOSIS — O09899 Supervision of other high risk pregnancies, unspecified trimester: Secondary | ICD-10-CM

## 2020-04-06 DIAGNOSIS — O24415 Gestational diabetes mellitus in pregnancy, controlled by oral hypoglycemic drugs: Secondary | ICD-10-CM

## 2020-04-06 DIAGNOSIS — O98513 Other viral diseases complicating pregnancy, third trimester: Secondary | ICD-10-CM

## 2020-04-06 DIAGNOSIS — D696 Thrombocytopenia, unspecified: Secondary | ICD-10-CM

## 2020-04-06 DIAGNOSIS — U071 COVID-19: Secondary | ICD-10-CM

## 2020-04-06 DIAGNOSIS — Z3A33 33 weeks gestation of pregnancy: Secondary | ICD-10-CM

## 2020-04-06 DIAGNOSIS — O99213 Obesity complicating pregnancy, third trimester: Secondary | ICD-10-CM

## 2020-04-06 DIAGNOSIS — O09213 Supervision of pregnancy with history of pre-term labor, third trimester: Secondary | ICD-10-CM

## 2020-04-06 NOTE — Progress Notes (Signed)
Patient contacted multiple times without success.

## 2020-04-06 NOTE — Patient Instructions (Signed)
 Eleccin del mtodo anticonceptivo Contraception Choices La anticoncepcin, o los mtodos anticonceptivos, hace referencia a los mtodos o dispositivos que evitan el embarazo. Mtodos hormonales Implante anticonceptivo Un implante anticonceptivo consiste en un tubo delgado de plstico que contiene una hormona que evita el embarazo. Es diferente de un dispositivo intrauterino (DIU). Un mdico lo inserta en la parte superior del brazo. Los implantes pueden ser eficaces durante un mximo de 3 aos. Inyecciones de progestina sola Las inyecciones de progestina sola contienen progestina, una forma sinttica de la hormona progesterona. Un mdico las administra cada 3 meses. Pldoras anticonceptivas Las pldoras anticonceptivas son pastillas que contienen hormonas que evitan el embarazo. Deben tomarse una vez al da, preferentemente a la misma hora cada da. Se necesita una receta para utilizar este mtodo anticonceptivo. Parche anticonceptivo El parche anticonceptivo contiene hormonas que evitan el embarazo. Se coloca en la piel, debe cambiarse una vez a la semana durante tres semanas y debe retirarse en la cuarta semana. Se necesita una receta para utilizar este mtodo anticonceptivo. Anillo vaginal Un anillo vaginal contiene hormonas que evitan el embarazo. Se coloca en la vagina durante tres semanas y se retira en la cuarta semana. Luego se repite el proceso con un anillo nuevo. Se necesita una receta para utilizar este mtodo anticonceptivo. Anticonceptivo de emergencia Los anticonceptivos de emergencia son mtodos para evitar un embarazo despus de tener sexo sin proteccin. Vienen en forma de pldora y pueden tomarse hasta 5 das despus de tener sexo. Funcionan mejor cuando se toman lo ms pronto posible luego de tener sexo. La mayora de los anticonceptivos de emergencia estn disponibles sin receta mdica. Este mtodo no debe utilizarse como el nico mtodo anticonceptivo.   Mtodos de  barrera Condn masculino Un condn masculino es una vaina delgada que se coloca sobre el pene durante el sexo. Los condones evitan que el esperma ingrese en el cuerpo de la mujer. Pueden utilizarse con un una sustancia que mata a los espermatozoides (espermicida) para aumentar la efectividad. Deben desecharse despus de un uso. Condn femenino Un condn femenino es una vaina blanda y holgada que se coloca en la vagina antes de tener sexo. El condn evita que el esperma ingrese en el cuerpo de la mujer. Deben desecharse despus de un uso. Diafragma Un diafragma es una barrera blanda con forma de cpula. Se inserta en la vagina antes del sexo, junto con un espermicida. El diafragma bloquea el ingreso de esperma en el tero, y el espermicida mata a los espermatozoides. El diafragma debe permanecer en la vagina durante 6 a 8 horas despus de tener sexo y debe retirarse en el plazo de las 24 horas. Un diafragma es recetado y colocado por un mdico. Debe reemplazarse cada 1 a 2 aos, despus de dar a luz, de aumentar ms de 15lb (6.8kg) y de una ciruga plvica. Capuchn cervical Un capuchn cervical es una copa redonda y blanda de ltex o plstico que se coloca en el cuello uterino. Se inserta en la vagina antes del sexo, junto con un espermicida. Bloquea el ingreso del esperma en el tero. El capuchn debe permanecer en el lugar durante 6 a 8 horas despus de tener sexo y debe retirarse en el plazo de las 48 horas. Un capuchn cervical debe ser recetado y colocado por un mdico. Debe reemplazarse cada 2aos. Esponja Una esponja es una pieza blanda y circular de espuma de poliuretano que contiene espermicida. La esponja ayuda a bloquear el ingreso de esperma en el tero, y el   espermicida mata a los espermatozoides. Para utilizarla, debe humedecerla e insertarla en la vagina. Debe insertarse antes de tener sexo, debe permanecer dentro al menos durante 6 horas despus de tener sexo y debe retirarse y  desecharse en el plazo de las 30 horas. Espermicidas Los espermicidas son sustancias qumicas que matan o bloquean al esperma y no lo dejan ingresar al cuello uterino y al tero. Vienen en forma de crema, gel, supositorio, espuma o comprimido. Un espermicida debe insertarse en la vagina con un aplicador al menos 10 o 15 minutos antes de tener sexo para dar tiempo a que surta efecto. El proceso debe repetirse cada vez que tenga sexo. Los espermicidas no requieren receta mdica.   Anticonceptivos intrauterinos Dispositivo intrauterino (DIU) Un DIU es un dispositivo en forma de T que se coloca en el tero. Existen dos tipos:  DIU hormonal.Este tipo contiene progestina, una forma sinttica de la hormona progesterona. Este tipo puede permanecer colocado durante 3 a 5 aos.  DIU de cobre.Este tipo est recubierto con un alambre de cobre. Puede permanecer colocado durante 10 aos. Mtodos anticonceptivos permanentes Ligadura de trompas en la mujer En este mtodo, se sellan, atan u obstruyen las trompas de Falopio durante una ciruga para evitar que el vulo descienda hacia el tero. Esterilizacin histeroscpica En este mtodo, se coloca un implante pequeo y flexible dentro de cada trompa de Falopio. Los implantes hacen que se forme un tejido cicatricial en las trompas de Falopio y que las obstruya para que el espermatozoide no pueda llegar al vulo. El procedimiento demora alrededor de 3 meses para que sea efectivo. Debe utilizarse otro mtodo anticonceptivo durante esos 3 meses. Esterilizacin masculina Este es un procedimiento que consiste en atar los conductos que transportan el esperma (vasectoma). Luego del procedimiento, el hombre puede eyacular lquido (semen). Debe utilizarse otro mtodo anticonceptivo durante 3 meses despus del procedimiento. Mtodos de planificacin natural Planificacin familiar natural En este mtodo, la pareja no tiene sexo durante los das en que la mujer podra quedar  embarazada. Mtodo calendario En este mtodo, la mujer realiza un seguimiento de la duracin de cada ciclo menstrual, identifica los das en los que se puede producir un embarazo y no tiene sexo durante esos das. Mtodo de la ovulacin En este mtodo, la pareja evita tener sexo durante la ovulacin. Mtodo sintotrmico Este mtodo implica no tener sexo durante la ovulacin. Normalmente, la mujer comprueba la ovulacin al observar cambios en su temperatura y en la consistencia del moco cervical. Mtodo posovulacin En este mtodo, la pareja espera a que finalice la ovulacin para tener sexo. Dnde buscar ms informacin  Centers for Disease Control and Prevention (Centros para el Control y la Prevencin de Enfermedades): www.cdc.gov Resumen  La anticoncepcin, o los mtodos anticonceptivos, hace referencia a los mtodos o dispositivos que evitan el embarazo.  Los mtodos anticonceptivos hormonales incluyen implantes, inyecciones, pastillas, parches, anillos vaginales y anticonceptivos de emergencia.  Los mtodos anticonceptivos de barrera pueden incluir condones masculinos, condones femeninos, diafragmas, capuchones cervicales, esponjas y espermicidas.  Existen dos tipos de DIU (dispositivo intrauterino). Un DIU puede colocarse en el tero de una mujer para evitar el embarazo durante 3 a 5 aos.  La esterilizacin permanente puede realizarse mediante un procedimiento tanto en los hombres como en las mujeres. Los mtodos de planificacin familiar natural implican no tener sexo durante los das en que la mujer podra quedar embarazada. Esta informacin no tiene como fin reemplazar el consejo del mdico. Asegrese de hacerle al mdico cualquier pregunta que   tenga. Document Revised: 10/06/2019 Document Reviewed: 10/06/2019 Elsevier Patient Education  2021 Else[employer] Inc.   Lactancia materna Breastfeeding  Decidir amamantar es una de las mejores elecciones que puede hacer por usted y su  beb. Un cambio en las hormonas durante el embarazo hace que las mamas produzcan leche materna en las glndulas productoras de leche. Las hormonas impiden que la leche materna sea liberada antes del nacimiento del beb. Adems, impulsan el flujo de leche luego del nacimiento. Una vez que ha comenzado a amamantar, pensar en el beb, as como la succin o el llanto, pueden estimular la liberacin de leche de las glndulas productoras de leche. Los beneficios de amamantar Las investigaciones demuestran que la lactancia materna ofrece muchos beneficios de salud para bebs y madres. Adems, ofrece una forma gratuita y conveniente de alimentar al beb. Para el beb  La primera leche (calostro) ayuda a mejorar el funcionamiento del aparato digestivo del beb.  Las clulas especiales de la leche (anticuerpos) ayudan a combatir las infecciones en el beb.  Los bebs que se alimentan con leche materna tambin tienen menos probabilidades de tener asma, alergias, obesidad o diabetes de tipo 2. Adems, tienen menor riesgo de sufrir el sndrome de muerte sbita del lactante (SMSL).  Los nutrientes de la leche materna son mejores para satisfacer las necesidades del beb en comparacin con la leche maternizada.  La leche materna mejora el desarrollo cerebral del beb. Para usted  La lactancia materna favorece el desarrollo de un vnculo muy especial entre la madre y el beb.  Es conveniente. La leche materna es econmica y siempre est disponible a la temperatura correcta.  La lactancia materna ayuda a quemar caloras. Le ayuda a perder el peso ganado durante el embarazo.  Hace que el tero vuelva al tamao que tena antes del embarazo ms rpido. Adems, disminuye el sangrado (loquios) despus del parto.  La lactancia materna contribuye a reducir el riesgo de tener diabetes de tipo 2, osteoporosis, artritis reumatoide, enfermedades cardiovasculares y cncer de mama, ovario, tero y endometrio en el  futuro. Informacin bsica sobre la lactancia Comienzo de la lactancia  Encuentre un lugar cmodo para sentarse o acostarse, con un buen respaldo para el cuello y la espalda.  Coloque una almohada o una manta enrollada debajo del beb para acomodarlo a la altura de la mama (si est sentada). Las almohadas para amamantar se han diseado especialmente a fin de servir de apoyo para los brazos y el beb mientras amamanta.  Asegrese de que la barriga del beb (abdomen) est frente a la suya.  Masajee suavemente la mama. Con las yemas de los dedos, masajee los bordes exteriores de la mama hacia adentro, en direccin al pezn. Esto estimula el flujo de leche. Si la leche fluye lentamente, es posible que deba continuar con este movimiento durante la lactancia.  Sostenga la mama con 4 dedos por debajo y el pulgar por arriba del pezn (forme la letra "C" con la mano). Asegrese de que los dedos se encuentren lejos del pezn y de la boca del beb.  Empuje suavemente los labios del beb con el pezn o con el dedo.  Cuando la boca del beb se abra lo suficiente, acrquelo rpidamente a la mama e introduzca todo el pezn y la arola, tanto como sea posible, dentro de la boca del beb. La arola es la zona de color que rodea al pezn. ? Debe haber ms arola visible por arriba del labio superior del beb que por debajo del labio   inferior. ? Los labios del beb deben estar abiertos y extendidos hacia afuera (evertidos) para asegurar que el beb se prenda de forma adecuada y cmoda. ? La lengua del beb debe estar entre la enca inferior y la mama.  Asegrese de que la boca del beb est en la posicin correcta alrededor del pezn (prendido). Los labios del beb deben crear un sello sobre la mama y estar doblados hacia afuera (invertidos).  Es comn que el beb succione durante 2 a 3 minutos para que comience el flujo de leche materna. Cmo debe prenderse Es muy importante que le ensee al beb cmo  prenderse adecuadamente a la mama. Si el beb no se prende adecuadamente, puede causar dolor en los pezones, reducir la produccin de leche materna y hacer que el beb tenga un escaso aumento de peso. Adems, si el beb no se prende adecuadamente al pezn, puede tragar aire durante la alimentacin. Esto puede causarle molestias al beb. Hacer eructar al beb al cambiar de mama puede ayudarlo a liberar el aire. Sin embargo, ensearle al beb cmo prenderse a la mama adecuadamente es la mejor manera de evitar que se sienta molesto por tragar aire mientras se alimenta. Signos de que el beb se ha prendido adecuadamente al pezn  Tironea o succiona de modo silencioso, sin causarle dolor. Los labios del beb deben estar extendidos hacia afuera (evertidos).  Se escucha que traga cada 3 o 4 succiones una vez que la leche ha comenzado a fluir (despus de que se produzca el reflejo de eyeccin de la leche).  Hay movimientos musculares por arriba y por delante de sus odos al succionar. Signos de que el beb no se ha prendido adecuadamente al pezn  Hace ruidos de succin o de chasquido mientras se alimenta.  Siente dolor en los pezones. Si cree que el beb no se prendi correctamente, deslice el dedo en la comisura de la boca y colquelo entre las encas del beb para interrumpir la succin. Intente volver a comenzar a amamantar. Signos de lactancia materna exitosa Signos del beb  El beb disminuir gradualmente el nmero de succiones o dejar de succionar por completo.  El beb se quedar dormido.  El cuerpo del beb se relajar.  El beb retendr una pequea cantidad de leche en la boca.  El beb se desprender solo del pecho. Signos que presenta usted  Las mamas han aumentado la firmeza, el peso y el tamao 1 a 3 horas despus de amamantar.  Estn ms blandas inmediatamente despus de amamantar.  Se producen un aumento del volumen de leche y un cambio en su consistencia y color hacia el  quinto da de lactancia.  Los pezones no duelen, no estn agrietados ni sangran. Signos de que su beb recibe la cantidad de leche suficiente  Mojar por lo menos 1 o 2paales durante las primeras 24horas despus del nacimiento.  Mojar por lo menos 5 o 6paales cada 24horas durante la primera semana despus del nacimiento. La orina debe ser clara o de color amarillo plido a los 5das de vida.  Mojar entre 6 y 8paales cada 24horas a medida que el beb sigue creciendo y desarrollndose.  Defeca por lo menos 3 veces en 24 horas a los 5 das de vida. Las heces deben ser blandas y amarillentas.  Defeca por lo menos 3 veces en 24 horas a los 7 das de vida. Las heces deben ser grumosas y amarillentas.  No registra una prdida de peso mayor al 10% del peso al   nacer durante los primeros 3 das de vida.  Aumenta de peso un promedio de 4 a 7onzas (113 a 198g) por semana despus de los 4 das de vida.  Aumenta de peso, diariamente, de manera uniforme a partir de los 5 das de vida, sin registrar prdida de peso despus de las 2semanas de vida. Despus de alimentarse, es posible que el beb regurgite una pequea cantidad de leche. Esto es normal. Frecuencia y duracin de la lactancia El amamantamiento frecuente la ayudar a producir ms leche y puede prevenir dolores en los pezones y las mamas extremadamente llenas (congestin mamaria). Alimente al beb cuando muestre signos de hambre o si siente la necesidad de reducir la congestin de las mamas. Esto se denomina "lactancia a demanda". Las seales de que el beb tiene hambre incluyen las siguientes:  Aumento del estado de alerta, actividad o inquietud.  Mueve la cabeza de un lado a otro.  Abre la boca cuando se le toca la mejilla o la comisura de la boca (reflejo de bsqueda).  Aumenta las vocalizaciones, tales como sonidos de succin, se relame los labios, emite arrullos, suspiros o chirridos.  Mueve la mano hacia la boca y se chupa  los dedos o las manos.  Est molesto o llora. Evite el uso del chupete en las primeras 4 a 6 semanas despus del nacimiento del beb. Despus de este perodo, podr usar un chupete. Las investigaciones demostraron que el uso del chupete durante el primer ao de vida del beb disminuye el riesgo de tener el sndrome de muerte sbita del lactante (SMSL). Permita que el nio se alimente en cada mama todo lo que desee. Cuando el beb se desprende o se queda dormido mientras se est alimentando de la primera mama, ofrzcale la segunda. Debido a que, con frecuencia, los recin nacidos estn somnolientos las primeras semanas de vida, es posible que deba despertar al beb para alimentarlo. Los horarios de lactancia varan de un beb a otro. Sin embargo, las siguientes reglas pueden servir como gua para ayudarla a garantizar que el beb se alimenta adecuadamente:  Se puede amamantar a los recin nacidos (bebs de 4 semanas o menos de vida) cada 1 a 3 horas.  No deben transcurrir ms de 3 horas durante el da o 5 horas durante la noche sin que se amamante a los recin nacidos.  Debe amamantar al beb un mnimo de 8 veces en un perodo de 24 horas. Extraccin de leche materna La extraccin y el almacenamiento de la leche materna le permiten asegurarse de que el beb se alimente exclusivamente de su leche materna, aun en momentos en los que no puede amamantar. Esto tiene especial importancia si debe regresar al trabajo en el perodo en que an est amamantando o si no puede estar presente en los momentos en que el beb debe alimentarse. Su asesor en lactancia puede ayudarla a encontrar un mtodo de extraccin que funcione mejor para usted y orientarla sobre cunto tiempo es seguro almacenar leche materna.      Cmo cuidar las mamas durante la lactancia Los pezones pueden secarse, agrietarse y doler durante la lactancia. Las siguientes recomendaciones pueden ayudarla a mantener las mamas humectadas y  sanas:  Evite usar jabn en los pezones.  Use un sostn de soporte diseado especialmente para la lactancia materna. Evite usar sostenes con aro o sostenes muy ajustados (sostenes deportivos).  Seque al aire sus pezones durante 3 a 4minutos despus de amamantar al beb.  Utilice solo apsitos de algodn en   el sostn para absorber las prdidas de leche. La prdida de un poco de leche materna entre las tomas es normal.  Utilice lanolina sobre los pezones luego de amamantar. La lanolina ayuda a mantener la humedad normal de la piel. La lanolina pura no es perjudicial (no es txica) para el beb. Adems, puede extraer manualmente algunas gotas de leche materna y masajear suavemente esa leche sobre los pezones para que la leche se seque al aire. Durante las primeras semanas despus del nacimiento, algunas mujeres experimentan congestin mamaria. La congestin mamaria puede hacer que sienta las mamas pesadas, calientes y sensibles al tacto. El pico de la congestin mamaria ocurre en el plazo de los 3 a 5 das despus del parto. Las siguientes recomendaciones pueden ayudarla a aliviar la congestin mamaria:  Vace por completo las mamas al amamantar o extraer leche. Puede aplicar calor hmedo en las mamas (en la ducha o con toallas hmedas para manos) antes de amamantar o extraer leche. Esto aumenta la circulacin y ayuda a que la leche fluya. Si el beb no vaca por completo las mamas cuando lo amamanta, extraiga la leche restante despus de que haya finalizado.  Aplique compresas de hielo sobre las mamas inmediatamente despus de amamantar o extraer leche, a menos que le resulte demasiado incmodo. Haga lo siguiente: ? Ponga el hielo en una bolsa plstica. ? Coloque una toalla entre la piel y la bolsa de hielo. ? Coloque el hielo durante 20minutos, 2 o 3veces por da.  Asegrese de que el beb est prendido y se encuentre en la posicin correcta mientras lo alimenta. Si la congestin mamaria  persiste luego de 48 horas o despus de seguir estas recomendaciones, comunquese con su mdico o un asesor en lactancia. Recomendaciones de salud general durante la lactancia  Consuma 3 comidas y 3 colaciones saludables todos los das. Las madres bien alimentadas que amamantan necesitan entre 450 y 500 caloras adicionales por da. Puede cumplir con este requisito al aumentar la cantidad de una dieta equilibrada que realice.  Beba suficiente agua para mantener la orina clara o de color amarillo plido.  Descanse con frecuencia, reljese y siga tomando sus vitaminas prenatales para prevenir la fatiga, el estrs y los niveles bajos de vitaminas y minerales en el cuerpo (deficiencias de nutrientes).  No consuma ningn producto que contenga nicotina o tabaco, como cigarrillos y cigarrillos electrnicos. El beb puede verse afectado por las sustancias qumicas de los cigarrillos que pasan a la leche materna y por la exposicin al humo ambiental del tabaco. Si necesita ayuda para dejar de fumar, consulte al mdico.  Evite el consumo de alcohol.  No consuma drogas ilegales o marihuana.  Antes de usar cualquier medicamento, hable con el mdico. Estos incluyen medicamentos recetados y de venta libre, como tambin vitaminas y suplementos a base de hierbas. Algunos medicamentos, que pueden ser perjudiciales para el beb, pueden pasar a travs de la leche materna.  Puede quedar embarazada durante la lactancia. Si se desea un mtodo anticonceptivo, consulte al mdico sobre cules son las opciones seguras durante la lactancia. Dnde encontrar ms informacin: Liga internacional La [place]eche: www.llli.org. Comunquese con un mdico si:  Siente que quiere dejar de amamantar o se siente frustrada con la lactancia.  Sus pezones estn agrietados o sangran.  Sus mamas estn irritadas, sensibles o calientes.  Tiene los siguientes sntomas: ? Dolor en las mamas o en los pezones. ? Un rea hinchada en cualquiera  de las mamas. ? Fiebre o escalofros. ? Nuseas o vmitos. ?   Drenaje de otro lquido distinto de la leche materna desde los pezones.  Sus mamas no se llenan antes de amamantar al beb para el quinto da despus del parto.  Se siente triste y deprimida.  El beb: ? Est demasiado somnoliento como para comer bien. ? Tiene problemas para dormir. ? Tiene ms de 1 semana de vida y moja menos de 6 paales en un periodo de 24 horas. ? No ha aumentado de peso a los 5 das de vida.  El beb defeca menos de 3 veces en 24 horas.  La piel del beb o las partes blancas de los ojos se vuelven amarillentas. Solicite ayuda de inmediato si:  El beb est muy cansado (letargo) y no se quiere despertar para comer.  Le sube la fiebre sin causa. Resumen  La lactancia materna ofrece muchos beneficios de salud para bebs y madres.  Intente amamantar a su beb cuando muestre signos tempranos de hambre.  Haga cosquillas o empuje suavemente los labios del beb con el dedo o el pezn para lograr que el beb abra la boca. Acerque el beb a la mama. Asegrese de que la mayor parte de la arola se encuentre dentro de la boca del beb. Ofrzcale una mama y haga eructar al beb antes de pasar a la otra.  Hable con su mdico o asesor en lactancia si tiene dudas o problemas con la lactancia. Esta informacin no tiene como fin reemplazar el consejo del mdico. Asegrese de hacerle al mdico cualquier pregunta que tenga. Document Revised: 05/30/2017 Document Reviewed: 06/25/2016 Elsevier Patient Education  2021 Elsevier Inc.  

## 2020-04-06 NOTE — Progress Notes (Signed)
Left message for pt to return call to office for televisit.    I connected with  Tanja Port on 04/06/20 at  8:55 AM EST by telephone and verified that I am speaking with the correct person using two identifiers.   I discussed the limitations, risks, security and privacy concerns of performing an evaluation and management service by telephone and the availability of in person appointments. I also discussed with the patient that there may be a patient responsible charge related to this service. The patient expressed understanding and agreed to proceed.  Isabell Jarvis, RN 04/06/2020  8:19 AM

## 2020-04-11 ENCOUNTER — Other Ambulatory Visit: Payer: Self-pay

## 2020-04-11 ENCOUNTER — Encounter: Payer: Self-pay | Admitting: *Deleted

## 2020-04-11 ENCOUNTER — Ambulatory Visit: Payer: Self-pay | Admitting: *Deleted

## 2020-04-11 ENCOUNTER — Ambulatory Visit: Payer: Self-pay | Attending: Obstetrics and Gynecology

## 2020-04-11 DIAGNOSIS — E669 Obesity, unspecified: Secondary | ICD-10-CM

## 2020-04-11 DIAGNOSIS — O99213 Obesity complicating pregnancy, third trimester: Secondary | ICD-10-CM

## 2020-04-11 DIAGNOSIS — Z3A33 33 weeks gestation of pregnancy: Secondary | ICD-10-CM

## 2020-04-11 DIAGNOSIS — O0990 Supervision of high risk pregnancy, unspecified, unspecified trimester: Secondary | ICD-10-CM | POA: Insufficient documentation

## 2020-04-11 DIAGNOSIS — O24415 Gestational diabetes mellitus in pregnancy, controlled by oral hypoglycemic drugs: Secondary | ICD-10-CM | POA: Insufficient documentation

## 2020-04-11 DIAGNOSIS — O09213 Supervision of pregnancy with history of pre-term labor, third trimester: Secondary | ICD-10-CM

## 2020-04-11 DIAGNOSIS — O09523 Supervision of elderly multigravida, third trimester: Secondary | ICD-10-CM

## 2020-04-11 DIAGNOSIS — O09293 Supervision of pregnancy with other poor reproductive or obstetric history, third trimester: Secondary | ICD-10-CM

## 2020-04-18 ENCOUNTER — Ambulatory Visit: Payer: Self-pay

## 2020-04-18 ENCOUNTER — Ambulatory Visit: Payer: Self-pay | Attending: Obstetrics and Gynecology

## 2020-04-18 ENCOUNTER — Ambulatory Visit: Payer: Self-pay | Admitting: *Deleted

## 2020-04-18 ENCOUNTER — Other Ambulatory Visit: Payer: Self-pay

## 2020-04-18 ENCOUNTER — Encounter: Payer: Self-pay | Admitting: *Deleted

## 2020-04-18 DIAGNOSIS — Z3A34 34 weeks gestation of pregnancy: Secondary | ICD-10-CM

## 2020-04-18 DIAGNOSIS — O99213 Obesity complicating pregnancy, third trimester: Secondary | ICD-10-CM

## 2020-04-18 DIAGNOSIS — O09213 Supervision of pregnancy with history of pre-term labor, third trimester: Secondary | ICD-10-CM

## 2020-04-18 DIAGNOSIS — O09293 Supervision of pregnancy with other poor reproductive or obstetric history, third trimester: Secondary | ICD-10-CM

## 2020-04-18 DIAGNOSIS — O09523 Supervision of elderly multigravida, third trimester: Secondary | ICD-10-CM

## 2020-04-18 DIAGNOSIS — O24415 Gestational diabetes mellitus in pregnancy, controlled by oral hypoglycemic drugs: Secondary | ICD-10-CM

## 2020-04-18 DIAGNOSIS — O0990 Supervision of high risk pregnancy, unspecified, unspecified trimester: Secondary | ICD-10-CM | POA: Insufficient documentation

## 2020-04-18 DIAGNOSIS — E669 Obesity, unspecified: Secondary | ICD-10-CM

## 2020-04-20 ENCOUNTER — Ambulatory Visit (INDEPENDENT_AMBULATORY_CARE_PROVIDER_SITE_OTHER): Payer: Self-pay | Admitting: Family Medicine

## 2020-04-20 ENCOUNTER — Other Ambulatory Visit: Payer: Self-pay

## 2020-04-20 ENCOUNTER — Encounter: Payer: Self-pay | Admitting: Family Medicine

## 2020-04-20 VITALS — BP 128/84 | HR 76 | Wt 165.8 lb

## 2020-04-20 DIAGNOSIS — O24415 Gestational diabetes mellitus in pregnancy, controlled by oral hypoglycemic drugs: Secondary | ICD-10-CM

## 2020-04-20 DIAGNOSIS — Z758 Other problems related to medical facilities and other health care: Secondary | ICD-10-CM

## 2020-04-20 DIAGNOSIS — O09523 Supervision of elderly multigravida, third trimester: Secondary | ICD-10-CM

## 2020-04-20 DIAGNOSIS — O09299 Supervision of pregnancy with other poor reproductive or obstetric history, unspecified trimester: Secondary | ICD-10-CM

## 2020-04-20 DIAGNOSIS — O98513 Other viral diseases complicating pregnancy, third trimester: Secondary | ICD-10-CM

## 2020-04-20 DIAGNOSIS — D696 Thrombocytopenia, unspecified: Secondary | ICD-10-CM

## 2020-04-20 DIAGNOSIS — O99113 Other diseases of the blood and blood-forming organs and certain disorders involving the immune mechanism complicating pregnancy, third trimester: Secondary | ICD-10-CM

## 2020-04-20 DIAGNOSIS — O09899 Supervision of other high risk pregnancies, unspecified trimester: Secondary | ICD-10-CM

## 2020-04-20 DIAGNOSIS — Z789 Other specified health status: Secondary | ICD-10-CM

## 2020-04-20 DIAGNOSIS — U071 Other viral diseases complicating pregnancy, third trimester: Secondary | ICD-10-CM

## 2020-04-20 DIAGNOSIS — O0990 Supervision of high risk pregnancy, unspecified, unspecified trimester: Secondary | ICD-10-CM

## 2020-04-20 LAB — POCT URINALYSIS DIP (DEVICE)
Bilirubin Urine: NEGATIVE
Glucose, UA: NEGATIVE mg/dL
Hgb urine dipstick: NEGATIVE
Ketones, ur: NEGATIVE mg/dL
Leukocytes,Ua: NEGATIVE
Nitrite: NEGATIVE
Protein, ur: NEGATIVE mg/dL
Specific Gravity, Urine: 1.02 (ref 1.005–1.030)
Urobilinogen, UA: 0.2 mg/dL (ref 0.0–1.0)
pH: 6 (ref 5.0–8.0)

## 2020-04-20 NOTE — Progress Notes (Signed)
   Subjective:  Aimee Benjamin is a 40 y.o. [phone] at [redacted]w[redacted]d being seen today for ongoing prenatal care.  She is currently monitored for the following issues for this high-risk pregnancy and has Multigravida of advanced maternal age in third trimester; History of cervical incompetence in pregnancy, currently pregnant; Gestational diabetes mellitus (GDM), antepartum; BMI 32.0-32.9,adult; Language barrier; Supervision of high risk pregnancy, antepartum; Umbilical hernia without obstruction and without gangrene; History of preterm delivery, currently pregnant; COVID-19 affecting pregnancy in third trimester; and Gestational thrombocytopenia (HCC) on their problem list.  Patient reports no complaints.  Contractions: Irritability. Vag. Bleeding: None.  Movement: Present. Denies leaking of fluid.   The following portions of the patient's history were reviewed and updated as appropriate: allergies, current medications, past family history, past medical history, past social history, past surgical history and problem list. Problem list updated.  Objective:   Vitals:   04/20/20 1001  BP: 128/84  Pulse: 76  Weight: 165 lb 12.8 oz (75.2 kg)    Fetal Status: Fetal Heart Rate (bpm): 132   Movement: Present     General:  Alert, oriented and cooperative. Patient is in no acute distress.  Skin: Skin is warm and dry. No rash noted.   Cardiovascular: Normal heart rate noted  Respiratory: Normal respiratory effort, no problems with respiration noted  Abdomen: Soft, gravid, appropriate for gestational age. Pain/Pressure: Present     Pelvic: Vag. Bleeding: None     Cervical exam deferred        Extremities: Normal range of motion.  Edema: Trace  Mental Status: Normal mood and affect. Normal behavior. Normal judgment and thought content.   Urinalysis:      Assessment and Plan:  Pregnancy: [id number] at [redacted]w[redacted]d  1. Supervision of high risk pregnancy, antepartum BP and FHR normal Swabs at next  visit Discussed contraception, would like PP nexplanon at [facility]  2. Gestational diabetes mellitus (GDM) controlled on oral hypoglycemic drug, antepartum 80/81 sugars at goal today on metformin Following w MFM, most recent growth and BPP normal Plan for delivery at 39 weeks  3. Benign gestational thrombocytopenia in third trimester (HCC) Most recent platelets 169, nadir 112  4. COVID-19 affecting pregnancy in third trimester Admitted 1/8 - 1/13 in setting of shortness of breath with +COVID, given steroids and remdesivir but never required supplemental O2 Today reports she feels well, breathing is normal  5. History of cervical incompetence in pregnancy, currently pregnant Cerclage in second pregnancy after 21 weeks loss, subsequently has had three term deliveries w/o placement of cerclage  6. History of preterm delivery, currently pregnant Too late for makena  7. Language barrier Spanish  8. Multigravida of advanced maternal age in third trimester Following w MFM for antenatal testing  Preterm labor symptoms and general obstetric precautions including but not limited to vaginal bleeding, contractions, leaking of fluid and fetal movement were reviewed in detail with the patient. Please refer to After Visit Summary for other counseling recommendations.  Return in 1 week (on 04/27/2020) for [facility], ob visit.   Venora Maples, MD

## 2020-04-20 NOTE — Patient Instructions (Signed)
 Contraception Choices Contraception, also called birth control, refers to methods or devices that prevent pregnancy. Hormonal methods Contraceptive implant A contraceptive implant is a thin, plastic tube that contains a hormone that prevents pregnancy. It is different from an intrauterine device (IUD). It is inserted into the upper part of the arm by a health care provider. Implants can be effective for up to 3 years. Progestin-only injections Progestin-only injections are injections of progestin, a synthetic form of the hormone progesterone. They are given every 3 months by a health care provider. Birth control pills Birth control pills are pills that contain hormones that prevent pregnancy. They must be taken once a day, preferably at the same time each day. A prescription is needed to use this method of contraception. Birth control patch The birth control patch contains hormones that prevent pregnancy. It is placed on the skin and must be changed once a week for three weeks and removed on the fourth week. A prescription is needed to use this method of contraception. Vaginal ring A vaginal ring contains hormones that prevent pregnancy. It is placed in the vagina for three weeks and removed on the fourth week. After that, the process is repeated with a new ring. A prescription is needed to use this method of contraception. Emergency contraceptive Emergency contraceptives prevent pregnancy after unprotected sex. They come in pill form and can be taken up to 5 days after sex. They work best the sooner they are taken after having sex. Most emergency contraceptives are available without a prescription. This method should not be used as your only form of birth control.   Barrier methods Female condom A female condom is a thin sheath that is worn over the penis during sex. Condoms keep sperm from going inside a woman's body. They can be used with a sperm-killing substance (spermicide) to increase their  effectiveness. They should be thrown away after one use. Female condom A female condom is a soft, loose-fitting sheath that is put into the vagina before sex. The condom keeps sperm from going inside a woman's body. They should be thrown away after one use. Diaphragm A diaphragm is a soft, dome-shaped barrier. It is inserted into the vagina before sex, along with a spermicide. The diaphragm blocks sperm from entering the uterus, and the spermicide kills sperm. A diaphragm should be left in the vagina for 6-8 hours after sex and removed within 24 hours. A diaphragm is prescribed and fitted by a health care provider. A diaphragm should be replaced every 1-2 years, after giving birth, after gaining more than 15 lb (6.8 kg), and after pelvic surgery. Cervical cap A cervical cap is a round, soft latex or plastic cup that fits over the cervix. It is inserted into the vagina before sex, along with spermicide. It blocks sperm from entering the uterus. The cap should be left in place for 6-8 hours after sex and removed within 48 hours. A cervical cap must be prescribed and fitted by a health care provider. It should be replaced every 2 years. Sponge A sponge is a soft, circular piece of polyurethane foam with spermicide in it. The sponge helps block sperm from entering the uterus, and the spermicide kills sperm. To use it, you make it wet and then insert it into the vagina. It should be inserted before sex, left in for at least 6 hours after sex, and removed and thrown away within 30 hours. Spermicides Spermicides are chemicals that kill or block sperm from entering the   cervix and uterus. They can come as a cream, jelly, suppository, foam, or tablet. A spermicide should be inserted into the vagina with an applicator at least 10-15 minutes before sex to allow time for it to work. The process must be repeated every time you have sex. Spermicides do not require a prescription.   Intrauterine  contraception Intrauterine device (IUD) An IUD is a T-shaped device that is put in a woman's uterus. There are two types:  Hormone IUD.This type contains progestin, a synthetic form of the hormone progesterone. This type can stay in place for 3-5 years.  Copper IUD.This type is wrapped in copper wire. It can stay in place for 10 years. Permanent methods of contraception Female tubal ligation In this method, a woman's fallopian tubes are sealed, tied, or blocked during surgery to prevent eggs from traveling to the uterus. Hysteroscopic sterilization In this method, a small, flexible insert is placed into each fallopian tube. The inserts cause scar tissue to form in the fallopian tubes and block them, so sperm cannot reach an egg. The procedure takes about 3 months to be effective. Another form of birth control must be used during those 3 months. Female sterilization This is a procedure to tie off the tubes that carry sperm (vasectomy). After the procedure, the man can still ejaculate fluid (semen). Another form of birth control must be used for 3 months after the procedure. Natural planning methods Natural family planning In this method, a couple does not have sex on days when the woman could become pregnant. Calendar method In this method, the woman keeps track of the length of each menstrual cycle, identifies the days when pregnancy can happen, and does not have sex on those days. Ovulation method In this method, a couple avoids sex during ovulation. Symptothermal method This method involves not having sex during ovulation. The woman typically checks for ovulation by watching changes in her temperature and in the consistency of cervical mucus. Post-ovulation method In this method, a couple waits to have sex until after ovulation. Where to find more information  Centers for Disease Control and Prevention: www.cdc.gov Summary  Contraception, also called birth control, refers to methods or  devices that prevent pregnancy.  Hormonal methods of contraception include implants, injections, pills, patches, vaginal rings, and emergency contraceptives.  Barrier methods of contraception can include female condoms, female condoms, diaphragms, cervical caps, sponges, and spermicides.  There are two types of IUDs (intrauterine devices). An IUD can be put in a woman's uterus to prevent pregnancy for 3-5 years.  Permanent sterilization can be done through a procedure for males and females. Natural family planning methods involve nothaving sex on days when the woman could become pregnant. This information is not intended to replace advice given to you by your health care provider. Make sure you discuss any questions you have with your health care provider. Document Revised: 08/10/2019 Document Reviewed: 08/10/2019 Elsevier Patient Education  2021 Elsevier Inc.   Breastfeeding  Choosing to breastfeed is one of the best decisions you can make for yourself and your baby. A change in hormones during pregnancy causes your breasts to make breast milk in your milk-producing glands. Hormones prevent breast milk from being released before your baby is born. They also prompt milk flow after birth. Once breastfeeding has begun, thoughts of your baby, as well as his or her sucking or crying, can stimulate the release of milk from your milk-producing glands. Benefits of breastfeeding Research shows that breastfeeding offers many health benefits   for infants and mothers. It also offers a cost-free and convenient way to feed your baby. For your baby  Your first milk (colostrum) helps your baby's digestive system to function better.  Special cells in your milk (antibodies) help your baby to fight off infections.  Breastfed babies are less likely to develop asthma, allergies, obesity, or type 2 diabetes. They are also at lower risk for sudden infant death syndrome (SIDS).  Nutrients in breast milk are better  able to meet your baby's needs compared to infant formula.  Breast milk improves your baby's brain development. For you  Breastfeeding helps to create a very special bond between you and your baby.  Breastfeeding is convenient. Breast milk costs nothing and is always available at the correct temperature.  Breastfeeding helps to burn calories. It helps you to lose the weight that you gained during pregnancy.  Breastfeeding makes your uterus return faster to its size before pregnancy. It also slows bleeding (lochia) after you give birth.  Breastfeeding helps to lower your risk of developing type 2 diabetes, osteoporosis, rheumatoid arthritis, cardiovascular disease, and breast, ovarian, uterine, and endometrial cancer later in life. Breastfeeding basics Starting breastfeeding  Find a comfortable place to sit or lie down, with your neck and back well-supported.  Place a pillow or a rolled-up blanket under your baby to bring him or her to the level of your breast (if you are seated). Nursing pillows are specially designed to help support your arms and your baby while you breastfeed.  Make sure that your baby's tummy (abdomen) is facing your abdomen.  Gently massage your breast. With your fingertips, massage from the outer edges of your breast inward toward the nipple. This encourages milk flow. If your milk flows slowly, you may need to continue this action during the feeding.  Support your breast with 4 fingers underneath and your thumb above your nipple (make the letter "C" with your hand). Make sure your fingers are well away from your nipple and your baby's mouth.  Stroke your baby's lips gently with your finger or nipple.  When your baby's mouth is open wide enough, quickly bring your baby to your breast, placing your entire nipple and as much of the areola as possible into your baby's mouth. The areola is the colored area around your nipple. ? More areola should be visible above your  baby's upper lip than below the lower lip. ? Your baby's lips should be opened and extended outward (flanged) to ensure an adequate, comfortable latch. ? Your baby's tongue should be between his or her lower gum and your breast.  Make sure that your baby's mouth is correctly positioned around your nipple (latched). Your baby's lips should create a seal on your breast and be turned out (everted).  It is common for your baby to suck about 2-3 minutes in order to start the flow of breast milk. Latching Teaching your baby how to latch onto your breast properly is very important. An improper latch can cause nipple pain, decreased milk supply, and poor weight gain in your baby. Also, if your baby is not latched onto your nipple properly, he or she may swallow some air during feeding. This can make your baby fussy. Burping your baby when you switch breasts during the feeding can help to get rid of the air. However, teaching your baby to latch on properly is still the best way to prevent fussiness from swallowing air while breastfeeding. Signs that your baby has successfully latched onto   your nipple  Silent tugging or silent sucking, without causing you pain. Infant's lips should be extended outward (flanged).  Swallowing heard between every 3-4 sucks once your milk has started to flow (after your let-down milk reflex occurs).  Muscle movement above and in front of his or her ears while sucking. Signs that your baby has not successfully latched onto your nipple  Sucking sounds or smacking sounds from your baby while breastfeeding.  Nipple pain. If you think your baby has not latched on correctly, slip your finger into the corner of your baby's mouth to break the suction and place it between your baby's gums. Attempt to start breastfeeding again. Signs of successful breastfeeding Signs from your baby  Your baby will gradually decrease the number of sucks or will completely stop sucking.  Your baby  will fall asleep.  Your baby's body will relax.  Your baby will retain a small amount of milk in his or her mouth.  Your baby will let go of your breast by himself or herself. Signs from you  Breasts that have increased in firmness, weight, and size 1-3 hours after feeding.  Breasts that are softer immediately after breastfeeding.  Increased milk volume, as well as a change in milk consistency and color by the fifth day of breastfeeding.  Nipples that are not sore, cracked, or bleeding. Signs that your baby is getting enough milk  Wetting at least 1-2 diapers during the first 24 hours after birth.  Wetting at least 5-6 diapers every 24 hours for the first week after birth. The urine should be clear or pale yellow by the age of 5 days.  Wetting 6-8 diapers every 24 hours as your baby continues to grow and develop.  At least 3 stools in a 24-hour period by the age of 5 days. The stool should be soft and yellow.  At least 3 stools in a 24-hour period by the age of 7 days. The stool should be seedy and yellow.  No loss of weight greater than 10% of birth weight during the first 3 days of life.  Average weight gain of 4-7 oz (113-198 g) per week after the age of 4 days.  Consistent daily weight gain by the age of 5 days, without weight loss after the age of 2 weeks. After a feeding, your baby may spit up a small amount of milk. This is normal. Breastfeeding frequency and duration Frequent feeding will help you make more milk and can prevent sore nipples and extremely full breasts (breast engorgement). Breastfeed when you feel the need to reduce the fullness of your breasts or when your baby shows signs of hunger. This is called "breastfeeding on demand." Signs that your baby is hungry include:  Increased alertness, activity, or restlessness.  Movement of the head from side to side.  Opening of the mouth when the corner of the mouth or cheek is stroked (rooting).  Increased  sucking sounds, smacking lips, cooing, sighing, or squeaking.  Hand-to-mouth movements and sucking on fingers or hands.  Fussing or crying. Avoid introducing a pacifier to your baby in the first 4-6 weeks after your baby is born. After this time, you may choose to use a pacifier. Research has shown that pacifier use during the first year of a baby's life decreases the risk of sudden infant death syndrome (SIDS). Allow your baby to feed on each breast as long as he or she wants. When your baby unlatches or falls asleep while feeding from the   first breast, offer the second breast. Because newborns are often sleepy in the first few weeks of life, you may need to awaken your baby to get him or her to feed. Breastfeeding times will vary from baby to baby. However, the following rules can serve as a guide to help you make sure that your baby is properly fed:  Newborns (babies 4 weeks of age or younger) may breastfeed every 1-3 hours.  Newborns should not go without breastfeeding for longer than 3 hours during the day or 5 hours during the night.  You should breastfeed your baby a minimum of 8 times in a 24-hour period. Breast milk pumping Pumping and storing breast milk allows you to make sure that your baby is exclusively fed your breast milk, even at times when you are unable to breastfeed. This is especially important if you go back to work while you are still breastfeeding, or if you are not able to be present during feedings. Your lactation consultant can help you find a method of pumping that works best for you and give you guidelines about how long it is safe to store breast milk.      Caring for your breasts while you breastfeed Nipples can become dry, cracked, and sore while breastfeeding. The following recommendations can help keep your breasts moisturized and healthy:  Avoid using soap on your nipples.  Wear a supportive bra designed especially for nursing. Avoid wearing underwire-style  bras or extremely tight bras (sports bras).  Air-dry your nipples for 3-4 minutes after each feeding.  Use only cotton bra pads to absorb leaked breast milk. Leaking of breast milk between feedings is normal.  Use lanolin on your nipples after breastfeeding. Lanolin helps to maintain your skin's normal moisture barrier. Pure lanolin is not harmful (not toxic) to your baby. You may also hand express a few drops of breast milk and gently massage that milk into your nipples and allow the milk to air-dry. In the first few weeks after giving birth, some women experience breast engorgement. Engorgement can make your breasts feel heavy, warm, and tender to the touch. Engorgement peaks within 3-5 days after you give birth. The following recommendations can help to ease engorgement:  Completely empty your breasts while breastfeeding or pumping. You may want to start by applying warm, moist heat (in the shower or with warm, water-soaked hand towels) just before feeding or pumping. This increases circulation and helps the milk flow. If your baby does not completely empty your breasts while breastfeeding, pump any extra milk after he or she is finished.  Apply ice packs to your breasts immediately after breastfeeding or pumping, unless this is too uncomfortable for you. To do this: ? Put ice in a plastic bag. ? Place a towel between your skin and the bag. ? Leave the ice on for 20 minutes, 2-3 times a day.  Make sure that your baby is latched on and positioned properly while breastfeeding. If engorgement persists after 48 hours of following these recommendations, contact your health care provider or a lactation consultant. Overall health care recommendations while breastfeeding  Eat 3 healthy meals and 3 snacks every day. Well-nourished mothers who are breastfeeding need an additional 450-500 calories a day. You can meet this requirement by increasing the amount of a balanced diet that you eat.  Drink  enough water to keep your urine pale yellow or clear.  Rest often, relax, and continue to take your prenatal vitamins to prevent fatigue, stress, and low   vitamin and mineral levels in your body (nutrient deficiencies).  Do not use any products that contain nicotine or tobacco, such as cigarettes and e-cigarettes. Your baby may be harmed by chemicals from cigarettes that pass into breast milk and exposure to secondhand smoke. If you need help quitting, ask your health care provider.  Avoid alcohol.  Do not use illegal drugs or marijuana.  Talk with your health care provider before taking any medicines. These include over-the-counter and prescription medicines as well as vitamins and herbal supplements. Some medicines that may be harmful to your baby can pass through breast milk.  It is possible to become pregnant while breastfeeding. If birth control is desired, ask your health care provider about options that will be safe while breastfeeding your baby. Where to find more information: [employer]: www.llli.org Contact a health care provider if:  You feel like you want to stop breastfeeding or have become frustrated with breastfeeding.  Your nipples are cracked or bleeding.  Your breasts are red, tender, or warm.  You have: ? Painful breasts or nipples. ? A swollen area on either breast. ? A fever or chills. ? Nausea or vomiting. ? Drainage other than breast milk from your nipples.  Your breasts do not become full before feedings by the fifth day after you give birth.  You feel sad and depressed.  Your baby is: ? Too sleepy to eat well. ? Having trouble sleeping. ? More than 1 week old and wetting fewer than 6 diapers in a 24-hour period. ? Not gaining weight by 5 days of age.  Your baby has fewer than 3 stools in a 24-hour period.  Your baby's skin or the white parts of his or her eyes become yellow. Get help right away if:  Your baby is overly tired  (lethargic) and does not want to wake up and feed.  Your baby develops an unexplained fever. Summary  Breastfeeding offers many health benefits for infant and mothers.  Try to breastfeed your infant when he or she shows early signs of hunger.  Gently tickle or stroke your baby's lips with your finger or nipple to allow the baby to open his or her mouth. Bring the baby to your breast. Make sure that much of the areola is in your baby's mouth. Offer one side and burp the baby before you offer the other side.  Talk with your health care provider or lactation consultant if you have questions or you face problems as you breastfeed. This information is not intended to replace advice given to you by your health care provider. Make sure you discuss any questions you have with your health care provider. Document Revised: 05/30/2017 Document Reviewed: 04/06/2016 Elsevier Patient Education  2021 Elsevier Inc.  

## 2020-04-20 NOTE — Progress Notes (Signed)
Spanish Interpreter Nohella T.

## 2020-04-25 ENCOUNTER — Telehealth: Payer: Self-pay

## 2020-04-25 NOTE — Telephone Encounter (Signed)
Create [facility] for her appts on 04/27/20, 05/04/20 and 05/11/2020.   All were sent to the patient via MyChart.

## 2020-04-27 ENCOUNTER — Ambulatory Visit: Payer: Self-pay | Admitting: *Deleted

## 2020-04-27 ENCOUNTER — Other Ambulatory Visit: Payer: Self-pay

## 2020-04-27 ENCOUNTER — Ambulatory Visit: Payer: Self-pay | Attending: Obstetrics and Gynecology

## 2020-04-27 ENCOUNTER — Encounter: Payer: Self-pay | Admitting: *Deleted

## 2020-04-27 DIAGNOSIS — O24415 Gestational diabetes mellitus in pregnancy, controlled by oral hypoglycemic drugs: Secondary | ICD-10-CM

## 2020-04-27 DIAGNOSIS — O99213 Obesity complicating pregnancy, third trimester: Secondary | ICD-10-CM

## 2020-04-27 DIAGNOSIS — O09523 Supervision of elderly multigravida, third trimester: Secondary | ICD-10-CM

## 2020-04-27 DIAGNOSIS — O0990 Supervision of high risk pregnancy, unspecified, unspecified trimester: Secondary | ICD-10-CM

## 2020-04-27 DIAGNOSIS — Z3A36 36 weeks gestation of pregnancy: Secondary | ICD-10-CM

## 2020-04-27 DIAGNOSIS — O322XX0 Maternal care for transverse and oblique lie, not applicable or unspecified: Secondary | ICD-10-CM

## 2020-04-27 DIAGNOSIS — O09213 Supervision of pregnancy with history of pre-term labor, third trimester: Secondary | ICD-10-CM

## 2020-04-29 ENCOUNTER — Other Ambulatory Visit (HOSPITAL_COMMUNITY)
Admission: RE | Admit: 2020-04-29 | Discharge: 2020-04-29 | Disposition: A | Discharge status: Home / Self Care | Payer: Self-pay | Source: Ambulatory Visit | Attending: Family Medicine | Admitting: Family Medicine

## 2020-04-29 ENCOUNTER — Encounter: Payer: Self-pay | Admitting: Family Medicine

## 2020-04-29 ENCOUNTER — Other Ambulatory Visit: Payer: Self-pay

## 2020-04-29 ENCOUNTER — Ambulatory Visit (INDEPENDENT_AMBULATORY_CARE_PROVIDER_SITE_OTHER): Payer: Self-pay | Admitting: Family Medicine

## 2020-04-29 VITALS — BP 130/87 | HR 80 | Wt 164.4 lb

## 2020-04-29 DIAGNOSIS — O09899 Supervision of other high risk pregnancies, unspecified trimester: Secondary | ICD-10-CM

## 2020-04-29 DIAGNOSIS — K429 Umbilical hernia without obstruction or gangrene: Secondary | ICD-10-CM

## 2020-04-29 DIAGNOSIS — O0990 Supervision of high risk pregnancy, unspecified, unspecified trimester: Secondary | ICD-10-CM

## 2020-04-29 DIAGNOSIS — O09299 Supervision of pregnancy with other poor reproductive or obstetric history, unspecified trimester: Secondary | ICD-10-CM

## 2020-04-29 DIAGNOSIS — O09523 Supervision of elderly multigravida, third trimester: Secondary | ICD-10-CM

## 2020-04-29 DIAGNOSIS — O99113 Other diseases of the blood and blood-forming organs and certain disorders involving the immune mechanism complicating pregnancy, third trimester: Secondary | ICD-10-CM

## 2020-04-29 DIAGNOSIS — Z758 Other problems related to medical facilities and other health care: Secondary | ICD-10-CM

## 2020-04-29 DIAGNOSIS — O98513 Other viral diseases complicating pregnancy, third trimester: Secondary | ICD-10-CM

## 2020-04-29 DIAGNOSIS — O24415 Gestational diabetes mellitus in pregnancy, controlled by oral hypoglycemic drugs: Secondary | ICD-10-CM

## 2020-04-29 DIAGNOSIS — Z789 Other specified health status: Secondary | ICD-10-CM

## 2020-04-29 DIAGNOSIS — U071 Other viral diseases complicating pregnancy, third trimester: Secondary | ICD-10-CM

## 2020-04-29 DIAGNOSIS — D696 Thrombocytopenia, unspecified: Secondary | ICD-10-CM

## 2020-04-29 LAB — POCT URINALYSIS DIP (DEVICE)
Bilirubin Urine: NEGATIVE
Glucose, UA: NEGATIVE mg/dL
Hgb urine dipstick: NEGATIVE
Ketones, ur: NEGATIVE mg/dL
Nitrite: NEGATIVE
Protein, ur: NEGATIVE mg/dL
Specific Gravity, Urine: 1.015 (ref 1.005–1.030)
Urobilinogen, UA: 0.2 mg/dL (ref 0.0–1.0)
pH: 7 (ref 5.0–8.0)

## 2020-04-29 NOTE — Progress Notes (Signed)
   Subjective:  Aimee Benjamin is a 40 y.o. [phone] at [redacted]w[redacted]d being seen today for ongoing prenatal care.  She is currently monitored for the following issues for this high-risk pregnancy and has Multigravida of advanced maternal age in third trimester; History of cervical incompetence in pregnancy, currently pregnant; Gestational diabetes mellitus (GDM), antepartum; BMI 32.0-32.9,adult; Language barrier; Supervision of high risk pregnancy, antepartum; Umbilical hernia without obstruction and without gangrene; History of preterm delivery, currently pregnant; COVID-19 affecting pregnancy in third trimester; and Gestational thrombocytopenia (HCC) on their problem list.  Patient reports no complaints.  Contractions: Irritability. Vag. Bleeding: None.  Movement: Present. Denies leaking of fluid.   The following portions of the patient's history were reviewed and updated as appropriate: allergies, current medications, past family history, past medical history, past social history, past surgical history and problem list. Problem list updated.  Objective:   Vitals:   04/29/20 1019  BP: 130/87  Pulse: 80  Weight: 164 lb 6.4 oz (74.6 kg)    Fetal Status: Fetal Heart Rate (bpm): 152   Movement: Present     General:  Alert, oriented and cooperative. Patient is in no acute distress.  Skin: Skin is warm and dry. No rash noted.   Cardiovascular: Normal heart rate noted  Respiratory: Normal respiratory effort, no problems with respiration noted  Abdomen: Soft, gravid, appropriate for gestational age. Pain/Pressure: Present     Pelvic: Vag. Bleeding: None     Cervical exam deferred        Extremities: Normal range of motion.  Edema: Trace  Mental Status: Normal mood and affect. Normal behavior. Normal judgment and thought content.   Urinalysis:      Assessment and Plan:  Pregnancy: [id number] at [redacted]w[redacted]d  1. Supervision of high risk pregnancy, antepartum FHR and BP normal Swabs today  2.  Gestational diabetes mellitus (GDM) controlled on oral hypoglycemic drug, antepartum Sugars are 100% at goal Last growth on 04/11/20 w EFW 2679g 89%, normal AFI BPP 8/8 on 04/27/2020 Continue weekly testing Plan for delivery at 39 weeks  3. Benign gestational thrombocytopenia in third trimester (HCC) Most recently 169  4. COVID-19 affecting pregnancy in third trimester Admitted 1/8 - 1/13 in setting of shortness of breath with +COVID, given steroids and remdesivir but never required supplemental O2  5. Language barrier Spanish  6. Multigravida of advanced maternal age in third trimester   7. Umbilical hernia without obstruction and without gangrene   8. History of preterm delivery, currently pregnant   9. History of cervical incompetence in pregnancy, currently pregnant Cerclage in second pregnancy after 21 weeks loss, subsequently has had three term deliveries w/o placement of cerclage  Preterm labor symptoms and general obstetric precautions including but not limited to vaginal bleeding, contractions, leaking of fluid and fetal movement were reviewed in detail with the patient. Please refer to After Visit Summary for other counseling recommendations.  Return in 1 week (on 05/06/2020) for [facility], ob visit.   Venora Maples, MD

## 2020-04-29 NOTE — Patient Instructions (Addendum)
 Eleccin del mtodo anticonceptivo Contraception Choices La anticoncepcin, o los mtodos anticonceptivos, hace referencia a los mtodos o dispositivos que evitan el embarazo. Mtodos hormonales Implante anticonceptivo Un implante anticonceptivo consiste en un tubo delgado de plstico que contiene una hormona que evita el embarazo. Es diferente de un dispositivo intrauterino (DIU). Un mdico lo inserta en la parte superior del brazo. Los implantes pueden ser eficaces durante un mximo de 3 aos. Inyecciones de progestina sola Las inyecciones de progestina sola contienen progestina, una forma sinttica de la hormona progesterona. Un mdico las administra cada 3 meses. Pldoras anticonceptivas Las pldoras anticonceptivas son pastillas que contienen hormonas que evitan el embarazo. Deben tomarse una vez al da, preferentemente a la misma hora cada da. Se necesita una receta para utilizar este mtodo anticonceptivo. Parche anticonceptivo El parche anticonceptivo contiene hormonas que evitan el embarazo. Se coloca en la piel, debe cambiarse una vez a la semana durante tres semanas y debe retirarse en la cuarta semana. Se necesita una receta para utilizar este mtodo anticonceptivo. Anillo vaginal Un anillo vaginal contiene hormonas que evitan el embarazo. Se coloca en la vagina durante tres semanas y se retira en la cuarta semana. Luego se repite el proceso con un anillo nuevo. Se necesita una receta para utilizar este mtodo anticonceptivo. Anticonceptivo de emergencia Los anticonceptivos de emergencia son mtodos para evitar un embarazo despus de tener sexo sin proteccin. Vienen en forma de pldora y pueden tomarse hasta 5 das despus de tener sexo. Funcionan mejor cuando se toman lo ms pronto posible luego de tener sexo. La mayora de los anticonceptivos de emergencia estn disponibles sin receta mdica. Este mtodo no debe utilizarse como el nico mtodo anticonceptivo.   Mtodos de  barrera Condn masculino Un condn masculino es una vaina delgada que se coloca sobre el pene durante el sexo. Los condones evitan que el esperma ingrese en el cuerpo de la mujer. Pueden utilizarse con un una sustancia que mata a los espermatozoides (espermicida) para aumentar la efectividad. Deben desecharse despus de un uso. Condn femenino Un condn femenino es una vaina blanda y holgada que se coloca en la vagina antes de tener sexo. El condn evita que el esperma ingrese en el cuerpo de la mujer. Deben desecharse despus de un uso. Diafragma Un diafragma es una barrera blanda con forma de cpula. Se inserta en la vagina antes del sexo, junto con un espermicida. El diafragma bloquea el ingreso de esperma en el tero, y el espermicida mata a los espermatozoides. El diafragma debe permanecer en la vagina durante 6 a 8 horas despus de tener sexo y debe retirarse en el plazo de las 24 horas. Un diafragma es recetado y colocado por un mdico. Debe reemplazarse cada 1 a 2 aos, despus de dar a luz, de aumentar ms de 15lb (6.8kg) y de una ciruga plvica. Capuchn cervical Un capuchn cervical es una copa redonda y blanda de ltex o plstico que se coloca en el cuello uterino. Se inserta en la vagina antes del sexo, junto con un espermicida. Bloquea el ingreso del esperma en el tero. El capuchn debe permanecer en el lugar durante 6 a 8 horas despus de tener sexo y debe retirarse en el plazo de las 48 horas. Un capuchn cervical debe ser recetado y colocado por un mdico. Debe reemplazarse cada 2aos. Esponja Una esponja es una pieza blanda y circular de espuma de poliuretano que contiene espermicida. La esponja ayuda a bloquear el ingreso de esperma en el tero, y el   espermicida mata a los espermatozoides. Para utilizarla, debe humedecerla e insertarla en la vagina. Debe insertarse antes de tener sexo, debe permanecer dentro al menos durante 6 horas despus de tener sexo y debe retirarse y  desecharse en el plazo de las 30 horas. Espermicidas Los espermicidas son sustancias qumicas que matan o bloquean al esperma y no lo dejan ingresar al cuello uterino y al tero. Vienen en forma de crema, gel, supositorio, espuma o comprimido. Un espermicida debe insertarse en la vagina con un aplicador al menos 10 o 15 minutos antes de tener sexo para dar tiempo a que surta efecto. El proceso debe repetirse cada vez que tenga sexo. Los espermicidas no requieren receta mdica.   Anticonceptivos intrauterinos Dispositivo intrauterino (DIU) Un DIU es un dispositivo en forma de T que se coloca en el tero. Existen dos tipos:  DIU hormonal.Este tipo contiene progestina, una forma sinttica de la hormona progesterona. Este tipo puede permanecer colocado durante 3 a 5 aos.  DIU de cobre.Este tipo est recubierto con un alambre de cobre. Puede permanecer colocado durante 10 aos. Mtodos anticonceptivos permanentes Ligadura de trompas en la mujer En este mtodo, se sellan, atan u obstruyen las trompas de Falopio durante una ciruga para evitar que el vulo descienda hacia el tero. Esterilizacin histeroscpica En este mtodo, se coloca un implante pequeo y flexible dentro de cada trompa de Falopio. Los implantes hacen que se forme un tejido cicatricial en las trompas de Falopio y que las obstruya para que el espermatozoide no pueda llegar al vulo. El procedimiento demora alrededor de 3 meses para que sea efectivo. Debe utilizarse otro mtodo anticonceptivo durante esos 3 meses. Esterilizacin masculina Este es un procedimiento que consiste en atar los conductos que transportan el esperma (vasectoma). Luego del procedimiento, el hombre puede eyacular lquido (semen). Debe utilizarse otro mtodo anticonceptivo durante 3 meses despus del procedimiento. Mtodos de planificacin natural Planificacin familiar natural En este mtodo, la pareja no tiene sexo durante los das en que la mujer podra quedar  embarazada. Mtodo calendario En este mtodo, la mujer realiza un seguimiento de la duracin de cada ciclo menstrual, identifica los das en los que se puede producir un embarazo y no tiene sexo durante esos das. Mtodo de la ovulacin En este mtodo, la pareja evita tener sexo durante la ovulacin. Mtodo sintotrmico Este mtodo implica no tener sexo durante la ovulacin. Normalmente, la mujer comprueba la ovulacin al observar cambios en su temperatura y en la consistencia del moco cervical. Mtodo posovulacin En este mtodo, la pareja espera a que finalice la ovulacin para tener sexo. Dnde buscar ms informacin  Centers for Disease Control and Prevention (Centros para el Control y la Prevencin de Enfermedades): www.cdc.gov Resumen  La anticoncepcin, o los mtodos anticonceptivos, hace referencia a los mtodos o dispositivos que evitan el embarazo.  Los mtodos anticonceptivos hormonales incluyen implantes, inyecciones, pastillas, parches, anillos vaginales y anticonceptivos de emergencia.  Los mtodos anticonceptivos de barrera pueden incluir condones masculinos, condones femeninos, diafragmas, capuchones cervicales, esponjas y espermicidas.  Existen dos tipos de DIU (dispositivo intrauterino). Un DIU puede colocarse en el tero de una mujer para evitar el embarazo durante 3 a 5 aos.  La esterilizacin permanente puede realizarse mediante un procedimiento tanto en los hombres como en las mujeres. Los mtodos de planificacin familiar natural implican no tener sexo durante los das en que la mujer podra quedar embarazada. Esta informacin no tiene como fin reemplazar el consejo del mdico. Asegrese de hacerle al mdico cualquier pregunta que   tenga. Document Revised: 10/06/2019 Document Reviewed: 10/06/2019 Elsevier Patient Education  2021 Elsevier Inc.   Lactancia materna Breastfeeding  Decidir amamantar es una de las mejores elecciones que puede hacer por usted y su  beb. Un cambio en las hormonas durante el embarazo hace que las mamas produzcan leche materna en las glndulas productoras de leche. Las hormonas impiden que la leche materna sea liberada antes del nacimiento del beb. Adems, impulsan el flujo de leche luego del nacimiento. Una vez que ha comenzado a amamantar, pensar en el beb, as como la succin o el llanto, pueden estimular la liberacin de leche de las glndulas productoras de leche. Los beneficios de amamantar Las investigaciones demuestran que la lactancia materna ofrece muchos beneficios de salud para bebs y madres. Adems, ofrece una forma gratuita y conveniente de alimentar al beb. Para el beb  La primera leche (calostro) ayuda a mejorar el funcionamiento del aparato digestivo del beb.  Las clulas especiales de la leche (anticuerpos) ayudan a combatir las infecciones en el beb.  Los bebs que se alimentan con leche materna tambin tienen menos probabilidades de tener asma, alergias, obesidad o diabetes de tipo 2. Adems, tienen menor riesgo de sufrir el sndrome de muerte sbita del lactante (SMSL).  Los nutrientes de la leche materna son mejores para satisfacer las necesidades del beb en comparacin con la leche maternizada.  La leche materna mejora el desarrollo cerebral del beb. Para usted  La lactancia materna favorece el desarrollo de un vnculo muy especial entre la madre y el beb.  Es conveniente. La leche materna es econmica y siempre est disponible a la temperatura correcta.  La lactancia materna ayuda a quemar caloras. Le ayuda a perder el peso ganado durante el embarazo.  Hace que el tero vuelva al tamao que tena antes del embarazo ms rpido. Adems, disminuye el sangrado (loquios) despus del parto.  La lactancia materna contribuye a reducir el riesgo de tener diabetes de tipo 2, osteoporosis, artritis reumatoide, enfermedades cardiovasculares y cncer de mama, ovario, tero y endometrio en el  futuro. Informacin bsica sobre la lactancia Comienzo de la lactancia  Encuentre un lugar cmodo para sentarse o acostarse, con un buen respaldo para el cuello y la espalda.  Coloque una almohada o una manta enrollada debajo del beb para acomodarlo a la altura de la mama (si est sentada). Las almohadas para amamantar se han diseado especialmente a fin de servir de apoyo para los brazos y el beb mientras amamanta.  Asegrese de que la barriga del beb (abdomen) est frente a la suya.  Masajee suavemente la mama. Con las yemas de los dedos, masajee los bordes exteriores de la mama hacia adentro, en direccin al pezn. Esto estimula el flujo de leche. Si la leche fluye lentamente, es posible que deba continuar con este movimiento durante la lactancia.  Sostenga la mama con 4 dedos por debajo y el pulgar por arriba del pezn (forme la letra "C" con la mano). Asegrese de que los dedos se encuentren lejos del pezn y de la boca del beb.  Empuje suavemente los labios del beb con el pezn o con el dedo.  Cuando la boca del beb se abra lo suficiente, acrquelo rpidamente a la mama e introduzca todo el pezn y la arola, tanto como sea posible, dentro de la boca del beb. La arola es la zona de color que rodea al pezn. ? Debe haber ms arola visible por arriba del labio superior del beb que por debajo del labio   inferior. ? Los labios del beb deben estar abiertos y extendidos hacia afuera (evertidos) para asegurar que el beb se prenda de forma adecuada y cmoda. ? La lengua del beb debe estar entre la enca inferior y la mama.  Asegrese de que la boca del beb est en la posicin correcta alrededor del pezn (prendido). Los labios del beb deben crear un sello sobre la mama y estar doblados hacia afuera (invertidos).  Es comn que el beb succione durante 2 a 3 minutos para que comience el flujo de leche materna. Cmo debe prenderse Es muy importante que le ensee al beb cmo  prenderse adecuadamente a la mama. Si el beb no se prende adecuadamente, puede causar dolor en los pezones, reducir la produccin de leche materna y hacer que el beb tenga un escaso aumento de peso. Adems, si el beb no se prende adecuadamente al pezn, puede tragar aire durante la alimentacin. Esto puede causarle molestias al beb. Hacer eructar al beb al cambiar de mama puede ayudarlo a liberar el aire. Sin embargo, ensearle al beb cmo prenderse a la mama adecuadamente es la mejor manera de evitar que se sienta molesto por tragar aire mientras se alimenta. Signos de que el beb se ha prendido adecuadamente al pezn  Tironea o succiona de modo silencioso, sin causarle dolor. Los labios del beb deben estar extendidos hacia afuera (evertidos).  Se escucha que traga cada 3 o 4 succiones una vez que la leche ha comenzado a fluir (despus de que se produzca el reflejo de eyeccin de la leche).  Hay movimientos musculares por arriba y por delante de sus odos al succionar. Signos de que el beb no se ha prendido adecuadamente al pezn  Hace ruidos de succin o de chasquido mientras se alimenta.  Siente dolor en los pezones. Si cree que el beb no se prendi correctamente, deslice el dedo en la comisura de la boca y colquelo entre las encas del beb para interrumpir la succin. Intente volver a comenzar a amamantar. Signos de lactancia materna exitosa Signos del beb  El beb disminuir gradualmente el nmero de succiones o dejar de succionar por completo.  El beb se quedar dormido.  El cuerpo del beb se relajar.  El beb retendr una pequea cantidad de leche en la boca.  El beb se desprender solo del pecho. Signos que presenta usted  Las mamas han aumentado la firmeza, el peso y el tamao 1 a 3 horas despus de amamantar.  Estn ms blandas inmediatamente despus de amamantar.  Se producen un aumento del volumen de leche y un cambio en su consistencia y color hacia el  quinto da de lactancia.  Los pezones no duelen, no estn agrietados ni sangran. Signos de que su beb recibe la cantidad de leche suficiente  Mojar por lo menos 1 o 2paales durante las primeras 24horas despus del nacimiento.  Mojar por lo menos 5 o 6paales cada 24horas durante la primera semana despus del nacimiento. La orina debe ser clara o de color amarillo plido a los 5das de vida.  Mojar entre 6 y 8paales cada 24horas a medida que el beb sigue creciendo y desarrollndose.  Defeca por lo menos 3 veces en 24 horas a los 5 das de vida. Las heces deben ser blandas y amarillentas.  Defeca por lo menos 3 veces en 24 horas a los 7 das de vida. Las heces deben ser grumosas y amarillentas.  No registra una prdida de peso mayor al 10% del peso al   nacer durante los primeros 3 das de vida.  Aumenta de peso un promedio de 4 a 7onzas (113 a 198g) por semana despus de los 4 das de vida.  Aumenta de peso, diariamente, de manera uniforme a partir de los 5 das de vida, sin registrar prdida de peso despus de las 2semanas de vida. Despus de alimentarse, es posible que el beb regurgite una pequea cantidad de leche. Esto es normal. Frecuencia y duracin de la lactancia El amamantamiento frecuente la ayudar a producir ms leche y puede prevenir dolores en los pezones y las mamas extremadamente llenas (congestin mamaria). Alimente al beb cuando muestre signos de hambre o si siente la necesidad de reducir la congestin de las mamas. Esto se denomina "lactancia a demanda". Las seales de que el beb tiene hambre incluyen las siguientes:  Aumento del estado de alerta, actividad o inquietud.  Mueve la cabeza de un lado a otro.  Abre la boca cuando se le toca la mejilla o la comisura de la boca (reflejo de bsqueda).  Aumenta las vocalizaciones, tales como sonidos de succin, se relame los labios, emite arrullos, suspiros o chirridos.  Mueve la mano hacia la boca y se chupa  los dedos o las manos.  Est molesto o llora. Evite el uso del chupete en las primeras 4 a 6 semanas despus del nacimiento del beb. Despus de este perodo, podr usar un chupete. Las investigaciones demostraron que el uso del chupete durante el primer ao de vida del beb disminuye el riesgo de tener el sndrome de muerte sbita del lactante (SMSL). Permita que el nio se alimente en cada mama todo lo que desee. Cuando el beb se desprende o se queda dormido mientras se est alimentando de la primera mama, ofrzcale la segunda. Debido a que, con frecuencia, los recin nacidos estn somnolientos las primeras semanas de vida, es posible que deba despertar al beb para alimentarlo. Los horarios de lactancia varan de un beb a otro. Sin embargo, las siguientes reglas pueden servir como gua para ayudarla a garantizar que el beb se alimenta adecuadamente:  Se puede amamantar a los recin nacidos (bebs de 4 semanas o menos de vida) cada 1 a 3 horas.  No deben transcurrir ms de 3 horas durante el da o 5 horas durante la noche sin que se amamante a los recin nacidos.  Debe amamantar al beb un mnimo de 8 veces en un perodo de 24 horas. Extraccin de leche materna La extraccin y el almacenamiento de la leche materna le permiten asegurarse de que el beb se alimente exclusivamente de su leche materna, aun en momentos en los que no puede amamantar. Esto tiene especial importancia si debe regresar al trabajo en el perodo en que an est amamantando o si no puede estar presente en los momentos en que el beb debe alimentarse. Su asesor en lactancia puede ayudarla a encontrar un mtodo de extraccin que funcione mejor para usted y orientarla sobre cunto tiempo es seguro almacenar leche materna.      Cmo cuidar las mamas durante la lactancia Los pezones pueden secarse, agrietarse y doler durante la lactancia. Las siguientes recomendaciones pueden ayudarla a mantener las mamas humectadas y  sanas:  Evite usar jabn en los pezones.  Use un sostn de soporte diseado especialmente para la lactancia materna. Evite usar sostenes con aro o sostenes muy ajustados (sostenes deportivos).  Seque al aire sus pezones durante 3 a 4minutos despus de amamantar al beb.  Utilice solo apsitos de algodn en   el sostn para absorber las prdidas de leche. La prdida de un poco de leche materna entre las tomas es normal.  Utilice lanolina sobre los pezones luego de amamantar. La lanolina ayuda a mantener la humedad normal de la piel. La lanolina pura no es perjudicial (no es txica) para el beb. Adems, puede extraer manualmente algunas gotas de leche materna y masajear suavemente esa leche sobre los pezones para que la leche se seque al aire. Durante las primeras semanas despus del nacimiento, algunas mujeres experimentan congestin mamaria. La congestin mamaria puede hacer que sienta las mamas pesadas, calientes y sensibles al tacto. El pico de la congestin mamaria ocurre en el plazo de los 3 a 5 das despus del parto. Las siguientes recomendaciones pueden ayudarla a aliviar la congestin mamaria:  Vace por completo las mamas al amamantar o extraer leche. Puede aplicar calor hmedo en las mamas (en la ducha o con toallas hmedas para manos) antes de amamantar o extraer leche. Esto aumenta la circulacin y ayuda a que la leche fluya. Si el beb no vaca por completo las mamas cuando lo amamanta, extraiga la leche restante despus de que haya finalizado.  Aplique compresas de hielo sobre las mamas inmediatamente despus de amamantar o extraer leche, a menos que le resulte demasiado incmodo. Haga lo siguiente: ? Ponga el hielo en una bolsa plstica. ? Coloque una toalla entre la piel y la bolsa de hielo. ? Coloque el hielo durante 20minutos, 2 o 3veces por da.  Asegrese de que el beb est prendido y se encuentre en la posicin correcta mientras lo alimenta. Si la congestin mamaria  persiste luego de 48 horas o despus de seguir estas recomendaciones, comunquese con su mdico o un asesor en lactancia. Recomendaciones de salud general durante la lactancia  Consuma 3 comidas y 3 colaciones saludables todos los das. Las madres bien alimentadas que amamantan necesitan entre 450 y 500 caloras adicionales por da. Puede cumplir con este requisito al aumentar la cantidad de una dieta equilibrada que realice.  Beba suficiente agua para mantener la orina clara o de color amarillo plido.  Descanse con frecuencia, reljese y siga tomando sus vitaminas prenatales para prevenir la fatiga, el estrs y los niveles bajos de vitaminas y minerales en el cuerpo (deficiencias de nutrientes).  No consuma ningn producto que contenga nicotina o tabaco, como cigarrillos y cigarrillos electrnicos. El beb puede verse afectado por las sustancias qumicas de los cigarrillos que pasan a la leche materna y por la exposicin al humo ambiental del tabaco. Si necesita ayuda para dejar de fumar, consulte al mdico.  Evite el consumo de alcohol.  No consuma drogas ilegales o marihuana.  Antes de usar cualquier medicamento, hable con el mdico. Estos incluyen medicamentos recetados y de venta libre, como tambin vitaminas y suplementos a base de hierbas. Algunos medicamentos, que pueden ser perjudiciales para el beb, pueden pasar a travs de la leche materna.  Puede quedar embarazada durante la lactancia. Si se desea un mtodo anticonceptivo, consulte al mdico sobre cules son las opciones seguras durante la lactancia. Dnde encontrar ms informacin: Liga internacional La Leche: www.llli.org. Comunquese con un mdico si:  Siente que quiere dejar de amamantar o se siente frustrada con la lactancia.  Sus pezones estn agrietados o sangran.  Sus mamas estn irritadas, sensibles o calientes.  Tiene los siguientes sntomas: ? Dolor en las mamas o en los pezones. ? Un rea hinchada en cualquiera  de las mamas. ? Fiebre o escalofros. ? Nuseas o vmitos. ?   Drenaje de otro lquido distinto de la leche materna desde los pezones.  Sus mamas no se llenan antes de Museum/gallery exhibitions officer al beb para el quinto da despus del [place].  Se siente triste y deprimida.  El beb: ? Est demasiado somnoliento como para comer bien. ? Tiene problemas para dormir. ? Tiene ms de 1 semana de vida y [employer] de 6 paales en un periodo de 24 horas. ? No ha aumentado de [place] a los [address] de [address]  El beb defeca menos de 3 veces en 24 horas.  La piel del beb o las partes blancas de los ojos se vuelven amarillentas. Solicite ayuda de inmediato si:  El beb est muy cansado Retail buyer) y no se quiere despertar para comer.  Le sube la fiebre sin causa. Resumen  La lactancia materna ofrece muchos beneficios de salud para bebs y [place].  Intente amamantar a su beb cuando muestre signos tempranos de hambre.  Haga cosquillas o empuje suavemente los labios del beb con el dedo o el pezn para lograr que el beb abra la boca. Acerque el beb a la mama. Asegrese de que la mayor parte de la arola se encuentre dentro de la boca del beb. Ofrzcale una mama y haga eructar al beb antes de pasar a la otra.  Hable con su mdico o asesor en lactancia si tiene dudas o problemas con la lactancia. Esta informacin no tiene Theme park manager el consejo del mdico. Asegrese de hacerle al mdico cualquier pregunta que tenga. Document Revised: 05/30/2017 Document Reviewed: 06/25/2016 Elsevier Patient Education  2021 Elsevier Inc.    Prueba de deteccin de estreptococos del grupo B durante el embarazo Group B Streptococcus Test During Pregnancy Por qu me debo realizar esta prueba? Se recomienda realizar pruebas de rutina, tambin llamadas pruebas de deteccin, para estreptococos del grupo B (EGB) entre la semana 36 y 37 de Psychiatrist. Los EGB pertenecen a un tipo de bacteria que puede transmitirse de la madre al  beb durante el parto. Las pruebas de Financial controller a Chief Strategy Officer si usted Pension scheme manager o no tratamiento durante el [place] de parto y [place] para evitar complicaciones como:  Una infeccin en el tero durante el [place] de [place].  Una infeccin en el tero despus del parto.  Una infeccin grave en el beb despus del parto, como neumona, meningitis o sepsis. En general, la prueba de deteccin de EGB no se realiza antes de las [address] de [address], a menos que comience el trabajo de parto prematuramente. Qu sucede si tengo estreptococos del grupo B? Si las pruebas [employer] tiene EGB, Administrator tratamiento con antibiticos intravenosos durante el [place] de parto y [place]. Este tratamiento disminuye significativamente el riesgo de complicaciones para usted y el beb. Si tiene una cesrea planificada y tiene EGB, es posible que no necesite tratamiento con antibiticos porque los EGB se transmiten a los bebs generalmente despus de que comienza el [place] de parto y se rompe la bolsa de [place]. Si est en trabajo de parto o rompe la bolsa de aguas antes de la cesrea, es posible que los EGB se introduzcan en el tero y pasen al beb; en tal caso podra necesitar tratamiento. Existe la posibilidad de que no necesite hacerme pruebas? No es necesario que se le realice una prueba de deteccin de EGB si:  Tiene un anlisis de orina que [place] EGB antes de la semana 36 a 37.  Tuvo un beb con infeccin por EGB despus  de un parto anterior. En [employer], se la tratar automticamente para los EGB durante el [place] de parto y [place]. Qu se analiza? Esta prueba se realiza para verificar si tiene estreptococos del grupo B en la vagina o el recto. Qu tipo de [place] se toma? Para obtener muestras para esta prueba, Public librarian un exudado vaginal y rectal con un hisopo de algodn. Luego, la muestra se enva al laboratorio para determinar si hay EGB. Qu  ocurre durante la prueba?  Usted se Chief Strategy Officer ropa de la cintura [place].  Deber acostarse en una camilla en la misma posicin que lo hara para un examen plvico.  El Production manager un exudado vaginal y rectal con un hisopo de algodn para una prueba de cultivo.  Podr irse a casa y Education officer, environmental sus actividades habituales inmediatamente despus de los estudios.   Cmo se informan los resultados? Los [employer] de la prueba se informan como positivos o negativos. Qu significan los resultados?  Una prueba positiva significa que usted tiene riesgo de [employer] EGB al beb durante el [place] de parto y [place]. El Market researcher tratamiento con antibiticos intravenosos durante el [place] de parto y [place].  Una prueba con resultado negativo significa que tiene un riesgo muy bajo de transmitirle los EGB al beb. An existe un riesgo bajo de transmitir los EGB al beb porque a veces, los [employer] de la prueba pueden informar que usted no tiene una afeccin cuando realmente la tiene (resultado falso-negativo) o existe la posibilidad de que pueda infectarse con los EGB despus de la realizacin de la prueba. Es muy probable que no necesite tratamiento con un antibitico durante el [place] de parto y [place]. Hable con su mdico sobre lo que significan sus [place]. Preguntas para hacerle al mdico Consulte a su mdico o pregunte en el departamento donde se realiza la prueba acerca de lo siguiente:  Cundo estarn disponibles mis resultados?  Cmo obtendr mis resultados?  Cules son las opciones de tratamiento? Resumen  Se recomienda a todas las mujeres embarazadas la realizacin de pruebas de rutina (pruebas de Airline pilot) para la deteccin de estreptococos del grupo B (EGB) entre la semana 36 y 37 de Psychiatrist.  Los EGB pertenecen a un tipo de bacteria que puede transmitirse de la madre al beb durante el parto.  Si las pruebas [employer] tiene EGB, Loss adjuster, chartered tratamiento con antibiticos intravenosos durante el [place] de parto y [place]. Este tratamiento casi siempre evita la infeccin en los recin nacidos. Esta informacin no tiene Theme park manager el consejo del mdico. Asegrese de hacerle al mdico cualquier pregunta que tenga. Document Revised: 05/07/2018 Document Reviewed: 05/07/2018 Elsevier Patient Education  2021 [employer].

## 2020-04-29 NOTE — Progress Notes (Signed)
Davina Poke interpreter at bedside

## 2020-04-29 NOTE — Addendum Note (Signed)
Addended by: Isabell Jarvis on: 04/29/2020 12:06 PM   Modules accepted: Orders

## 2020-04-29 NOTE — Addendum Note (Signed)
Addended by: Isabell Jarvis on: 04/29/2020 12:17 PM   Modules accepted: Orders

## 2020-05-01 ENCOUNTER — Other Ambulatory Visit: Payer: Self-pay

## 2020-05-01 ENCOUNTER — Inpatient Hospital Stay (HOSPITAL_COMMUNITY): Payer: Medicaid Other | Admitting: Anesthesiology

## 2020-05-01 ENCOUNTER — Encounter (HOSPITAL_COMMUNITY): Payer: Self-pay | Admitting: Family Medicine

## 2020-05-01 ENCOUNTER — Inpatient Hospital Stay (HOSPITAL_COMMUNITY)
Admission: AD | Admit: 2020-05-01 | Discharge: 2020-05-03 | DRG: 807 | Disposition: A | Discharge status: Home / Self Care | Payer: Medicaid Other | Attending: Family Medicine | Admitting: Family Medicine

## 2020-05-01 DIAGNOSIS — O24419 Gestational diabetes mellitus in pregnancy, unspecified control: Secondary | ICD-10-CM | POA: Diagnosis present

## 2020-05-01 DIAGNOSIS — O24425 Gestational diabetes mellitus in childbirth, controlled by oral hypoglycemic drugs: Secondary | ICD-10-CM | POA: Diagnosis present

## 2020-05-01 DIAGNOSIS — O09523 Supervision of elderly multigravida, third trimester: Secondary | ICD-10-CM | POA: Diagnosis present

## 2020-05-01 DIAGNOSIS — D696 Thrombocytopenia, unspecified: Secondary | ICD-10-CM | POA: Diagnosis present

## 2020-05-01 DIAGNOSIS — O09299 Supervision of pregnancy with other poor reproductive or obstetric history, unspecified trimester: Secondary | ICD-10-CM

## 2020-05-01 DIAGNOSIS — Z789 Other specified health status: Secondary | ICD-10-CM | POA: Diagnosis present

## 2020-05-01 DIAGNOSIS — O98513 Other viral diseases complicating pregnancy, third trimester: Secondary | ICD-10-CM | POA: Diagnosis present

## 2020-05-01 DIAGNOSIS — O99119 Other diseases of the blood and blood-forming organs and certain disorders involving the immune mechanism complicating pregnancy, unspecified trimester: Secondary | ICD-10-CM | POA: Diagnosis present

## 2020-05-01 DIAGNOSIS — O1404 Mild to moderate pre-eclampsia, complicating childbirth: Secondary | ICD-10-CM | POA: Diagnosis present

## 2020-05-01 DIAGNOSIS — O42913 Preterm premature rupture of membranes, unspecified as to length of time between rupture and onset of labor, third trimester: Principal | ICD-10-CM | POA: Diagnosis present

## 2020-05-01 DIAGNOSIS — O1403 Mild to moderate pre-eclampsia, third trimester: Secondary | ICD-10-CM | POA: Diagnosis present

## 2020-05-01 DIAGNOSIS — Z880 Allergy status to penicillin: Secondary | ICD-10-CM

## 2020-05-01 DIAGNOSIS — O42113 Preterm premature rupture of membranes, onset of labor more than 24 hours following rupture, third trimester: Secondary | ICD-10-CM

## 2020-05-01 DIAGNOSIS — O0990 Supervision of high risk pregnancy, unspecified, unspecified trimester: Secondary | ICD-10-CM

## 2020-05-01 DIAGNOSIS — Z8616 Personal history of COVID-19: Secondary | ICD-10-CM | POA: Diagnosis present

## 2020-05-01 DIAGNOSIS — Z603 Acculturation difficulty: Secondary | ICD-10-CM | POA: Diagnosis present

## 2020-05-01 DIAGNOSIS — O0943 Supervision of pregnancy with grand multiparity, third trimester: Secondary | ICD-10-CM

## 2020-05-01 DIAGNOSIS — U071 COVID-19: Secondary | ICD-10-CM | POA: Diagnosis present

## 2020-05-01 DIAGNOSIS — Z6832 Body mass index (BMI) 32.0-32.9, adult: Secondary | ICD-10-CM

## 2020-05-01 DIAGNOSIS — Z3A36 36 weeks gestation of pregnancy: Secondary | ICD-10-CM

## 2020-05-01 DIAGNOSIS — O09899 Supervision of other high risk pregnancies, unspecified trimester: Secondary | ICD-10-CM

## 2020-05-01 DIAGNOSIS — Z8751 Personal history of pre-term labor: Secondary | ICD-10-CM

## 2020-05-01 LAB — CBC
HCT: 41.3 % (ref 36.0–46.0)
Hemoglobin: 13.5 g/dL (ref 12.0–15.0)
MCH: 29.5 pg (ref 26.0–34.0)
MCHC: 32.7 g/dL (ref 30.0–36.0)
MCV: 90.4 fL (ref 80.0–100.0)
Platelets: 153 10*3/uL (ref 150–400)
RBC: 4.57 MIL/uL (ref 3.87–5.11)
RDW: 14.3 % (ref 11.5–15.5)
WBC: 8.1 10*3/uL (ref 4.0–10.5)
nRBC: 0 % (ref 0.0–0.2)

## 2020-05-01 LAB — TYPE AND SCREEN
ABO/RH(D): O POS
Antibody Screen: NEGATIVE

## 2020-05-01 LAB — COMPREHENSIVE METABOLIC PANEL
ALT: 34 U/L (ref 0–44)
AST: 32 U/L (ref 15–41)
Albumin: 2.8 g/dL — ABNORMAL LOW (ref 3.5–5.0)
Alkaline Phosphatase: 128 U/L — ABNORMAL HIGH (ref 38–126)
Anion gap: 9 (ref 5–15)
BUN: 14 mg/dL (ref 6–20)
CO2: 18 mmol/L — ABNORMAL LOW (ref 22–32)
Calcium: 8.7 mg/dL — ABNORMAL LOW (ref 8.9–10.3)
Chloride: 109 mmol/L (ref 98–111)
Creatinine, Ser: 0.58 mg/dL (ref 0.44–1.00)
GFR, Estimated: >60 mL/min (ref >60)
Glucose, Bld: 76 mg/dL (ref 70–99)
Potassium: 4.3 mmol/L (ref 3.5–5.1)
Sodium: 136 mmol/L (ref 135–145)
Total Bilirubin: 0.4 mg/dL (ref 0.3–1.2)
Total Protein: 6.4 g/dL — ABNORMAL LOW (ref 6.5–8.1)

## 2020-05-01 LAB — PROTEIN / CREATININE RATIO, URINE
Creatinine, Urine: 22.86 mg/dL
Protein Creatinine Ratio: 1.14 mg/mg{Cre} — ABNORMAL HIGH (ref 0.00–0.15)
Total Protein, Urine: 26 mg/dL

## 2020-05-01 LAB — POCT FERN TEST: POCT Fern Test: POSITIVE

## 2020-05-01 LAB — GROUP B STREP BY PCR: Group B strep by PCR: NEGATIVE

## 2020-05-01 LAB — GLUCOSE, CAPILLARY
Glucose-Capillary: 63 mg/dL — ABNORMAL LOW (ref 70–99)
Glucose-Capillary: 65 mg/dL — ABNORMAL LOW (ref 70–99)

## 2020-05-01 MED ORDER — ONDANSETRON HCL 4 MG/2ML IJ SOLN
4.0000 mg | Freq: Four times a day (QID) | INTRAMUSCULAR | Status: DC | PRN
  Administered 2020-05-02: 4 mg via INTRAVENOUS
  Filled 2020-05-01: qty 2

## 2020-05-01 MED ORDER — EPHEDRINE 5 MG/ML INJ: 10.0000 mg | INTRAVENOUS | Status: DC | PRN

## 2020-05-01 MED ORDER — FENTANYL-BUPIVACAINE-NACL 0.5-0.125-0.9 MG/250ML-% EP SOLN
EPIDURAL | Status: DC | PRN
  Administered 2020-05-01: 12 mL/h via EPIDURAL

## 2020-05-01 MED ORDER — PHENYLEPHRINE 40 MCG/ML (10ML) SYRINGE FOR IV PUSH (FOR BLOOD PRESSURE SUPPORT)
80.0000 ug | PREFILLED_SYRINGE | INTRAVENOUS | Status: DC | PRN
  Filled 2020-05-01: qty 10

## 2020-05-01 MED ORDER — LIDOCAINE HCL (PF) 1 % IJ SOLN
INTRAMUSCULAR | Status: DC | PRN
  Administered 2020-05-01: 5 mL via EPIDURAL

## 2020-05-01 MED ORDER — TRANEXAMIC ACID-NACL 1000-0.7 MG/100ML-% IV SOLN: 1000.0000 mg | INTRAVENOUS | Status: AC

## 2020-05-01 MED ORDER — TERBUTALINE SULFATE 1 MG/ML IJ SOLN: 0.2500 mg | Freq: Once | INTRAMUSCULAR | Status: DC | PRN

## 2020-05-01 MED ORDER — MISOPROSTOL 200 MCG PO TABS
1000.0000 ug | ORAL_TABLET | Freq: Once | ORAL | Status: AC
  Administered 2020-05-01: 1000 ug via RECTAL

## 2020-05-01 MED ORDER — ACETAMINOPHEN 325 MG PO TABS: 650.0000 mg | ORAL_TABLET | ORAL | Status: DC | PRN

## 2020-05-01 MED ORDER — FENTANYL-BUPIVACAINE-NACL 0.5-0.125-0.9 MG/250ML-% EP SOLN
12.0000 mL/h | EPIDURAL | Status: DC | PRN
  Filled 2020-05-01: qty 250

## 2020-05-01 MED ORDER — VANCOMYCIN HCL IN DEXTROSE 1-5 GM/200ML-% IV SOLN
1000.0000 mg | Freq: Two times a day (BID) | INTRAVENOUS | Status: DC
  Administered 2020-05-01: 1000 mg via INTRAVENOUS
  Filled 2020-05-01 (×2): qty 200

## 2020-05-01 MED ORDER — OXYTOCIN-SODIUM CHLORIDE 30-0.9 UT/500ML-% IV SOLN
1.0000 m[IU]/min | INTRAVENOUS | Status: DC
  Administered 2020-05-01: 2 m[IU]/min via INTRAVENOUS

## 2020-05-01 MED ORDER — TRANEXAMIC ACID-NACL 1000-0.7 MG/100ML-% IV SOLN
INTRAVENOUS | Status: AC
  Administered 2020-05-01: 1000 mg via INTRAVENOUS
  Filled 2020-05-01: qty 100

## 2020-05-01 MED ORDER — OXYTOCIN BOLUS FROM INFUSION
333.0000 mL | Freq: Once | INTRAVENOUS | Status: AC
  Administered 2020-05-01: 333 mL via INTRAVENOUS

## 2020-05-01 MED ORDER — OXYCODONE-ACETAMINOPHEN 5-325 MG PO TABS: 2.0000 | ORAL_TABLET | ORAL | Status: DC | PRN

## 2020-05-01 MED ORDER — OXYTOCIN-SODIUM CHLORIDE 30-0.9 UT/500ML-% IV SOLN
2.5000 [IU]/h | INTRAVENOUS | Status: DC
  Filled 2020-05-01: qty 500

## 2020-05-01 MED ORDER — MISOPROSTOL 200 MCG PO TABS
ORAL_TABLET | ORAL | Status: AC
  Filled 2020-05-01: qty 5

## 2020-05-01 MED ORDER — LIDOCAINE HCL (PF) 1 % IJ SOLN: 30.0000 mL | INTRAMUSCULAR | Status: DC | PRN

## 2020-05-01 MED ORDER — OXYCODONE-ACETAMINOPHEN 5-325 MG PO TABS: 1.0000 | ORAL_TABLET | ORAL | Status: DC | PRN

## 2020-05-01 MED ORDER — DIPHENHYDRAMINE HCL 50 MG/ML IJ SOLN: 12.5000 mg | INTRAMUSCULAR | Status: DC | PRN

## 2020-05-01 MED ORDER — LACTATED RINGERS IV SOLN
500.0000 mL | INTRAVENOUS | Status: DC | PRN
  Administered 2020-05-01: 500 mL via INTRAVENOUS

## 2020-05-01 MED ORDER — LACTATED RINGERS IV SOLN: 500.0000 mL | Freq: Once | INTRAVENOUS | Status: DC

## 2020-05-01 MED ORDER — LACTATED RINGERS IV SOLN: INTRAVENOUS | Status: DC

## 2020-05-01 MED ORDER — SOD CITRATE-CITRIC ACID 500-334 MG/5ML PO SOLN: 30.0000 mL | ORAL | Status: DC | PRN

## 2020-05-01 MED ORDER — PHENYLEPHRINE 40 MCG/ML (10ML) SYRINGE FOR IV PUSH (FOR BLOOD PRESSURE SUPPORT): 80.0000 ug | PREFILLED_SYRINGE | INTRAVENOUS | Status: DC | PRN

## 2020-05-01 NOTE — MAU Provider Note (Signed)
  S: Ms. Arnold Depinto is a 40 y.o. [phone] at [redacted]w[redacted]d  who presents to MAU today complaining LOF since 0830. Denies contractions. She denies vaginal bleeding. Reports decreased fetal movement.    O: BP 139/80 (BP Location: Right Arm)   Pulse 69   Temp 98.3 F (36.8 C) (Oral)   Resp 16   Ht 5' (1.524 m)   Wt 76.8 kg   LMP 08/01/2019   SpO2 99%   BMI 33.08 kg/m  GENERAL: Well-developed, well-nourished female in no acute distress.  HEAD: Normocephalic, atraumatic.  CHEST: Normal effort of breathing, regular heart rate ABDOMEN: Soft, nontender, gravid Cervical exam: 3.5/40/-3, vtx, fern positive BSUS: ?oblique Fetal Monitoring: Baseline: 135 Variability: mod Accelerations: + Decelerations: no Contractions: rare  MDM: cephalic confirmed by SVE, no signs of labor, plan for transfer (d/t high NICU census).  1345: [facility] here for transport, pt re-examined and now 5/80/-2 and reporting painful ctx q5-7 min. Discussed with Dr. Shawnie Pons, unsafe for transport and plan for admit to LD.   A: [redacted] weeks gestation PROM Active labor  P: Admit to LD Dr. Germaine Pomfret notified  Donette Larry, CNM 05/01/2020 12:07 PM

## 2020-05-01 NOTE — MAU Note (Signed)
Aimee Benjamin is a 40 y.o. at [redacted]w[redacted]d here in MAU reporting: LOF since 0830 this morning, fluid is clear. Fluid has soaked through her clothes. Having some mild sharp pains that are intermittent. No bleeding. DFM.  Onset of complaint: today  Pain score: 5/10  Vitals:   05/01/20 1010  BP: (!) 146/81  Pulse: 79  Resp: 16  Temp: 97.8 F (36.6 C)  SpO2: 99%     FHT:130  Lab orders placed from triage: none

## 2020-05-01 NOTE — Anesthesia Procedure Notes (Addendum)

## 2020-05-01 NOTE — MAU Note (Signed)
Donette Larry, CNM in room to confirm fetal presentation with bedside ultrasound; vertex position confrimed

## 2020-05-01 NOTE — Progress Notes (Signed)
Labor Progress Note Aimee Benjamin is a 40 y.o. [id number] at [redacted]w[redacted]d presented for labor management s/p PPROM at 0830 on 2/13.  S: Pt resting comfortable with epidural in place. Pt requests augmentation to speed labor process along.  O:  BP (!) 141/83   Pulse 69   Temp 98.2 F (36.8 C) (Oral)   Resp 16   Ht 5' (1.524 m)   Wt 74.4 kg   LMP 08/01/2019   SpO2 100%   BMI 32.03 kg/m  EFM: baseline 120/moderate variability/+accels/no decels Toco: ctx q9-10 min  CVE: Dilation: 6 Effacement (%): 70 Cervical Position: Posterior Station: -2 Presentation: Vertex Exam by:: Dela Sweeny MD   A&P: 40 y.o. [id number] [redacted]w[redacted]d presented for labor management s/p PPROM at 0830 on 2/13. #Labor s/p PPROM: Slow progress and contraction pattern now spacing out s/p epidural. After shared decision making with pt, will initiate pitocin at this time and up-titrate as clinically indicated. #Pain: epidural in place #FWB: Category 1 strip #GBS: unknown. Given preterm with severe PCN allergy,  Vancomycin initiated on arrival. #Preeclampsia w/o SF: UP:C 1.13 with unknown baseline. Pt asymptomatic. Now with multiple mild range blood pressures for over 4 hours, consistent with new diagnosis of Preeclampsia without SF. Will continue to monitor. #A2GDM: BG 76 >63. Will continue to monitor q2hr BG checks in active labor.  Sheila Oats, MD 8:54 PM

## 2020-05-01 NOTE — MAU Note (Signed)
IV Access Team called; left voicemail with name, unit, and need for IV acces/lab start.

## 2020-05-01 NOTE — Progress Notes (Signed)
Called by CNM Donette Larry in MAU to call [facility] and give report for patient transfer, given [facility] NICU full. Patient with PPROM today 0830, clear fluid, cervical exam 3.5/40/-3, contracting every 2-7 minutes. Patient not in pain, does not appear to be in active labor. Called [facility] and report given to shift supervisor, L&D charge RN and Dr. Jean Rosenthal, OBGYN on call. Please refer to note by Donette Larry for further details regarding POC for patient while in MAU. VSS and stable for transfer.  Alric Seton, MD OB Fellow, Faculty [facility], Center for [facility] 05/01/2020 11:47 AM

## 2020-05-01 NOTE — H&P (Addendum)
OBSTETRIC ADMISSION HISTORY AND PHYSICAL  Aimee Benjamin is a 40 y.o. female [phone] with IUP at 5335w4d by early US presenting for PPROM @0830  on 05/01/20. She reports +FMs, No LOF, no VB, no blurry vision, headaches or peripheral edema, and RUQ pain.  She plans on breast and bottle feeding. She requests nexplanon outpatient for birth control (@HD -no insurance). [facility]   Dating: By early u/s --->  Estimated Date of Delivery: 05/25/20  Sono:    @[redacted]w[redacted]d , CWD, normal anatomy, cephalic presentation,  2679g, 89% EFW   Prenatal History/Complications:  A2GDM (metformin) [place] AMA Hx of preterm delivery  Hx of cervical incompetence in pregnancy Umbilical hernia without obstruction and without gangrene  Language barrier: Spanish speaking  Benign gestational thrombocytopenia in third trimester (HCC)--resolved COVID + 03/27/20   Past Medical History: Past Medical History:  Diagnosis Date  . Diabetes mellitus without complication (HCC)   . GDM, class A2 11/28/2015   PP GTT Nml  . Incompetence of cervix   . Preterm labor     Past Surgical History: Past Surgical History:  Procedure Laterality Date  . CERVICAL CERCLAGE N/A 08/27/2012   Procedure: CERCLAGE CERVICAL;  Surgeon: Kathreen CosierBernard A Marshall, MD;  Location: [facility] ORS;  Service: Gynecology;  Laterality: N/A;  . CERVICAL CERCLAGE N/A 02/09/2014   Procedure: CERCLAGE CERVICAL;  Surgeon: Kathreen CosierBernard A Marshall, MD;  Location: [facility] ORS;  Service: Gynecology;  Laterality: N/A;  . CHOLECYSTECTOMY      Obstetrical History: OB History    Gravida  9   Para  4   Term  3   Preterm  1   AB  4   Living  3     SAB  4   IAB      Ectopic      Multiple  0   Live Births  4           Social History Social History   Socioeconomic History  . Marital status: Married    Spouse name: Not on file  . Number of children: Not on file  . Years of education: Not on file  . Highest education level: Not on  file  Occupational History  . Not on file  Tobacco Use  . Smoking status: Never Smoker  . Smokeless tobacco: Never Used  Vaping Use  . Vaping Use: Never used  Substance and Sexual Activity  . Alcohol use: No  . Drug use: Not Currently  . Sexual activity: Not Currently    Birth control/protection: None  Other Topics Concern  . Not on file  Social History Narrative  . Not on file   Social Determinants of Health   Financial Resource Strain: Not on file  Food Insecurity: No Food Insecurity  . Worried About Programme researcher, broadcasting/film/videounning Out of Food in the Last Year: Never true  . Ran Out of Food in the Last Year: Never true  Transportation Needs: No Transportation Needs  . Lack of Transportation (Medical): No  . Lack of Transportation (Non-Medical): No  Physical Activity: Not on file  Stress: Not on file  Social Connections: Not on file    Family History: Family History  Problem Relation Age of Onset  . Diabetes Mother     Allergies: Allergies  Allergen Reactions  . Aspirin Shortness Of Breath and Anxiety    Pt is currently taking aspirin 81 with no problem (03/27/2020)  . Penicillins Shortness Of Breath, Anxiety and Other (See Comments)    Per pts  husband pt had no swelling reaction w/this medication just SOB.   Has patient had a PCN reaction causing immediate rash, facial/tongue/throat swelling, SOB or lightheadedness with hypotension: Yes Has patient had a PCN reaction causing severe rash involving mucus membranes or skin necrosis: No Has patient had a PCN reaction that required hospitalization No Has patient had a PCN reaction occurring within the last 10 years: Yes If all of the above answers are "NO", then may proceed with Cepha    Pt denies allergies to latex, iodine, or shellfish.  Medications Prior to Admission  Medication Sig Dispense Refill Last Dose  . acetaminophen (TYLENOL) 500 MG tablet Take 500 mg by mouth every 6 (six) hours as needed for fever or headache (pain).   Past  Week at Unknown time  . aspirin EC 81 MG tablet Take 1 tablet (81 mg total) by mouth daily. Take after 12 weeks for prevention of preeclampsia later in pregnancy 300 tablet 2 05/01/2020 at 0800  . metFORMIN (GLUCOPHAGE) 500 MG tablet Take 1 tablet (500 mg total) by mouth daily with supper. 30 tablet 5 04/30/2020 at 2030  . Prenatal Vit-Fe Fumarate-FA (PRENATAL VITAMINS) 28-0.8 MG TABS Take 1 tablet by mouth daily.   05/01/2020 at 0800  . albuterol (VENTOLIN HFA) 108 (90 Base) MCG/ACT inhaler Inhale 2 puffs into the lungs every 6 (six) hours. (Patient not taking: No sig reported) 1 each 3      Review of Systems   All systems reviewed and negative except as stated in HPI  Blood pressure 130/78, pulse 66, temperature 98.3 F (36.8 C), temperature source Oral, resp. rate 17, height 5' (1.524 m), weight 76.8 kg, last menstrual period 08/01/2019, SpO2 99 %, unknown if currently breastfeeding. General appearance: alert, cooperative and no distress Lungs: Normal WOB Heart: regular rate  Abdomen: soft, non-tender; gravid Extremities: No sign of DVT Presentation: cephalic per exam Fetal monitoringBaseline: 130 bpm, Variability: Good {> 6 bpm), Accelerations: Reactive and Decelerations: Absent Uterine activity: irregular  Dilation: 3.5 (3-4) Effacement (%): 40 Station: -3 Exam by:: Shellee Milo, CNM   Prenatal labs: ABO, Rh: --/--/O POS (02/13 1242) Antibody: NEG (02/13 1242) Rubella: 5.31 (09/16 0952) RPR: Non Reactive (12/03 1038)  HBsAg: Negative (09/16 0952)  HIV: Non Reactive (12/03 1038)  GBS:   unknown, culture and pcr pending 2 hr Glucola: failed  Genetic screening:  Declined  Anatomy US: normal, limited by body habitus, follow up normal  Prenatal Transfer Tool  Maternal Diabetes: Yes:  Diabetes Type:  Insulin/Medication controlled: Metformin  Genetic Screening: Declined Maternal Ultrasounds/Referrals: Normal Fetal Ultrasounds or other Referrals:  Referred to Materal Fetal  Medicine  Maternal Substance Abuse:  No Significant Maternal Medications:  Meds include: Other: Metformin, Aspirin  Significant Maternal Lab Results: None, GBS unk  Results for orders placed or performed during the hospital encounter of 05/01/20 (from the past 24 hour(s))  [facility] Time: 05/01/20 11:34 AM  Result Value Ref Range   POCT Fern Test Positive = ruptured amniotic membanes   Type and screen   Collection Time: 05/01/20 12:42 PM  Result Value Ref Range   ABO/RH(D) O POS    Antibody Screen NEG    Sample Expiration      05/04/2020,2359 Performed at [facility], [address], [place], [place] [address]   CBC   Collection Time: 05/01/20 12:48 PM  Result Value Ref Range   WBC PENDING 4.0 - 10.5 K/uL   RBC 4.57 3.87 - 5.11  MIL/uL   Hemoglobin 13.5 12.0 - 15.0 g/dL   HCT 98.3 38.2 - 50.5 %   MCV 90.4 80.0 - 100.0 fL   MCH 29.5 26.0 - 34.0 pg   MCHC 32.7 30.0 - 36.0 g/dL   RDW 39.7 67.3 - 41.9 %   Platelets 153 150 - 400 K/uL   nRBC PENDING 0.0 - 0.2 %  Comprehensive metabolic panel   Collection Time: 05/01/20 12:48 PM  Result Value Ref Range   Sodium 136 135 - 145 mmol/L   Potassium 4.3 3.5 - 5.1 mmol/L   Chloride 109 98 - 111 mmol/L   CO2 18 (L) 22 - 32 mmol/L   Glucose, Bld 76 70 - 99 mg/dL   BUN 14 6 - 20 mg/dL   Creatinine, Ser 3.79 0.44 - 1.00 mg/dL   Calcium 8.7 (L) 8.9 - 10.3 mg/dL   Total Protein 6.4 (L) 6.5 - 8.1 g/dL   Albumin 2.8 (L) 3.5 - 5.0 g/dL   AST 32 15 - 41 U/L   ALT 34 0 - 44 U/L   Alkaline Phosphatase 128 (H) 38 - 126 U/L   Total Bilirubin 0.4 0.3 - 1.2 mg/dL   GFR, Estimated >02 >40 mL/min   Anion gap 9 5 - 15  Protein / creatinine ratio, urine   Collection Time: 05/01/20 12:48 PM  Result Value Ref Range   Creatinine, Urine 22.86 mg/dL   Total Protein, Urine 26 mg/dL   Protein Creatinine Ratio 1.14 (H) 0.00 - 0.15 mg/mg[Cre]    Patient Active Problem List   Diagnosis Date Noted  . COVID-19 affecting pregnancy  in third trimester 03/27/2020  . Gestational thrombocytopenia (HCC) 03/27/2020  . Supervision of high risk pregnancy, antepartum 12/03/2019  . Umbilical hernia without obstruction and without gangrene 12/03/2019  . History of preterm delivery, currently pregnant 12/03/2019  . Language barrier 03/08/2016  . Multigravida of advanced maternal age in third trimester 11/28/2015  . History of cervical incompetence in pregnancy, currently pregnant 11/28/2015  . Gestational diabetes mellitus (GDM), antepartum 11/28/2015  . BMI 32.0-32.9,adult 11/28/2015    Assessment/Plan:  Aimee Benjamin is a 40 y.o. [id number] at [redacted]w[redacted]d here for PPROM @0830 .    #PPROM at 0830 05/01/20. SVE in MAU was 3.5cm, and when she came to the floor progressed to 5cm. Will continue expectant management.  #Pain: Epidural  #FWB: Cat 1 strip  #ID: GBS culture and PCR pending. Since pre-term, treating with Vancomycin in the setting of PCN allergy and no clindamycin  #MOF: breast & bottle  #MOC: Nexplanon @[facility]  #A2GDM: Currently on Metformin. Reports BG have been well controlled. Denies issues with BG outside of pregnancy. Early A1c 5.9, suspect likely pre-diabetes. EFW 89% on 1/24, normal AFI. BPP 8/8 on 2/9. Will check CBG q4h while in latent labor, and q2h while in active labor.  #Elevated BP: 2 occurrences today, within 4 hours apart, technically does not meet preE criteria. P:C 1.14. CBC, CMP wnl. Will continue to monitor  #Multigravida of AMA #Hx of preterm delivery  #Hx of cervical incompetence in pregnancy: Cerclage in second pregnancy after 21 weeks loss, subsequently has had three term deliveries w/o placement of cerclage #umbilical hernia without obstruction and without gangrene  #language barrier: Spanish speaking. Stratus Interpreter ipad used.  #Benign gestational thrombocytopenia in third trimester [facility]): platelets 153 on admission   4/9, DO  Center for [facility], [facility] 05/01/2020, 1:54 PM  GME ATTESTATION:  I saw and evaluated the patient.  I agree with the findings and the plan of care as documented in the resident's note.  Alric Seton, MD OB Fellow, Faculty [facility], Center for [facility] 05/01/2020 5:25 PM

## 2020-05-01 NOTE — MAU Note (Signed)
Wylene Simmer, RNC from IV acces team present in room

## 2020-05-01 NOTE — Anesthesia Preprocedure Evaluation (Addendum)
Anesthesia Evaluation  Patient identified by MRN, date of birth, ID band Patient awake    Reviewed: Allergy & Precautions, Patient's Chart, lab work & pertinent test results  Airway Mallampati: II  TM Distance: >3 FB Neck ROM: Full    Dental no notable dental hx. (+) Teeth Intact   Pulmonary    Pulmonary exam normal breath sounds clear to auscultation       Cardiovascular Exercise Tolerance: Good negative cardio ROS Normal cardiovascular exam Rhythm:Regular Rate:Normal     Neuro/Psych negative neurological ROS  negative psych ROS   GI/Hepatic negative GI ROS, Neg liver ROS,   Endo/Other  diabetes  Renal/GU negative Renal ROS     Musculoskeletal   Abdominal   Peds  Hematology Lab Results      Component                Value               Date                      WBC                      8.1                 05/01/2020                HGB                      13.5                05/01/2020                HCT                      41.3                05/01/2020                MCV                      90.4                05/01/2020                PLT                      153                 05/01/2020              Anesthesia Other Findings   Reproductive/Obstetrics (+) Pregnancy                            Anesthesia Physical Anesthesia Plan  ASA: II  Anesthesia Plan: Epidural   Post-op Pain Management:    Induction:   PONV Risk Score and Plan: Treatment may vary due to age or medical condition  Airway Management Planned:   Additional Equipment:   Intra-op Plan:   Post-operative Plan:   Informed Consent: I have reviewed the patients History and Physical, chart, labs and discussed the procedure including the risks, benefits and alternatives for the proposed anesthesia with the patient or authorized representative who has indicated his/her understanding and acceptance.      Interpreter used for [employer] Discussed with:  Anesthesia Plan Comments: (36.4w G9p3 for LEA )        Anesthesia Quick Evaluation

## 2020-05-01 NOTE — Progress Notes (Signed)
Gave patient apple juice due to BS of 63.

## 2020-05-02 ENCOUNTER — Encounter (HOSPITAL_COMMUNITY): Payer: Self-pay | Admitting: Family Medicine

## 2020-05-02 DIAGNOSIS — O1415 Severe pre-eclampsia, complicating the puerperium: Secondary | ICD-10-CM

## 2020-05-02 DIAGNOSIS — Z603 Acculturation difficulty: Secondary | ICD-10-CM

## 2020-05-02 DIAGNOSIS — O24435 Gestational diabetes mellitus in puerperium, controlled by oral hypoglycemic drugs: Secondary | ICD-10-CM

## 2020-05-02 LAB — CERVICOVAGINAL ANCILLARY ONLY
Bacterial Vaginitis (gardnerella): NEGATIVE
Candida Glabrata: NEGATIVE
Candida Vaginitis: NEGATIVE
Chlamydia: NEGATIVE
Comment: NEGATIVE
Comment: NEGATIVE
Comment: NEGATIVE
Comment: NEGATIVE
Comment: NEGATIVE
Comment: NORMAL
Neisseria Gonorrhea: NEGATIVE
Trichomonas: NEGATIVE

## 2020-05-02 LAB — CBC
HCT: 41.3 % (ref 36.0–46.0)
HCT: 38.8 % (ref 36.0–46.0)
Hemoglobin: 14 g/dL (ref 12.0–15.0)
Hemoglobin: 13.5 g/dL (ref 12.0–15.0)
MCH: 30.7 pg (ref 26.0–34.0)
MCH: 31 pg (ref 26.0–34.0)
MCHC: 33.9 g/dL (ref 30.0–36.0)
MCHC: 34.8 g/dL (ref 30.0–36.0)
MCV: 90.6 fL (ref 80.0–100.0)
MCV: 89.2 fL (ref 80.0–100.0)
Platelets: 148 10*3/uL — ABNORMAL LOW (ref 150–400)
Platelets: 156 10*3/uL (ref 150–400)
RBC: 4.56 MIL/uL (ref 3.87–5.11)
RBC: 4.35 MIL/uL (ref 3.87–5.11)
RDW: 14.3 % (ref 11.5–15.5)
RDW: 14.3 % (ref 11.5–15.5)
WBC: 12.7 10*3/uL — ABNORMAL HIGH (ref 4.0–10.5)
WBC: 16.1 10*3/uL — ABNORMAL HIGH (ref 4.0–10.5)
nRBC: 0 % (ref 0.0–0.2)
nRBC: 0 % (ref 0.0–0.2)

## 2020-05-02 LAB — GLUCOSE, CAPILLARY: Glucose-Capillary: 80 mg/dL (ref 70–99)

## 2020-05-02 LAB — RPR: RPR Ser Ql: NONREACTIVE

## 2020-05-02 MED ORDER — CARBOPROST TROMETHAMINE 250 MCG/ML IM SOLN
250.0000 ug | Freq: Once | INTRAMUSCULAR | Status: AC
  Administered 2020-05-02: 250 ug via INTRAMUSCULAR
  Filled 2020-05-02: qty 1

## 2020-05-02 MED ORDER — ONDANSETRON HCL 4 MG/2ML IJ SOLN: 4.0000 mg | INTRAMUSCULAR | Status: DC | PRN

## 2020-05-02 MED ORDER — SIMETHICONE 80 MG PO CHEW: 80.0000 mg | CHEWABLE_TABLET | ORAL | Status: DC | PRN

## 2020-05-02 MED ORDER — COCONUT OIL OIL: 1.0000 "application " | TOPICAL_OIL | Status: DC | PRN

## 2020-05-02 MED ORDER — TRANEXAMIC ACID-NACL 1000-0.7 MG/100ML-% IV SOLN
1000.0000 mg | INTRAVENOUS | Status: AC
  Administered 2020-05-02: 1000 mg via INTRAVENOUS

## 2020-05-02 MED ORDER — ACETAMINOPHEN 325 MG PO TABS
650.0000 mg | ORAL_TABLET | Freq: Four times a day (QID) | ORAL | Status: DC
  Administered 2020-05-02 – 2020-05-03 (×6): 650 mg via ORAL
  Filled 2020-05-02 (×6): qty 2

## 2020-05-02 MED ORDER — LOPERAMIDE HCL 2 MG PO CAPS
2.0000 mg | ORAL_CAPSULE | ORAL | Status: DC | PRN
  Administered 2020-05-02 (×2): 2 mg via ORAL
  Filled 2020-05-02 (×2): qty 1

## 2020-05-02 MED ORDER — IBUPROFEN 600 MG PO TABS
600.0000 mg | ORAL_TABLET | Freq: Four times a day (QID) | ORAL | Status: DC
  Administered 2020-05-02 – 2020-05-03 (×6): 600 mg via ORAL
  Filled 2020-05-02 (×6): qty 1

## 2020-05-02 MED ORDER — FENTANYL CITRATE (PF) 100 MCG/2ML IJ SOLN
100.0000 ug | Freq: Once | INTRAMUSCULAR | Status: AC
  Administered 2020-05-02: 100 ug via INTRAVENOUS

## 2020-05-02 MED ORDER — BENZOCAINE-MENTHOL 20-0.5 % EX AERO: 1.0000 "application " | INHALATION_SPRAY | CUTANEOUS | Status: DC | PRN

## 2020-05-02 MED ORDER — DIPHENHYDRAMINE HCL 25 MG PO CAPS: 25.0000 mg | ORAL_CAPSULE | Freq: Four times a day (QID) | ORAL | Status: DC | PRN

## 2020-05-02 MED ORDER — TRANEXAMIC ACID-NACL 1000-0.7 MG/100ML-% IV SOLN
INTRAVENOUS | Status: AC
  Filled 2020-05-02: qty 100

## 2020-05-02 MED ORDER — ONDANSETRON HCL 4 MG PO TABS: 4.0000 mg | ORAL_TABLET | ORAL | Status: DC | PRN

## 2020-05-02 MED ORDER — PRENATAL MULTIVITAMIN CH
1.0000 | ORAL_TABLET | Freq: Every day | ORAL | Status: DC
  Administered 2020-05-02 – 2020-05-03 (×2): 1 via ORAL
  Filled 2020-05-02 (×2): qty 1

## 2020-05-02 MED ORDER — METHYLERGONOVINE MALEATE 0.2 MG/ML IJ SOLN
INTRAMUSCULAR | Status: AC
  Administered 2020-05-02: 0.2 mg via INTRAMUSCULAR
  Filled 2020-05-02: qty 1

## 2020-05-02 MED ORDER — SENNOSIDES-DOCUSATE SODIUM 8.6-50 MG PO TABS
2.0000 | ORAL_TABLET | Freq: Every day | ORAL | Status: DC
  Administered 2020-05-03: 2 via ORAL
  Filled 2020-05-02: qty 2

## 2020-05-02 MED ORDER — TETANUS-DIPHTH-ACELL PERTUSSIS 5-2.5-18.5 LF-MCG/0.5 IM SUSY: 0.5000 mL | PREFILLED_SYRINGE | Freq: Once | INTRAMUSCULAR | Status: DC

## 2020-05-02 MED ORDER — FENTANYL CITRATE (PF) 100 MCG/2ML IJ SOLN
INTRAMUSCULAR | Status: AC
  Filled 2020-05-02: qty 2

## 2020-05-02 MED ORDER — WITCH HAZEL-GLYCERIN EX PADS: 1.0000 "application " | MEDICATED_PAD | CUTANEOUS | Status: DC | PRN

## 2020-05-02 MED ORDER — METHYLERGONOVINE MALEATE 0.2 MG/ML IJ SOLN: 0.2000 mg | Freq: Once | INTRAMUSCULAR | Status: AC

## 2020-05-02 MED ORDER — DIBUCAINE (PERIANAL) 1 % EX OINT: 1.0000 "application " | TOPICAL_OINTMENT | CUTANEOUS | Status: DC | PRN

## 2020-05-02 NOTE — Progress Notes (Signed)
Post Partum Day 1 Subjective:  Patient is doing well without complaints. Ambulating without difficulty. Foley catheter in place and passing flatus. Tolerating PO. Abdominal pain improved. Vaginal bleeding decreased.  Objective: Blood pressure 115/67, pulse 84, temperature 99 F (37.2 C), temperature source Oral, resp. rate 17, height 5' (1.524 m), weight 74.4 kg, last menstrual period 08/01/2019, SpO2 99 %, unknown if currently breastfeeding.  Physical Exam:  General: alert, cooperative and no distress Lochia: appropriate Uterine Fundus: firm, deviated to the left Incision: n/a DVT Evaluation: No evidence of DVT seen on physical exam.  Recent Labs    05/02/20 0140 05/02/20 0456  HGB 14.0 13.5  HCT 41.3 38.8    Assessment/Plan: PPD#1 VD complicated by retained products/multiple uterine sweeps (methergine, hemabate, cyto)  -doing well this AM without complaints  -tmax 100.5 this AM 0500, otherwise VSS  -hgb 13.5, will recheck tomorrow AM, minimal blood loss  -Nexplanon @HD    -breast feeding without difficulty  -will dc foley this afternoon if clots subside and pt stable  #A2GDM  -previously on metformin  -FBGL 80  -likely T2DM, needs 2hr gtt pp  #preE w/o SF  -diagnosed on admit, p/c 1.13  -asymptomatic  -BP stable, continue to monitor  #Spanish speaking  -stratus interpreter used for entirety of encounter  Plan to discharge tomorrow AM   LOS: 1 day   05/02/2020, 9:29 AM

## 2020-05-02 NOTE — Progress Notes (Signed)
Dr Barb Merino cleared patient to be transferred to Mother Baby Unit.  [phone]    [facility] RN

## 2020-05-02 NOTE — Anesthesia Postprocedure Evaluation (Signed)
Anesthesia Post Note  Patient: Bitania Tovar-Luna  Procedure(s) Performed: AN AD HOC LABOR EPIDURAL     Patient location during evaluation: Mother Baby Anesthesia Type: Epidural Level of consciousness: awake and alert Pain management: pain level controlled Vital Signs Assessment: post-procedure vital signs reviewed and stable Respiratory status: spontaneous breathing, nonlabored ventilation and respiratory function stable Cardiovascular status: stable Postop Assessment: no headache, no backache and epidural receding Anesthetic complications: no   No complications documented.  Last Vitals:  Vitals:   05/02/20 0450 05/02/20 0645  BP: (!) 122/58   Pulse: 78   Resp: 18   Temp: (!) 38.1 C 37.1 C  SpO2: 99%     Last Pain:  Vitals:   05/02/20 0645  TempSrc: Oral  PainSc:    Pain Goal:                   Yahaira Bruski

## 2020-05-02 NOTE — Progress Notes (Signed)
Notified OB Anesthesiologist Houser about platelet count of 148. York Spaniel it was okay to remove epidural catheter.     [facility] Daijanae Rafalski RN [phone]

## 2020-05-03 ENCOUNTER — Ambulatory Visit: Payer: Self-pay

## 2020-05-03 DIAGNOSIS — Z8759 Personal history of other complications of pregnancy, childbirth and the puerperium: Secondary | ICD-10-CM

## 2020-05-03 DIAGNOSIS — U071 COVID-19: Secondary | ICD-10-CM

## 2020-05-03 DIAGNOSIS — D696 Thrombocytopenia, unspecified: Secondary | ICD-10-CM

## 2020-05-03 DIAGNOSIS — O9853 Other viral diseases complicating the puerperium: Secondary | ICD-10-CM

## 2020-05-03 DIAGNOSIS — O9913 Other diseases of the blood and blood-forming organs and certain disorders involving the immune mechanism complicating the puerperium: Secondary | ICD-10-CM

## 2020-05-03 LAB — CBC
HCT: 31.9 % — ABNORMAL LOW (ref 36.0–46.0)
Hemoglobin: 10.6 g/dL — ABNORMAL LOW (ref 12.0–15.0)
MCH: 30.2 pg (ref 26.0–34.0)
MCHC: 33.2 g/dL (ref 30.0–36.0)
MCV: 90.9 fL (ref 80.0–100.0)
Platelets: 128 10*3/uL — ABNORMAL LOW (ref 150–400)
RBC: 3.51 MIL/uL — ABNORMAL LOW (ref 3.87–5.11)
RDW: 14.6 % (ref 11.5–15.5)
WBC: 8.7 10*3/uL (ref 4.0–10.5)
nRBC: 0 % (ref 0.0–0.2)

## 2020-05-03 LAB — CULTURE, BETA STREP (GROUP B ONLY): Strep Gp B Culture: NEGATIVE

## 2020-05-03 LAB — SURGICAL PATHOLOGY

## 2020-05-03 MED ORDER — COCONUT OIL OIL: 1.0000 "application " | TOPICAL_OIL | 0 refills | Status: DC | PRN

## 2020-05-03 MED ORDER — IBUPROFEN 600 MG PO TABS: 600.0000 mg | ORAL_TABLET | Freq: Three times a day (TID) | ORAL | 0 refills | Status: DC | PRN

## 2020-05-03 MED ORDER — ACETAMINOPHEN 325 MG PO TABS: 650.0000 mg | ORAL_TABLET | Freq: Four times a day (QID) | ORAL | Status: DC | PRN

## 2020-05-03 MED ORDER — FERROUS SULFATE 325 (65 FE) MG PO TABS: 325.0000 mg | ORAL_TABLET | ORAL | 0 refills | Status: DC

## 2020-05-03 MED ORDER — FERROUS SULFATE 325 (65 FE) MG PO TABS
325.0000 mg | ORAL_TABLET | ORAL | Status: DC
  Administered 2020-05-03: 325 mg via ORAL
  Filled 2020-05-03: qty 1

## 2020-05-03 NOTE — Discharge Instructions (Signed)

## 2020-05-03 NOTE — Lactation Note (Signed)
This note was copied from a baby's chart. Lactation Consultation Note  Patient Name: Boy Analia Zuk GURKY'H Date: 05/03/2020 Reason for consult: Follow-up assessment;Late-preterm 34-36.6wks;Infant < 6lbs;Infant weight loss Age:41 hours  Visited with mom of 47 hours old LPI female < 6 lbs. She was feeding baby a bottle of Similac 22 calorie formula when entered the room; she voiced she's been putting baby to breast but has only pumped once today.   Explained to mom the importance of consistent pumping to protect her supply, especially since she self-reported a low milk supply, she only BF her other kids for 4-5 months. Baby has been using the NS # 24 to latch at the breast, but she hasn't been getting it wet. Showed mom the correct NS placement and how to align it with baby's nose.   Per mom baby has been taking about 25 ml of Similac formula. Reviewed LPI policy and informed her that since baby will be turning 48 hours inf a few minutes, the updated amounts for LPI's supplementation at this age will be 20-30 ml/ feeding.   Feeding plan:  1. Encouraged to feed baby 8-12 times/24 hours or sooner if feeding cues are present using NS # 24 2. She'll try pumping every 3 hours after feedings 3. She'll supplement every 3 hours with her own EBM first and will use Similac 22 calorie formula to complete volumes required for supplementation  No support person in mom's room at the time of [facility] consultation. She reported all questions and concerns were answered, she's aware of LC OP services and will call PRN.   Maternal Data    Feeding Mother's Current Feeding Choice: Breast Milk and Formula  LATCH Score                    Lactation Tools Discussed/Used Tools: Nipple Shields Nipple shield size: 24 Breast pump type: Double-Electric Breast Pump Pump Education: Setup, frequency, and cleaning Reason for Pumping: LPI < 6 lbs Pumping frequency: q 3 hours Pumped volume: 5  mL  Interventions Interventions: Breast feeding basics reviewed;DEBP  Discharge Pump: DEBP  Consult Status Consult Status: Follow-up Date: 05/04/20 Follow-up type: In-patient    Orvel Cutsforth Venetia Constable 05/03/2020, 10:19 PM

## 2020-05-03 NOTE — Discharge Summary (Signed)
Postpartum Discharge Summary    Patient Name: Aimee Benjamin DOB: 11/24/1980 MRN: [id number]  Date of admission: 05/01/2020 Delivery date:05/01/2020  Delivering provider: Randa Ngo  Date of discharge: 05/03/2020  Admitting diagnosis: Preterm labor [O60.00] Intrauterine pregnancy: [redacted]w[redacted]d    Secondary diagnosis:  Principal Problem:   Vaginal delivery Active Problems:   Multigravida of advanced maternal age in third trimester   History of cervical incompetence in pregnancy, currently pregnant   Gestational diabetes mellitus (GDM), antepartum   BMI 32.0-32.9,adult   Language barrier   Supervision of high risk pregnancy, antepartum   History of preterm delivery, currently pregnant   COVID-19 affecting pregnancy in third trimester   [place]ational thrombocytopenia [place]   Preterm labor   Mild preeclampsia, third trimester  Additional problems: as noted above   Discharge diagnosis: Term Vaginal Delivery                                       Post partum procedures:none Augmentation: pitocin Complications: None  Hospital course: Onset of Labor With Vaginal Delivery      40y.o. yo [id number] [id number] admitted in Latent Labor s/p PPROM on 05/01/2020. Patient had an uncomplicated labor course as follows:  Membrane Rupture Time/Date: 11:04 PM ,05/01/2020   Delivery Method:Vaginal, Spontaneous  Episiotomy: None  Lacerations:  None  Patient had an uncomplicated postpartum course.  She is ambulating, tolerating a regular diet, passing flatus, and urinating well. Patient is discharged home in stable condition on 05/03/20.  Newborn Data: Birth date:05/01/2020  Birth time:11:14 PM  Gender:Female  Living status:Living  Apgars:8 ,9  Weight:2509 g   Magnesium Sulfate received: No BMZ received: No Rhophylac:N/A MMR:N/A T-DaP:Given prenatally Flu: Yes Transfusion:No  Physical exam  Vitals:   05/02/20 1245 05/02/20 1600 05/02/20 2112 05/03/20 0522  BP: (!) 110/57 124/71 119/79  114/70  Pulse: 76 79 79 83  Resp: _0 Temp: 98.1 F (36.7 C) 98.7 F (37.1 C) 98.4 F (36.9 C) 98.4 F (36.9 C)  TempSrc: Oral Oral Oral Oral  SpO2:   99% 99%  Weight:      Height:       General: alert, cooperative and no distress Lochia: appropriate Uterine Fundus: firm Incision: N/A DVT Evaluation: No evidence of DVT seen on physical exam. No cords or calf tenderness. No significant calf/ankle edema. Labs: Lab Results  Component Value Date   WBC 8.7 05/03/2020   HGB 10.6 (L) 05/03/2020   HCT 31.9 (L) 05/03/2020   MCV 90.9 05/03/2020   PLT 128 (L) 05/03/2020   CMP Latest Ref Rng & Units 05/01/2020  Glucose 70 - 99 mg/dL 76  BUN 6 - 20 mg/dL 14  Creatinine 0.44 - 1.00 mg/dL 0.58  Sodium 135 - 145 mmol/L 136  Potassium 3.5 - 5.1 mmol/L 4.3  Chloride 98 - 111 mmol/L 109  CO2 22 - 32 mmol/L 18(L)  Calcium 8.9 - 10.3 mg/dL 8.7(L)  Total Protein 6.5 - 8.1 g/dL 6.4(L)  Total Bilirubin 0.3 - 1.2 mg/dL 0.4  Alkaline Phos 38 - 126 U/L 128(H)  AST 15 - 41 U/L 32  ALT 0 - 44 U/L 34   Edinburgh Score: Edinburgh Postnatal Depression Scale Screening Tool 05/03/2020  I have been able to laugh and see the funny side of things. 0  I have looked forward with enjoyment to things. 0  I have blamed  myself unnecessarily when things went wrong. 0  I have been anxious or worried for no good reason. 0  I have felt scared or panicky for no good reason. 0  Things have been getting on top of me. 0  I have been so unhappy that I have had difficulty sleeping. 0  I have felt sad or miserable. 0  I have been so unhappy that I have been crying. 0  The thought of harming myself has occurred to me. 0  Edinburgh Postnatal Depression Scale Total 0     After visit meds:  Allergies as of 05/03/2020      Reactions   Aspirin Shortness Of Breath, Anxiety   Pt is currently taking aspirin 81 with no problem (03/27/2020)   Penicillins Shortness Of Breath, Anxiety, Other (See Comments)   Per  pts husband pt had no swelling reaction w/this medication just SOB.   Has patient had a PCN reaction causing immediate rash, facial/tongue/throat swelling, SOB or lightheadedness with hypotension: Yes Has patient had a PCN reaction causing severe rash involving mucus membranes or skin necrosis: No Has patient had a PCN reaction that required hospitalization No Has patient had a PCN reaction occurring within the last 10 years: Yes If all of the above answers are "NO", then may proceed with Cepha      Medication List    STOP taking these medications   aspirin EC 81 MG tablet   metFORMIN 500 MG tablet Commonly known as: GLUCOPHAGE     TAKE these medications   acetaminophen 325 MG tablet Commonly known as: Tylenol Take 2 tablets (650 mg total) by mouth every 6 (six) hours as needed for mild pain, moderate pain, fever or headache. What changed:   medication strength  how much to take  reasons to take this   albuterol 108 (90 Base) MCG/ACT inhaler Commonly known as: VENTOLIN HFA Inhale 2 puffs into the lungs every 6 (six) hours.   coconut oil Oil Apply 1 application topically as needed (nipple pain).   ferrous sulfate 325 (65 FE) MG tablet Take 1 tablet (325 mg total) by mouth every other day.   ibuprofen 600 MG tablet Commonly known as: ADVIL Take 1 tablet (600 mg total) by mouth every 8 (eight) hours as needed for moderate pain or cramping.   Prenatal Vitamins 28-0.8 MG Tabs Take 1 tablet by mouth daily.        Discharge home in stable condition Infant Feeding: breast and bottle Infant Disposition:home with mother Discharge instruction: per After Visit Summary and Postpartum booklet. Activity: Advance as tolerated. Pelvic rest for 6 weeks.  Diet: routine diet Future Appointments: Future Appointments  Date Time Provider [place]  05/09/2020  2:30 PM Woodroe Mode, MD [facility]   Follow up Visit: Message sent to [facility] on 05/03/20 to schedule PP  appt.  Please schedule this patient for a In person postpartum visit in 6 weeks with the following provider: Any provider. Additional Postpartum F/U:2 hour GTT and BP check 1 week  High risk pregnancy complicated by: H5KTG, Preeclampsia w/o SF Delivery mode:  Vaginal, Spontaneous  Anticipated Birth Control:  Nexplanon at HD  Niko Jakel, Gildardo Cranker, MD OB Fellow, [facility] 05/03/2020 8:52 AM

## 2020-05-03 NOTE — Lactation Note (Signed)
This note was copied from a baby's chart. Lactation Consultation Note  Patient Name: Aimee Benjamin [id number] Date: 05/03/2020 Reason for consult: Follow-up assessment;Late-preterm 34-36.6wks;Infant < 6lbs Age:41 hours Language Line: Spanish interpreter Richardson Chiquito [phone] used for visit. Mom sitting in bed changing baby's diaper, RN in the room. Mom reports feedings at the breast typically take , followed by formula from last feeding, states baby last fed at 6:10a. Mom reports difficulty latching to the breast, reports using a nipple shield with previous children for the first 2-3weeks.  Baby with cues, mom with short everted nipples bilat, baby with difficulty achieving latch, keeps pulling off the breast in cradle and football (mom states unable to position self for cross-cradle). 61mm nipple shield applied with demonstration, baby latched without difficulty, lips flanged, breast tissue movement and few swallows noted.   Discussed cue based feedings, wake if >3hrs since last feeding, limit feedings to total, pump after feedings (mom prefers to use hand pump), clean pump and nipple shield after each use, engorgement and how to manage, [facility] brochure with numbers for LC support. Mom voiced understanding and with no further concerns. Left the room with baby still latched ~99min mark. BGilliam, RN, IBCLC  Feeding Mother's Current Feeding Choice: Breast Milk and Formula  LATCH Score Latch: Grasps breast easily, tongue down, lips flanged, rhythmical sucking.  Audible Swallowing: A few with stimulation  Type of Nipple: Everted at rest and after stimulation  Comfort (Breast/Nipple): Soft / non-tender  Hold (Positioning): Assistance needed to correctly position infant at breast and maintain latch.  LATCH Score: 8   Lactation Tools Discussed/Used Tools: Nipple Shields Nipple shield size: 24  Interventions Interventions: Breast feeding basics reviewed;Assisted with  latch;Skin to skin;Adjust position;Support pillows;Position options  Discharge    Consult Status Consult Status: Follow-up Date: 05/04/20 Follow-up type: In-patient    [name] 05/03/2020, 9:07 AM

## 2020-05-03 NOTE — Lactation Note (Addendum)
This note was copied from a baby's chart. Lactation Consultation Note Baby 53 hrs old. Received order for LC. Used Spanish interpreter [place] [phone] [facility] Language Line. Baby 36 wks and less than 6 lbs. It is mom's 4th child.  Mom has 57, 25,41 yr old and now newborn. Gave mom LPI information sheet in Spanish. Mom stated she only BF for 4 months and formula fed during that time because she didn't have much milk and it was dried up by 4 months.  RN set up DEBP. Mom stated she reviewed how to use pump. Reminded to pump every 3 hrs. Mom is supplementing w/Similac 22 cal.  Mom stated she didn't nee any help w/pumping and was doing OK w/BF. Baby is sleepy and doesn't eat much.  Mom has WIC. Asked mom if she would like LC to send a referral for DEBP mom stated no she has a hand pump at home and doesn't want DEBP. Mom will only use it while she is here.  Mom stated she didn't need any help right now w/BF. RN felt that mom needed LC to talk to her d/t small baby and 36 wks. Lactation brochure given in Spanish.  Patient Name: Aimee Benjamin [id number] Date: 05/03/2020 Reason for consult: Initial assessment;Late-preterm 34-36.6wks;Infant < 6lbs;Maternal endocrine disorder Age:72 hours  Maternal Data    Feeding Mother's Current Feeding Choice: Breast Milk and Formula Nipple Type: Nfant Slow Flow (purple)  LATCH Score Latch: Grasps breast easily, tongue down, lips flanged, rhythmical sucking.  Audible Swallowing: None  Type of Nipple: Everted at rest and after stimulation  Comfort (Breast/Nipple): Soft / non-tender  Hold (Positioning): No assistance needed to correctly position infant at breast.  LATCH Score: 8   Lactation Tools Discussed/Used Tools: Pump Breast pump type: Double-Electric Breast Pump Pump Education: Setup, frequency, and cleaning;Milk Storage Reason for Pumping: LPI/less than 6 lbs Pumping frequency: q3  Interventions Interventions: Breast feeding basics  reviewed;Skin to skin;DEBP  Discharge WIC Program: Yes  Consult Status Consult Status: Follow-up Date: 05/04/20 Follow-up type: In-patient    Charyl Dancer 05/03/2020, 2:23 AM

## 2020-05-04 ENCOUNTER — Ambulatory Visit: Payer: Self-pay

## 2020-05-04 NOTE — Lactation Note (Signed)
This note was copied from a baby's chart. Lactation Consultation Note  Patient Name: Aimee Benjamin VEHMC'N Date: 05/04/2020 Reason for consult: Follow-up assessment;Late-preterm 34-36.6wks;Infant < 6lbs;Infant weight loss;Other (Comment) (gained weight today/ [facility] Spanish interpreter - Virda adviced LC to use the I- pad , detained in MAU. [employer] interpreter [place] # [phone]) Age:41 hours  Per mom last fed at the baby at 9:45 for 15 mins and formula 10 ml.  Baby awake and rooting after the NP exam - LC offered to assist to latch and  Mom receptive. LC resized mom for NS - #20 NS 1st and baby released after 5 mins and LC noted the NS to be snug. Changed to the #24 NS , instilled the formula in the top and baby latched for 10 mins with swallows, small amount of colostrum on the NS afterwards. Mom finishing the feeding with the purple Nufant nipple. Mom aware to feed at least 30 ml at a feeding and to keep working on the Breast feeding and pumping.  Mom initially declined the [facility]  referral and today was receptive and signed the consent. LC faxed the referral.  LC also recommended and sent a referral to the [place] [facility] for [facility] to F/U.  Mom aware.  LC noted when the baby was crying a short anterior skin notch midline on the tongue and tongue mobility issues. LC brought it to the NP's attention.   Maternal Data Has patient been taught Hand Expression?: Yes  Feeding Mother's Current Feeding Choice: Breast Milk and Formula  LATCH Score Latch: Grasps breast easily, tongue down, lips flanged, rhythmical sucking.  Audible Swallowing: A few with stimulation  Type of Nipple: Everted at rest and after stimulation (areola edema - indicating shells)  Comfort (Breast/Nipple): Soft / non-tender  Hold (Positioning): Assistance needed to correctly position infant at breast and maintain latch.  LATCH Score: 8   Lactation Tools Discussed/Used Tools: Shells;Pump;Flanges Nipple shield size: 20;24  (baby accomodates the #24 NS well. LC assessed the #20 NS , was to tight when abby released) Flange Size: 24;27 Breast pump type: Manual;Double-Electric Breast Pump Pump Education: Milk Storage  Interventions Interventions: Breast feeding basics reviewed;Assisted with latch;Breast massage;Skin to skin;Hand express;Reverse pressure;Breast compression;Adjust position;Support pillows;Position options;Shells;Hand pump;DEBP  Discharge Discharge Education: Engorgement and breast care;Warning signs for feeding baby;Outpatient recommendation;Outpatient Epic message sent Pump: Manual;DEBP WIC Program: Yes  Consult Status Consult Status: Complete Date: 05/04/20    Kathrin Greathouse 05/04/2020, 11:22 AM

## 2020-05-09 ENCOUNTER — Encounter: Payer: Self-pay | Admitting: Obstetrics & Gynecology

## 2020-05-10 ENCOUNTER — Other Ambulatory Visit: Payer: Self-pay

## 2020-05-10 ENCOUNTER — Ambulatory Visit (INDEPENDENT_AMBULATORY_CARE_PROVIDER_SITE_OTHER): Payer: Self-pay | Admitting: General Practice

## 2020-05-10 VITALS — BP 131/89 | HR 78 | Ht 60.0 in | Wt 154.0 lb

## 2020-05-10 DIAGNOSIS — Z0130 Encounter for examination of blood pressure without abnormal findings: Secondary | ICD-10-CM

## 2020-05-10 DIAGNOSIS — O165 Unspecified maternal hypertension, complicating the puerperium: Secondary | ICD-10-CM

## 2020-05-10 MED ORDER — AMLODIPINE BESYLATE 5 MG PO TABS: 5.0000 mg | ORAL_TABLET | Freq: Every day | ORAL | 1 refills | Status: AC

## 2020-05-10 NOTE — Progress Notes (Signed)
Patient presents to office today for BP check following vaginal delivery on 2/13. Patient came into the hospital in labor and was noted to have mild pre-e. Patient was not sent home on any medications. Denies headaches, dizziness or blurry vision. BP today 141/92 & 131/89. Discussed with Dr Germaine Pomfret who states patient should start taking Norvasc 5mg  daily and return to office in 1 week for repeat BP check. Discussed with patient and advised she contact the office if she begins to experience new onset of symptoms. Patient verbalized understanding & will follow up in 1 week. Aimee Benjamin assisted with spanish interpretation.  RN BSN 05/10/20

## 2020-05-11 ENCOUNTER — Ambulatory Visit: Payer: Self-pay

## 2020-05-12 ENCOUNTER — Telehealth: Payer: Self-pay | Admitting: *Deleted

## 2020-05-12 NOTE — Telephone Encounter (Signed)
Pt left VM message stating that she went to the pharmacy for her blood pressure medication and they didn't have the prescription. I called [facility] and confirmed that the medication is ready for pick up. Pt was then called w/interpreter Eda Royal and was informed that the pharmacy has her medication ready to be picked up. Pt voiced understanding.

## 2020-05-17 ENCOUNTER — Ambulatory Visit (INDEPENDENT_AMBULATORY_CARE_PROVIDER_SITE_OTHER): Payer: Self-pay | Admitting: *Deleted

## 2020-05-17 ENCOUNTER — Encounter: Payer: Self-pay | Admitting: *Deleted

## 2020-05-17 ENCOUNTER — Other Ambulatory Visit: Payer: Self-pay

## 2020-05-17 VITALS — BP 109/86 | HR 87 | Ht 60.0 in | Wt 148.8 lb

## 2020-05-17 DIAGNOSIS — Z0130 Encounter for examination of blood pressure without abnormal findings: Secondary | ICD-10-CM

## 2020-05-17 NOTE — Progress Notes (Signed)
Video interpreter Peyton Najjar [phone] used for encounter. Pt presents for BP check. Per chart review, pt had pre-e during pregnancy and delivered baby on 05/05/20. She has been taking amlodipine 5 mg once daily since it was prescribed on 2/22. She denies H/A or visual disturbances.  BP - 109/86, P - 87. Pt was advised to continue taking Amlodipine as prescribed. She has PP appt w/2hr GTT on 3/29. Pt voiced understanding of all information given.

## 2020-06-14 ENCOUNTER — Encounter: Payer: Self-pay | Admitting: Medical

## 2020-06-14 ENCOUNTER — Other Ambulatory Visit: Payer: Self-pay | Admitting: Lactation Services

## 2020-06-14 ENCOUNTER — Other Ambulatory Visit: Payer: Self-pay

## 2020-06-14 ENCOUNTER — Ambulatory Visit (INDEPENDENT_AMBULATORY_CARE_PROVIDER_SITE_OTHER): Payer: Self-pay | Admitting: Medical

## 2020-06-14 VITALS — BP 117/81 | HR 75 | Wt 149.7 lb

## 2020-06-14 DIAGNOSIS — O24415 Gestational diabetes mellitus in pregnancy, controlled by oral hypoglycemic drugs: Secondary | ICD-10-CM

## 2020-06-14 DIAGNOSIS — Z789 Other specified health status: Secondary | ICD-10-CM

## 2020-06-14 DIAGNOSIS — Z6832 Body mass index (BMI) 32.0-32.9, adult: Secondary | ICD-10-CM

## 2020-06-14 DIAGNOSIS — Z8759 Personal history of other complications of pregnancy, childbirth and the puerperium: Secondary | ICD-10-CM

## 2020-06-14 DIAGNOSIS — Z8751 Personal history of pre-term labor: Secondary | ICD-10-CM

## 2020-06-14 DIAGNOSIS — Z8632 Personal history of gestational diabetes: Secondary | ICD-10-CM

## 2020-06-14 DIAGNOSIS — Z124 Encounter for screening for malignant neoplasm of cervix: Secondary | ICD-10-CM

## 2020-06-14 NOTE — Progress Notes (Signed)
    Post Partum Visit Note  Aimee Benjamin is a 40 y.o. [id number] female who presents for a postpartum visit. She is 6 weeks 2 days postpartum following a normal spontaneous vaginal delivery.  I have fully reviewed the prenatal and intrapartum course. The delivery was at 36w 4d.  Anesthesia: epidural. Postpartum course has been complicated by mild pre-eclampsia. Baby is doing well. Baby is feeding by both breast and bottle - Enfamil Neuro Pro Enfacare. Bleeding staining only. Bowel function is normal. Bladder function is normal. Patient is not sexually active. Contraception method is abstinence. Postpartum depression screening: negative.   The pregnancy intention screening data noted above was reviewed. Potential methods of contraception were discussed. The patient elected to proceed with Hormonal Implant.      The following portions of the patient's history were reviewed and updated as appropriate: allergies, current medications, past family history, past medical history, past social history, past surgical history and problem list.  Review of Systems Pertinent items are noted in HPI.    Objective:  BP 117/81   Pulse 75   Wt 149 lb 11.2 oz (67.9 kg)   LMP 08/01/2019   Breastfeeding Yes   BMI 29.24 kg/m    General:  alert and cooperative   Breasts:  deferred  Lungs: clear to auscultation bilaterally  Heart:  regular rate and rhythm, S1, S2 normal, no murmur, click, rub or gallop  Abdomen: soft, non-tender; bowel sounds normal; no masses,  no organomegaly   Vulva:  not evaluated  Vagina: not evaluated  Cervix:  not evaluated  Corpus: not examined  Adnexa:  not evaluated  Rectal Exam: Not performed.        Assessment:   Normal postpartum exam.  History of pre-eclampsia  History of GDM Unwanted fertility   Plan:   Essential components of care per ACOG recommendations:  1.  Mood and well being: Patient with negative depression screening today. Reviewed local resources for  support.  - Patient does not use tobacco.  - hx of drug use? No    2. Infant care and feeding:  -Patient currently breastmilk feeding? Yes  -Social determinants of health (SDOH) reviewed in EPIC. No concerns  3. Sexuality, contraception and birth spacing - Patient does not want a pregnancy in the next year.  Desired family size is 4 children.  - Reviewed forms of contraception in tiered fashion. Patient desired Nexplanon and will make an appointment for placement at [facility] ASAP - Discussed birth spacing of 18 months  4. Sleep and fatigue -Encouraged family/partner/community support of 4 hrs of uninterrupted sleep to help with mood and fatigue  5. Physical Recovery  - Discussed patients delivery and complications - Patient had a no laceration, perineal healing reviewed. Patient expressed understanding - Patient has urinary incontinence? No  - Patient is safe to resume physical and sexual activity  6.  Health Maintenance - Last pap smear done 2018 and was normal with negative HPV. - Patient referred to [facility] for pap smear  7. History of pre-eclampsia  - Discontinue Norvasc and recheck BP in 2 weeks   8. History of GDM  - 2 hour GTT today   Vonzella Nipple, PA-C Center for [employer], [facility]

## 2020-06-14 NOTE — Patient Instructions (Addendum)

## 2020-06-14 NOTE — Addendum Note (Signed)
Addended by: Marjo Bicker on: 06/14/2020 10:38 AM   Modules accepted: Orders

## 2020-06-15 ENCOUNTER — Telehealth: Payer: Self-pay | Admitting: Lactation Services

## 2020-06-15 LAB — GLUCOSE TOLERANCE, 2 HOURS
Glucose, 2 hour: 110 mg/dL (ref 65–139)
Glucose, GTT - Fasting: 101 mg/dL — ABNORMAL HIGH (ref 65–99)

## 2020-06-15 NOTE — Telephone Encounter (Signed)
-----   Message from Marny Lowenstein, PA-C sent at 06/15/2020 12:20 PM EDT ----- Please inform patient that her PP GTT was normal based on the PP values of 126-200. She appears to be MyChart active but told me during the appointment that she can't access her MyChart.   Thanks,  Raynelle Fanning

## 2020-06-15 NOTE — Telephone Encounter (Signed)
Called patient with assistance of [employer], Research officer, trade union.   Patient was informed her PP 2 hour GTT is normal. Patient with no questions or concerns at this time.

## 2020-06-15 NOTE — Telephone Encounter (Signed)
Opened in error

## 2020-06-28 ENCOUNTER — Ambulatory Visit (INDEPENDENT_AMBULATORY_CARE_PROVIDER_SITE_OTHER): Payer: Self-pay

## 2020-06-28 VITALS — BP 109/67 | HR 78

## 2020-06-28 DIAGNOSIS — Z0130 Encounter for examination of blood pressure without abnormal findings: Secondary | ICD-10-CM

## 2020-06-28 NOTE — Progress Notes (Signed)
Chart reviewed for nurse visit. Agree with plan of care.   Aimee Maples, MD 06/28/20 8:56 AM

## 2020-06-28 NOTE — Progress Notes (Signed)
Pt here today for BP check. Pt had recent delivery and hx of pre-eclampsia. Last BP med, Norvasc was taken 2 weeks ago per Dr Magnus Sinning.   BP today: 109/67  Pt denies any headaches, swelling or visual changes. Pt advised to ok to resume normal activity and diet. Pt advised if has any unexplained headaches, visual changes, SOB or chest pain to return to ER for evaluation. Pt agreeable and verbalized understanding.   Raquel, interpreter.   Judeth Cornfield, RN  06/28/20.

## 2023-11-28 ENCOUNTER — Other Ambulatory Visit (HOSPITAL_BASED_OUTPATIENT_CLINIC_OR_DEPARTMENT_OTHER): Payer: Self-pay | Admitting: Nurse Practitioner

## 2023-11-28 DIAGNOSIS — Z1231 Encounter for screening mammogram for malignant neoplasm of breast: Secondary | ICD-10-CM
